# Patient Record
Sex: Male | Born: 1950
Health system: Southern US, Community
[De-identification: ages and names within clinical notes are randomized; demographics above are authoritative.]

## PROBLEM LIST (undated history)

## (undated) DIAGNOSIS — T7840XA Allergy, unspecified, initial encounter: Secondary | ICD-10-CM

## (undated) DIAGNOSIS — R062 Wheezing: Secondary | ICD-10-CM

## (undated) DIAGNOSIS — I714 Abdominal aortic aneurysm, without rupture, unspecified: Secondary | ICD-10-CM

## (undated) DIAGNOSIS — R5383 Other fatigue: Secondary | ICD-10-CM

## (undated) DIAGNOSIS — C4490 Unspecified malignant neoplasm of skin, unspecified: Secondary | ICD-10-CM

## (undated) DIAGNOSIS — R0602 Shortness of breath: Secondary | ICD-10-CM

## (undated) DIAGNOSIS — R221 Localized swelling, mass and lump, neck: Secondary | ICD-10-CM

## (undated) DIAGNOSIS — R059 Cough, unspecified: Secondary | ICD-10-CM

## (undated) DIAGNOSIS — J45909 Unspecified asthma, uncomplicated: Secondary | ICD-10-CM

## (undated) DIAGNOSIS — R05 Cough: Secondary | ICD-10-CM

## (undated) HISTORY — DX: Wheezing: R06.2

## (undated) HISTORY — DX: Other fatigue: R53.83

## (undated) HISTORY — DX: Unspecified asthma, uncomplicated: J45.909

## (undated) HISTORY — PX: MOHS SURGERY: SHX181

## (undated) HISTORY — DX: Cough, unspecified: R05.9

## (undated) HISTORY — DX: Localized swelling, mass and lump, neck: R22.1

## (undated) HISTORY — DX: Unspecified malignant neoplasm of skin, unspecified: C44.90

## (undated) HISTORY — PX: BASAL CELL CARCINOMA EXCISION: SHX1214

## (undated) HISTORY — PX: OTHER SURGICAL HISTORY: SHX169

## (undated) HISTORY — PX: CATARACT EXTRACTION: SUR2

## (undated) HISTORY — DX: Abdominal aortic aneurysm, without rupture, unspecified: I71.40

## (undated) HISTORY — DX: Shortness of breath: R06.02

## (undated) HISTORY — DX: Allergy, unspecified, initial encounter: T78.40XA

---
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
## 2000-04-21 ENCOUNTER — Other Ambulatory Visit: Admission: RE | Admit: 2000-04-21 | Discharge: 2000-04-21 | Payer: Self-pay | Admitting: Family Medicine

## 2000-06-17 ENCOUNTER — Ambulatory Visit (HOSPITAL_COMMUNITY): Admission: RE | Admit: 2000-06-17 | Discharge: 2000-06-17 | Payer: Self-pay | Admitting: Family Medicine

## 2001-09-20 ENCOUNTER — Encounter: Payer: Self-pay | Admitting: Family Medicine

## 2001-09-20 ENCOUNTER — Encounter: Admission: RE | Admit: 2001-09-20 | Discharge: 2001-09-20 | Payer: Self-pay | Admitting: Family Medicine

## 2002-02-02 ENCOUNTER — Ambulatory Visit (HOSPITAL_COMMUNITY): Admission: RE | Admit: 2002-02-02 | Discharge: 2002-02-02 | Payer: Self-pay | Admitting: Gastroenterology

## 2007-10-10 ENCOUNTER — Encounter: Admission: RE | Admit: 2007-10-10 | Discharge: 2007-10-10 | Payer: Self-pay | Admitting: Family Medicine

## 2012-08-23 ENCOUNTER — Other Ambulatory Visit: Payer: Self-pay | Admitting: Family Medicine

## 2012-10-05 ENCOUNTER — Other Ambulatory Visit: Payer: Self-pay | Admitting: Dermatology

## 2012-11-18 ENCOUNTER — Other Ambulatory Visit: Payer: Self-pay | Admitting: Dermatology

## 2013-06-07 ENCOUNTER — Other Ambulatory Visit: Payer: Self-pay | Admitting: Dermatology

## 2013-12-06 ENCOUNTER — Other Ambulatory Visit: Payer: Self-pay | Admitting: Dermatology

## 2013-12-06 DIAGNOSIS — D239 Other benign neoplasm of skin, unspecified: Secondary | ICD-10-CM | POA: Diagnosis not present

## 2013-12-06 DIAGNOSIS — Z85828 Personal history of other malignant neoplasm of skin: Secondary | ICD-10-CM | POA: Diagnosis not present

## 2013-12-06 DIAGNOSIS — D485 Neoplasm of uncertain behavior of skin: Secondary | ICD-10-CM | POA: Diagnosis not present

## 2013-12-06 DIAGNOSIS — L909 Atrophic disorder of skin, unspecified: Secondary | ICD-10-CM | POA: Diagnosis not present

## 2013-12-06 DIAGNOSIS — L570 Actinic keratosis: Secondary | ICD-10-CM | POA: Diagnosis not present

## 2013-12-06 DIAGNOSIS — L919 Hypertrophic disorder of the skin, unspecified: Secondary | ICD-10-CM | POA: Diagnosis not present

## 2013-12-21 DIAGNOSIS — D0430 Carcinoma in situ of skin of unspecified part of face: Secondary | ICD-10-CM | POA: Diagnosis not present

## 2013-12-21 DIAGNOSIS — S058X9A Other injuries of unspecified eye and orbit, initial encounter: Secondary | ICD-10-CM | POA: Diagnosis not present

## 2013-12-21 DIAGNOSIS — D0439 Carcinoma in situ of skin of other parts of face: Secondary | ICD-10-CM | POA: Diagnosis not present

## 2014-01-11 DIAGNOSIS — M25569 Pain in unspecified knee: Secondary | ICD-10-CM | POA: Diagnosis not present

## 2014-01-11 DIAGNOSIS — M1710 Unilateral primary osteoarthritis, unspecified knee: Secondary | ICD-10-CM | POA: Diagnosis not present

## 2014-01-12 ENCOUNTER — Ambulatory Visit
Admission: RE | Admit: 2014-01-12 | Discharge: 2014-01-12 | Disposition: A | Discharge status: Home / Self Care | Payer: Medicare Other | Source: Ambulatory Visit | Attending: Family Medicine | Admitting: Family Medicine

## 2014-01-12 ENCOUNTER — Other Ambulatory Visit: Payer: Self-pay | Admitting: Family Medicine

## 2014-01-12 DIAGNOSIS — R059 Cough, unspecified: Secondary | ICD-10-CM | POA: Diagnosis not present

## 2014-01-12 DIAGNOSIS — J40 Bronchitis, not specified as acute or chronic: Secondary | ICD-10-CM | POA: Diagnosis not present

## 2014-01-12 DIAGNOSIS — R05 Cough: Secondary | ICD-10-CM

## 2014-01-12 DIAGNOSIS — R42 Dizziness and giddiness: Secondary | ICD-10-CM | POA: Diagnosis not present

## 2014-04-12 DIAGNOSIS — Z125 Encounter for screening for malignant neoplasm of prostate: Secondary | ICD-10-CM | POA: Diagnosis not present

## 2014-04-12 DIAGNOSIS — Z23 Encounter for immunization: Secondary | ICD-10-CM | POA: Diagnosis not present

## 2014-04-12 DIAGNOSIS — Z131 Encounter for screening for diabetes mellitus: Secondary | ICD-10-CM | POA: Diagnosis not present

## 2014-04-12 DIAGNOSIS — Z0000 Encounter for general adult medical examination without abnormal findings: Secondary | ICD-10-CM | POA: Diagnosis not present

## 2014-04-12 DIAGNOSIS — Z136 Encounter for screening for cardiovascular disorders: Secondary | ICD-10-CM | POA: Diagnosis not present

## 2014-07-18 DIAGNOSIS — Z85828 Personal history of other malignant neoplasm of skin: Secondary | ICD-10-CM | POA: Diagnosis not present

## 2014-07-18 DIAGNOSIS — L570 Actinic keratosis: Secondary | ICD-10-CM | POA: Diagnosis not present

## 2014-07-18 DIAGNOSIS — D2371 Other benign neoplasm of skin of right lower limb, including hip: Secondary | ICD-10-CM | POA: Diagnosis not present

## 2014-07-18 DIAGNOSIS — Z808 Family history of malignant neoplasm of other organs or systems: Secondary | ICD-10-CM | POA: Diagnosis not present

## 2014-07-18 DIAGNOSIS — D239 Other benign neoplasm of skin, unspecified: Secondary | ICD-10-CM | POA: Diagnosis not present

## 2014-08-17 DIAGNOSIS — R05 Cough: Secondary | ICD-10-CM | POA: Diagnosis not present

## 2014-08-17 DIAGNOSIS — J209 Acute bronchitis, unspecified: Secondary | ICD-10-CM | POA: Diagnosis not present

## 2014-08-17 DIAGNOSIS — Z720 Tobacco use: Secondary | ICD-10-CM | POA: Diagnosis not present

## 2018-02-24 ENCOUNTER — Ambulatory Visit: Payer: Medicare Other | Admitting: Internal Medicine

## 2018-02-24 ENCOUNTER — Encounter: Payer: Self-pay | Admitting: Internal Medicine

## 2018-02-24 VITALS — BP 142/90 | HR 74 | Ht 70.0 in | Wt 186.8 lb

## 2018-02-24 DIAGNOSIS — R0609 Other forms of dyspnea: Secondary | ICD-10-CM

## 2018-02-24 DIAGNOSIS — Z129 Encounter for screening for malignant neoplasm, site unspecified: Secondary | ICD-10-CM | POA: Diagnosis not present

## 2018-02-24 DIAGNOSIS — R062 Wheezing: Secondary | ICD-10-CM

## 2018-02-24 DIAGNOSIS — Z122 Encounter for screening for malignant neoplasm of respiratory organs: Secondary | ICD-10-CM

## 2018-02-24 DIAGNOSIS — R05 Cough: Secondary | ICD-10-CM | POA: Diagnosis not present

## 2018-02-24 DIAGNOSIS — R053 Chronic cough: Secondary | ICD-10-CM

## 2018-02-24 DIAGNOSIS — F17210 Nicotine dependence, cigarettes, uncomplicated: Secondary | ICD-10-CM

## 2018-02-24 DIAGNOSIS — R0600 Dyspnea, unspecified: Secondary | ICD-10-CM

## 2018-02-24 NOTE — Patient Instructions (Addendum)
ICD-10-CM   1. Dyspnea on exertion R06.09   2. Chronic cough R05   3. Wheezing R06.2   4. Smoking greater than 40 pack years F17.210   5. Cancer screening Z12.9    Dyspnea on exertion Chronic cough Wheezing  - likely you have copd  - continue symbicort - do full PFT next few weeks to assess diagnosis and severity and see if inhalers need adjustment  Smoking greater than 40 pack years Cancer screening  - do low dose CT scan for lung cancer screening next few weeks  Followup Return next few weeks but after completing PFT and  Low Dose CT

## 2018-02-24 NOTE — Progress Notes (Signed)
Subjective:    Patient ID: Mark Tate, male    DOB: [birth year], 67 y.o.   MRN: [id number]  PCP Alroy Dust, L.Marlou Sa, MD   HPI  Mark Tate 02/24/2018  Chief Complaint  Patient presents with  . Consult    Referred by [name] due to breathing issues. Pt states he mainly becomes SOB with exertion. Denies any complaints of cough or CP.   67 year old male smoker 45 pack.  Referred by [name] who is a partner Dr. Marlou Sa [facility].  Patient's brother is a Engineer, drilling in [place], [place].  He tells me that his mom died recently and the brother spent some time with him and noticed he was short of breath with exertion.  Also had chronic cough and fatigue.  Symptoms have been going on for a few years unchanged stable insidious onset no associated chest pain or radiation.  There is significant associated wheezing.  Therefore he went and saw her primary care physician's office [name].  Was started on Symbicort may be over a week ago along with prednisone and antibiotics.  He says is given some relief.  He has been taking Symbicort twice daily.  COPD CAT score is 9 based on symptomatology but we do not know if he has COPD.  Symptom severity is rated below.  There is no chest x-ray report review.  No outside records for review.  No lab test for review.    CAT COPD Symptom & Quality of Life Score (GSK trademark) 0 is no burden. 5 is highest burden 02/24/2018   Never Cough -> Cough all the time 2  No phlegm in chest -> Chest is full of phlegm 0  No chest tightness -> Chest feels very tight 0  No dyspnea for 1 flight stairs/hill -> Very dyspneic for 1 flight of stairs 4  No limitations for ADL at home -> Very limited with ADL at home 0  Confident leaving home -> Not at all confident leaving home 0  Sleep soundly -> Do not sleep soundly because of lung condition 0  Lots of Energy -> No energy at all 3  TOTAL Score (max 40)  9        has no past medical history on file.   reports  that he has been smoking cigarettes.  He has a 45.00 pack-year smoking history. He has never used smokeless tobacco.   Not on File   There is no immunization history on file for this patient.  No family history on file.   Current Outpatient Medications:  .  amoxicillin-clavulanate (AUGMENTIN) 875-125 MG tablet, , Disp: , Rfl:  .  SYMBICORT 160-4.5 MCG/ACT inhaler, , Disp: , Rfl:     Review of Systems  Constitutional: Negative for fever and unexpected weight change.  HENT: Negative for congestion, dental problem, ear pain, nosebleeds, postnasal drip, rhinorrhea, sinus pressure, sneezing, sore throat and trouble swallowing.   Eyes: Negative for redness and itching.  Respiratory: Positive for chest tightness, shortness of breath and wheezing. Negative for cough.   Cardiovascular: Negative for palpitations and leg swelling.  Gastrointestinal: Negative for nausea and vomiting.  Genitourinary: Negative for dysuria.  Musculoskeletal: Negative for joint swelling.  Skin: Negative for rash.  Allergic/Immunologic: Negative.  Negative for environmental allergies, food allergies and immunocompromised state.  Neurological: Negative for headaches.  Hematological: Bruises/bleeds easily.  Psychiatric/Behavioral: Negative for dysphoric mood. The patient is not nervous/anxious.        Objective:   Physical Exam  Constitutional:  He is oriented to person, place, and time. He appears well-developed and well-nourished. No distress.  HENT:  Head: Normocephalic and atraumatic.  Right Ear: External ear normal.  Left Ear: External ear normal.  Mouth/Throat: Oropharynx is clear and moist. No oropharyngeal exudate.  Eyes: Pupils are equal, round, and reactive to light. Conjunctivae and EOM are normal. Right eye exhibits no discharge. Left eye exhibits no discharge. No scleral icterus.  Neck: Normal range of motion. Neck supple. No JVD present. No tracheal deviation present. No thyromegaly present.    Cardiovascular: Normal rate, regular rhythm and intact distal pulses. Exam reveals no gallop and no friction rub.  No murmur heard. Pulmonary/Chest: Effort normal and breath sounds normal. No respiratory distress. He has no wheezes. He has no rales. He exhibits no tenderness.  Abdominal: Soft. Bowel sounds are normal. He exhibits no distension and no mass. There is no tenderness. There is no rebound and no guarding.  Visceral obesity +  Musculoskeletal: Normal range of motion. He exhibits no edema or tenderness.  Lymphadenopathy:    He has no cervical adenopathy.  Neurological: He is alert and oriented to person, place, and time. He has normal reflexes. No cranial nerve deficit. Coordination normal.  Skin: Skin is warm and dry. No rash noted. He is not diaphoretic. No erythema. No pallor.  Psychiatric: He has a normal mood and affect. His behavior is normal. Judgment and thought content normal.  Nursing note and vitals reviewed.  Vitals:   02/24/18 1009  BP: (!) 142/90  Pulse: 74  SpO2: 97%  Weight: 186 lb 12.8 oz (84.7 kg)  Height: 5\' 10"  (1.778 m)    Estimated body mass index is 26.8 kg/m as calculated from the following:   Height as of this encounter: 5\' 10"  (1.778 m).   Weight as of this encounter: 186 lb 12.8 oz (84.7 kg).        Assessment & Plan:     ICD-10-CM   1. Dyspnea on exertion R06.09   2. Chronic cough R05   3. Wheezing R06.2   4. Smoking greater than 40 pack years F17.210   5. Cancer screening Z12.9       Dyspnea on exertion Chronic cough Wheezing  - likely you have copd  - continue symbicort - do full PFT next few weeks to assess diagnosis and severity and see if inhalers need adjustment  Smoking greater than 40 pack years Cancer screening #lung cancer screening I discussed screening Ct chest for early detection of lung cancer Explained that in age 41-75 and smoking history, annual low dose CT chest can pick up lung cancer early and has  potential to save lives and cure lung cancer This is similar in concept to screening mammogram, colonoscopies and pap smears I explained Ct scan is low dose radiation I explained early lung cancer asymptomatic and only way to  detect is CT  With the real advantage that early lung cancer is curable through radiation or surgery I explained CT superior to CXR I explained that false positives are present and can incur cost and workup like biopsies, additional scan but benefit outweighs risk I recommend one a year  PLAN - do low dose CT scan for lung cancer screening next few weeks; he agreed  Followup Return next few weeks but after completing PFT and  Low Dose CT    Dr. Brand Males, M.D., [facility].C.P Pulmonary and Critical Care Medicine Staff Physician, [facility] Director - Interstitial Lung Disease  Program  [place] at [place], [place], [address]  Pager: [phone], If no answer or between  15:00h - 7:00h: call [phone] Telephone: [phone]

## 2018-03-09 ENCOUNTER — Ambulatory Visit (INDEPENDENT_AMBULATORY_CARE_PROVIDER_SITE_OTHER)
Admission: RE | Admit: 2018-03-09 | Discharge: 2018-03-09 | Disposition: A | Discharge status: Home / Self Care | Payer: Medicare Other | Source: Ambulatory Visit | Attending: Internal Medicine | Admitting: Internal Medicine

## 2018-03-09 DIAGNOSIS — R0609 Other forms of dyspnea: Principal | ICD-10-CM

## 2018-03-09 DIAGNOSIS — R0600 Dyspnea, unspecified: Secondary | ICD-10-CM

## 2018-03-09 DIAGNOSIS — F17210 Nicotine dependence, cigarettes, uncomplicated: Secondary | ICD-10-CM

## 2018-03-10 ENCOUNTER — Telehealth: Payer: Self-pay | Admitting: Internal Medicine

## 2018-03-10 NOTE — Telephone Encounter (Signed)
Notes recorded by Brand Males, MD on 03/10/2018 at 5:36 AM EDT CT scan without lung cancer. Has emphysema +. Will see him with PFT in July 2019 visit ----------------------------------- Spoke with pt. He is aware of results. Nothing further was needed.

## 2018-04-14 ENCOUNTER — Ambulatory Visit (INDEPENDENT_AMBULATORY_CARE_PROVIDER_SITE_OTHER): Payer: Medicare Other | Admitting: Internal Medicine

## 2018-04-14 ENCOUNTER — Other Ambulatory Visit: Payer: Medicare Other

## 2018-04-14 ENCOUNTER — Ambulatory Visit: Payer: Medicare Other | Admitting: Internal Medicine

## 2018-04-14 ENCOUNTER — Encounter: Payer: Self-pay | Admitting: Internal Medicine

## 2018-04-14 VITALS — BP 140/88 | HR 72 | Ht 66.93 in | Wt 193.8 lb

## 2018-04-14 DIAGNOSIS — F17210 Nicotine dependence, cigarettes, uncomplicated: Secondary | ICD-10-CM | POA: Diagnosis not present

## 2018-04-14 DIAGNOSIS — J449 Chronic obstructive pulmonary disease, unspecified: Secondary | ICD-10-CM | POA: Diagnosis not present

## 2018-04-14 DIAGNOSIS — Z122 Encounter for screening for malignant neoplasm of respiratory organs: Secondary | ICD-10-CM | POA: Diagnosis not present

## 2018-04-14 DIAGNOSIS — R0609 Other forms of dyspnea: Secondary | ICD-10-CM | POA: Diagnosis not present

## 2018-04-14 DIAGNOSIS — R0600 Dyspnea, unspecified: Secondary | ICD-10-CM

## 2018-04-14 LAB — PULMONARY FUNCTION TEST
DL/VA % PRED: 61 %
DL/VA: 2.69 ml/min/mmHg/L
DLCO UNC: 16.56 ml/min/mmHg
DLCO unc % pred: 69 %
FEF 25-75 POST: 1.42 L/s
FEF 25-75 Pre: 1.35 L/sec
FEF2575-%Change-Post: 5 %
FEF2575-%PRED-POST: 63 %
FEF2575-%Pred-Pre: 60 %
FEV1-%Change-Post: 0 %
FEV1-%Pred-Post: 86 %
FEV1-%Pred-Pre: 86 %
FEV1-Post: 2.52 L
FEV1-Pre: 2.51 L
FEV1FVC-%Change-Post: -1 %
FEV1FVC-%PRED-PRE: 89 %
FEV6-%Change-Post: 2 %
FEV6-%Pred-Post: 101 %
FEV6-%Pred-Pre: 99 %
FEV6-POST: 3.79 L
FEV6-Pre: 3.71 L
FEV6FVC-%CHANGE-POST: 0 %
FEV6FVC-%Pred-Post: 103 %
FEV6FVC-%Pred-Pre: 103 %
FVC-%Change-Post: 2 %
FVC-%Pred-Post: 98 %
FVC-%Pred-Pre: 95 %
FVC-Post: 3.88 L
FVC-Pre: 3.79 L
Post FEV1/FVC ratio: 65 %
Post FEV6/FVC ratio: 98 %
Pre FEV1/FVC ratio: 66 %
Pre FEV6/FVC Ratio: 98 %
RV % pred: 23 %
RV: 0.53 L
TLC % pred: 73 %
TLC: 4.69 L

## 2018-04-14 NOTE — Progress Notes (Signed)
PFT completed today. 04/14/18  

## 2018-04-14 NOTE — Progress Notes (Signed)
Subjective:    Patient ID: Mark Tate, male    DOB: Aug 11, 1951, 67 y.o.   MRN: [id number]  PCP Mark Dust, L.Mark Sa, MD   HPI   PCP Mark Dust, L.Mark Sa, MD   HPI  Mark Tate 02/24/2018  Chief Complaint  Patient presents with  . Consult    Referred by Dr. Laurann Tate due to breathing issues. Pt states he mainly becomes SOB with exertion. Denies any complaints of cough or CP.   67 year old male smoker 45 pack.  Referred by Dr. Laurann Tate who is a partner Dr. Marlou Tate [facility] practice.  Patient's brother is a Engineer, drilling in [place], [place].  He tells me that his mom died recently and the brother spent some time with him and noticed he was short of breath with exertion.  Also had chronic cough and fatigue.  Symptoms have been going on for a few years unchanged stable insidious onset no associated chest pain or radiation.  There is significant associated wheezing.  Therefore he went and saw her primary care physician's office Dr. Laurann Tate.  Was started on Symbicort may be over a week ago along with prednisone and antibiotics.  He says is given some relief.  He has been taking Symbicort twice daily.  COPD CAT score is 9 based on symptomatology but we do not know if he has COPD.  Symptom severity is rated below.  There is no chest x-ray report review.  No outside records for review.  No lab test for review.  OV 04/14/2018  Chief Complaint  Patient presents with  . Follow-up    PFT today    Follow-up shortness of breath and coughin the setting of smoking. Here to review test results. . In the interim no new complaints. He says Symbicort works well for him. He does not want to change his inhaler to anything else. He feels symptoms are actually better compared to 02/24/2018 last saw him. He had pulmonary function test today that I personally reviewed and visualized. He is isolated reduction in diffusion capacity in the setting of Symbicort. He had a low-dose CT scan of the chest For surveillance annually  has a 2 mm right upper lobe nodule and emphysema. There are no other new findings.   CAT COPD Symptom & Quality of Life Score (GSK trademark) 0 is no burden. 5 is highest burden 02/24/2018   Never Cough -> Cough all the time 2  No phlegm in chest -> Chest is full of phlegm 0  No chest tightness -> Chest feels very tight 0  No dyspnea for 1 flight stairs/hill -> Very dyspneic for 1 flight of stairs 4  No limitations for ADL at home -> Very limited with ADL at home 0  Confident leaving home -> Not at all confident leaving home 0  Sleep soundly -> Do not sleep soundly because of lung condition 0  Lots of Energy -> No energy at all 3  TOTAL Score (max 40)  9      has no past medical history on file.   reports that he has been smoking cigarettes.  He has a 45.00 pack-year smoking history. He has never used smokeless tobacco.  No past surgical history on file.  Not on File   There is no immunization history on file for this patient.  No family history on file.   Current Outpatient Medications:  .  aspirin 81 MG tablet, Take 81 mg by mouth daily., Disp: , Rfl:  .  calcium carbonate (OS-CAL - DOSED  IN MG OF ELEMENTAL CALCIUM) 1250 (500 Ca) MG tablet, Take 1 tablet by mouth daily with breakfast., Disp: , Rfl:  .  Multiple Vitamins-Minerals (CENTRUM SILVER 50+MEN) TABS, Take 1 tablet by mouth daily., Disp: , Rfl:  .  Omega-3 Fatty Acids (FISH OIL) 1000 MG CAPS, Take 1 capsule by mouth daily., Disp: , Rfl:  .  SYMBICORT 160-4.5 MCG/ACT inhaler, , Disp: , Rfl:      Review of Systems     Objective:   Physical Exam  Constitutional: He is oriented to person, place, and time. He appears well-developed and well-nourished. No distress.  HENT:  Head: Normocephalic and atraumatic.  Right Ear: External ear normal.  Left Ear: External ear normal.  Mouth/Throat: Oropharynx is clear and moist. No oropharyngeal exudate.  Eyes: Pupils are equal, round, and reactive to light. Conjunctivae  and EOM are normal. Right eye exhibits no discharge. Left eye exhibits no discharge. No scleral icterus.  Neck: Normal range of motion. Neck supple. No JVD present. No tracheal deviation present. No thyromegaly present.  Cardiovascular: Normal rate, regular rhythm and intact distal pulses. Exam reveals no gallop and no friction rub.  No murmur heard. Pulmonary/Chest: Effort normal and breath sounds normal. No respiratory distress. He has no wheezes. He has no rales. He exhibits no tenderness.  Abdominal: Soft. Bowel sounds are normal. He exhibits no distension and no mass. There is no tenderness. There is no rebound and no guarding.  Visceral obesity +  Musculoskeletal: Normal range of motion. He exhibits no edema or tenderness.  Lymphadenopathy:    He has no cervical adenopathy.  Neurological: He is alert and oriented to person, place, and time. He has normal reflexes. No cranial nerve deficit. Coordination normal.  Skin: Skin is warm and dry. No rash noted. He is not diaphoretic. No erythema. No pallor.  Psychiatric: He has a normal mood and affect. His behavior is normal. Judgment and thought content normal.  Nursing note and vitals reviewed.   Today's Vitals   04/14/18 0943  BP: 140/88  Pulse: 72  SpO2: 96%  Weight: 193 lb 12.8 oz (87.9 kg)  Height: 5' 6.93" (1.7 m)    Estimated body mass index is 30.42 kg/m as calculated from the following:   Height as of this encounter: 5' 6.93" (1.7 m).   Weight as of this encounter: 193 lb 12.8 oz (87.9 kg).       Assessment & Plan:  Stage 1 mild COPD by GOLD classification ([place]) - new diagnosis  - mild copd but needing inhaler - discussed change symbicort to spiriva but agreed that you prefer to stay on symbicort - continue Symbicort 2 puff 2 times daily - Use albuterol as needed - Check alpha 1 antitrypsin phenotype for genetic causes of COPD  Smoking greater than 40 pack years - we discussed quitting smoking in order to protect  the lungs and heart for the future - we discussed the importance of quitting smoking but have deferred the discussion until next visit - we agree that quitting smoking can be heart  Encounter for screening for malignant neoplasm of respiratory organs - no evidence of lung cancer June2019 CT scan of the chest - revealed follow-up low-dose CT scan of the chest June 2020  Follow-up 6-9 months or sooner if needed COPD cat score at follow-up    Dr. Brand Males, M.D., [facility].C.P Pulmonary and Critical Care Medicine Staff Physician, [place] Director - Interstitial Lung Disease  Program  Pulmonary Fibrosis  [place] at [place], [place], [address]  Pager: [phone], If no answer or between  15:00h - 7:00h: call [phone] Telephone: [phone]

## 2018-04-14 NOTE — Patient Instructions (Addendum)
Stage 1 mild COPD by GOLD classification ([place])  - mild copd but needing inhaler - discussed change symbicort to spiriva but agreed that you prefer to stay on symbicort - continue Symbicort 2 puff 2 times daily - Use albuterol as needed - Check alpha 1 antitrypsin phenotype for genetic causes of COPD  Smoking greater than 40 pack years - we discussed quitting smoking in order to protect the lungs and heart for the future - we discussed the importance of quitting smoking but have deferred the discussion until next visit - we agree that quitting smoking can be heart  Encounter for screening for malignant neoplasm of respiratory organs - no evidence of lung cancer June2019 CT scan of the chest - revealed follow-up low-dose CT scan of the chest June 2020  Follow-up 6-9 months or sooner if needed COPD cat score at follow-up

## 2018-04-20 LAB — ALPHA-1 ANTITRYPSIN PHENOTYPE: A1 ANTITRYPSIN SER: 136 mg/dL (ref 83–199)

## 2018-04-28 ENCOUNTER — Telehealth: Payer: Self-pay | Admitting: Internal Medicine

## 2018-04-28 NOTE — Telephone Encounter (Signed)
Brand Males, MD sent to [place], Waldemar Dickens, CMA        Alpha 1 gene is MS - This means one gene is slightly weak in protecteing lungs but other is ok. Will discuss at followup    Spoke with pt and notified of results per Dr. Chase Caller. Pt verbalized understanding and denied any questions.

## 2018-04-28 NOTE — Progress Notes (Signed)
Spoke with pt and notified of results per Dr. Ramaswamy. Pt verbalized understanding and denied any questions.  

## 2019-04-07 ENCOUNTER — Other Ambulatory Visit: Payer: Self-pay | Admitting: *Deleted

## 2019-04-07 DIAGNOSIS — Z87891 Personal history of nicotine dependence: Secondary | ICD-10-CM

## 2019-04-07 DIAGNOSIS — Z122 Encounter for screening for malignant neoplasm of respiratory organs: Secondary | ICD-10-CM

## 2019-04-07 DIAGNOSIS — F17210 Nicotine dependence, cigarettes, uncomplicated: Secondary | ICD-10-CM

## 2019-05-23 ENCOUNTER — Encounter (INDEPENDENT_AMBULATORY_CARE_PROVIDER_SITE_OTHER): Payer: Self-pay

## 2019-05-23 ENCOUNTER — Other Ambulatory Visit: Payer: Self-pay

## 2019-05-23 ENCOUNTER — Ambulatory Visit (INDEPENDENT_AMBULATORY_CARE_PROVIDER_SITE_OTHER)
Admission: RE | Admit: 2019-05-23 | Discharge: 2019-05-23 | Disposition: A | Discharge status: Home / Self Care | Payer: Medicare Other | Source: Ambulatory Visit | Attending: Internal Medicine | Admitting: Internal Medicine

## 2019-05-23 DIAGNOSIS — F17210 Nicotine dependence, cigarettes, uncomplicated: Secondary | ICD-10-CM | POA: Diagnosis not present

## 2019-05-23 DIAGNOSIS — Z87891 Personal history of nicotine dependence: Secondary | ICD-10-CM | POA: Diagnosis not present

## 2019-05-23 DIAGNOSIS — Z122 Encounter for screening for malignant neoplasm of respiratory organs: Secondary | ICD-10-CM

## 2019-11-12 ENCOUNTER — Telehealth: Payer: Self-pay | Admitting: Internal Medicine

## 2019-11-12 DIAGNOSIS — I2584 Coronary atherosclerosis due to calcified coronary lesion: Secondary | ICD-10-CM

## 2019-11-12 DIAGNOSIS — I2510 Atherosclerotic heart disease of native coronary artery without angina pectoris: Secondary | ICD-10-CM

## 2019-11-12 NOTE — Telephone Encounter (Signed)
Last seen juluy 2019 Annual CT chest for lung cancr screen - aug 2020 - no evidence of lung cancer but there is incidental findings of - co art calcificatiopn - also there in 2019 but not clearly mentioned in final report - gall stones  - also there in 2019 but was not mentioned in final report - kidney stone - also there in 2019 but not mentioned in final reort  Plan  - apologize for significant delay in this result on my behalf - do next ct chest wo contrast in august/seept 2021 - regarding   coronary artery calcification  - if no normal cardiac stress test past few years; please refer to cardiologist - [place] or [place], first available  - regarding gallstones, kidney stones -> talk to PCP [place], L.Marlou Sa, MD - likely only observation because been there long tiime

## 2019-11-13 NOTE — Telephone Encounter (Signed)
I called and spoke with the pt and notified of results and recommendations per MR  He verbalized understanding  Has never seen cards  Referral was made

## 2019-11-16 ENCOUNTER — Ambulatory Visit: Payer: Medicare Other | Admitting: Cardiovascular Disease

## 2019-11-30 ENCOUNTER — Ambulatory Visit: Payer: Medicare Other | Attending: Internal Medicine

## 2019-11-30 DIAGNOSIS — Z23 Encounter for immunization: Secondary | ICD-10-CM

## 2019-11-30 NOTE — Progress Notes (Signed)
   Covid-19 Vaccination Clinic  Name:  Mark Tate    MRN: [id number] DOB: 05-05-1951  11/30/2019  [name] was observed post Covid-19 immunization for 15 minutes without incident. He was provided with Vaccine Information Sheet and instruction to access the V-Safe system.   [name] was instructed to call 911 with any severe reactions post vaccine: Marland Kitchen Difficulty breathing  . Swelling of face and throat  . A fast heartbeat  . A bad rash all over body  . Dizziness and weakness   Immunizations Administered    Name Date Dose VIS Date Route   Pfizer COVID-19 Vaccine 11/30/2019  9:00 AM 0.3 mL 09/08/2019 Intramuscular   Manufacturer: [place]   Lot: [id number]   [place]: [id number]

## 2019-12-05 NOTE — Progress Notes (Signed)
CARDIOLOGY CONSULT NOTE       Patient ID: Mark Tate MRN: [id number] DOB/AGE: 1951-04-30 69 y.o.  Admit date: (Not on file) Referring Physician: Marlou Sa Primary Physician: Alroy Dust, L.Marlou Sa, MD Primary Cardiologist: New Reason for Consultation: CAD  Active Problems:   * No active hospital problems. *   HPI:  69 y.o. referred by Dr Marlou Sa for CAD. Smoker with 45 pack year history Seen by pulmonary Ramaswamy Rx with spiriva and symbicort Lung cancer screening CT done 05/23/19 no cancer Commented on LM coronary calcification and aortic atherosclerosis with focal dilatation of the descending thoracic aorta 4.3 cm moderate bullous emphysema This was also noted on my review of CT done 03/09/18 The calcification is very mild with no other calcium noted in the coronary arteries to my review  He has no exertional chest pain Has never had stress testing some exertional dyspnea from COPD Has had COVID vaccine Has two daughters from two marriages and 10 grand children. 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   ROS All other systems reviewed and negative except as noted above  Past Medical History:  Diagnosis Date  . Allergies   . Asthma   . COPD (chronic obstructive pulmonary disease) ([place])   . Coughing   . Fatigue   . Neck swelling   . Skin cancer   . SOB (shortness of breath) on exertion   . Wheezing     Family History  Problem Relation Age of Onset  . Cancer Mother        BONE, LUNG  . Cancer Father        COLON, PROSTATE  . Cancer - Lung Maternal Aunt     Social History   Socioeconomic History  . Marital status: Married    Spouse name: Not on file  . Number of children: Not on file  . Years of education: Not on file  . Highest education level: Not on file  Occupational History  . Not on file  Tobacco Use  . Smoking status: Current Every Day Smoker    Packs/day: 1.00    Years: 45.00    Pack years: 45.00    Types: Cigarettes  . Smokeless tobacco: Never Used    Substance and Sexual Activity  . Alcohol use: Not on file  . Drug use: Not on file  . Sexual activity: Not on file  Other Topics Concern  . Not on file  Social History Narrative  . Not on file   Social Determinants of Health   Financial Resource Strain:   . Difficulty of Paying Living Expenses:   Food Insecurity:   . Worried About Charity fundraiser in the Last Year:   . Arboriculturist in the Last Year:   Transportation Needs:   . Film/video editor (Medical):   Marland Kitchen Lack of Transportation (Non-Medical):   Physical Activity:   . Days of Exercise per Week:   . Minutes of Exercise per Session:   Stress:   . Feeling of Stress :   Social Connections:   . Frequency of Communication with Friends and Family:   . Frequency of Social Gatherings with Friends and Family:   . Attends Religious Services:   . Active Member of Clubs or Organizations:   . Attends Archivist Meetings:   Marland Kitchen Marital Status:   Intimate Partner Violence:   . Fear of Current or Ex-Partner:   . Emotionally Abused:   Marland Kitchen Physically Abused:   .  Sexually Abused:     Past Surgical History:  Procedure Laterality Date  . BASAL CELL CARCINOMA EXCISION     NECK  . CATARACT EXTRACTION    . MOHS SURGERY  2013, 2019   ON SCALP AND LEFT ARM       Current Outpatient Medications:  .  acetaminophen (TYLENOL) 500 MG tablet, Take 500 mg by mouth every 6 (six) hours as needed., Disp: , Rfl:  .  albuterol (VENTOLIN HFA) 108 (90 Base) MCG/ACT inhaler, Inhale 1 puff into the lungs every 4 (four) hours as needed for wheezing or shortness of breath., Disp: , Rfl:  .  aspirin 81 MG tablet, Take 81 mg by mouth daily., Disp: , Rfl:  .  calcium carbonate (OS-CAL - DOSED IN MG OF ELEMENTAL CALCIUM) 1250 (500 Ca) MG tablet, Take 1 tablet by mouth daily with breakfast., Disp: , Rfl:  .  cetirizine (ZYRTEC) 10 MG tablet, Take 10 mg by mouth daily., Disp: , Rfl:  .  diclofenac (VOLTAREN) 75 MG EC tablet, Take 75 mg by  mouth daily as needed., Disp: , Rfl:  .  GLUCOSAMINE CHONDROITIN COMPLX PO, Take by mouth daily., Disp: , Rfl:  .  Multiple Vitamins-Minerals (CENTRUM SILVER 50+MEN) TABS, Take 1 tablet by mouth daily., Disp: , Rfl:  .  Omega-3 Fatty Acids (FISH OIL) 1000 MG CAPS, Take 1 capsule by mouth daily., Disp: , Rfl:  .  SYMBICORT 160-4.5 MCG/ACT inhaler, , Disp: , Rfl:  .  VITAMIN D PO, Take 1,000 Units by mouth daily., Disp: , Rfl:     Physical Exam: Blood pressure (!) 162/94, pulse 75, height 5\' 6"  (1.676 m), weight 188 lb (85.3 kg), SpO2 97 %.    Affect appropriate Elderly white male  HEENT: normal Neck supple with no adenopathy JVP normal no bruits no thyromegaly Lungs emphysema with mild end expiratory wheezing  Heart:  S1/S2 no murmur, no rub, gallop or click PMI normal Abdomen: benighn, BS positve, no tenderness, no AAA no bruit.  No HSM or HJR Distal pulses intact with no bruits No edema Neuro non-focal Skin warm and dry No muscular weakness    Radiology:  See HPI regarding lung cancer screening CT;s 03/09/18 and 05/23/19   EKG: SR rate 75 nonspecific ST changes    ASSESSMENT AND PLAN:   1. Dyspnea:  Likely related to bullous emphysema and smoking F/U with [place] pulmonary discussed smoking cessation Continue inhalers   2. CAD:  Seen on CT done for lung cancer screening Isolated punctate area seen in distal LM and no where else will order lexiscan myovue to risk stratify for obstructive disease   Signed: Jenkins Rouge 12/19/2019, 9:50 AM

## 2019-12-19 ENCOUNTER — Other Ambulatory Visit: Payer: Self-pay

## 2019-12-19 ENCOUNTER — Ambulatory Visit: Payer: Medicare Other | Admitting: Cardiovascular Disease

## 2019-12-19 ENCOUNTER — Encounter: Payer: Self-pay | Admitting: Cardiovascular Disease

## 2019-12-19 ENCOUNTER — Telehealth (HOSPITAL_COMMUNITY): Payer: Self-pay

## 2019-12-19 VITALS — BP 162/94 | HR 75 | Ht 66.0 in | Wt 188.0 lb

## 2019-12-19 DIAGNOSIS — I2510 Atherosclerotic heart disease of native coronary artery without angina pectoris: Secondary | ICD-10-CM | POA: Diagnosis not present

## 2019-12-19 NOTE — Telephone Encounter (Signed)
Detailed instructions left for the patient on his answering machine per his DPR. Asked to call back with any questions. S.Aleksei Goodlin EMTP

## 2019-12-19 NOTE — Patient Instructions (Addendum)
Medication Instructions:  *If you need a refill on your cardiac medications before your next appointment, please call your pharmacy*  Lab Work: If you have labs (blood work) drawn today and your tests are completely normal, you will receive your results only by: . MyChart Message (if you have MyChart) OR . A paper copy in the mail If you have any lab test that is abnormal or we need to change your treatment, we will call you to review the results.  Testing/Procedures: Your physician has requested that you have a lexiscan myoview. For further information please visit [email or web address]. Please follow instruction sheet, as given.  Follow-Up: At [facility], you and your health needs are our priority.  As part of our continuing mission to provide you with exceptional heart care, we have created designated Provider Care Teams.  These Care Teams include your primary Cardiologist (physician) and Advanced Practice Providers (APPs -  Physician Assistants and Nurse Practitioners) who all work together to provide you with the care you need, when you need it.  We recommend signing up for the patient portal called "MyChart".  Sign up information is provided on this After Visit Summary.  MyChart is used to connect with patients for Virtual Visits (Telemedicine).  Patients are able to view lab/test results, encounter notes, upcoming appointments, etc.  Non-urgent messages can be sent to your provider as well.   To learn more about what you can do with MyChart, go to [email or web address].    Your next appointment:   As needed  The format for your next appointment:   In Person  Provider:   You may see Dr. Nishan or one of the following Advanced Practice Providers on your designated Care Team:    Lori Gerhardt, NP  Laura Ingold, NP  Jill McDaniel, NP    

## 2019-12-21 ENCOUNTER — Other Ambulatory Visit: Payer: Self-pay

## 2019-12-21 ENCOUNTER — Ambulatory Visit (HOSPITAL_COMMUNITY): Payer: Medicare Other | Attending: Cardiology

## 2019-12-21 DIAGNOSIS — I2510 Atherosclerotic heart disease of native coronary artery without angina pectoris: Secondary | ICD-10-CM | POA: Insufficient documentation

## 2019-12-21 LAB — MYOCARDIAL PERFUSION IMAGING
LV dias vol: 115 mL (ref 62–150)
LV sys vol: 62 mL
Peak HR: 98 {beats}/min
Rest HR: 68 {beats}/min
SDS: 0
SRS: 0
SSS: 0
TID: 0.91

## 2019-12-21 MED ORDER — TECHNETIUM TC 99M TETROFOSMIN IV KIT
10.9000 | PACK | Freq: Once | INTRAVENOUS | Status: AC | PRN
  Administered 2019-12-21: 10.9 via INTRAVENOUS
  Filled 2019-12-21: qty 11

## 2019-12-21 MED ORDER — REGADENOSON 0.4 MG/5ML IV SOLN
0.4000 mg | Freq: Once | INTRAVENOUS | Status: AC
  Administered 2019-12-21: 0.4 mg via INTRAVENOUS

## 2019-12-21 MED ORDER — TECHNETIUM TC 99M TETROFOSMIN IV KIT
31.9000 | PACK | Freq: Once | INTRAVENOUS | Status: AC | PRN
  Administered 2019-12-21: 31.9 via INTRAVENOUS
  Filled 2019-12-21: qty 32

## 2019-12-22 ENCOUNTER — Telehealth: Payer: Self-pay

## 2019-12-22 DIAGNOSIS — R931 Abnormal findings on diagnostic imaging of heart and coronary circulation: Secondary | ICD-10-CM

## 2019-12-22 NOTE — Telephone Encounter (Signed)
-----   Message from [name], MD sent at 12/22/2019  8:25 AM EDT ----- Myovue suggests old infarct f/u echo for EF and RWMA then see me or PA in f/u

## 2019-12-22 NOTE — Telephone Encounter (Signed)
Called patient about his test results. Patient is going to have echo on 01/01/20, first available and a virtual visit with Cecilie Kicks NP on 01/02/20.     Virtual Visit Pre-Appointment Phone Call  "(Name), I am calling you today to discuss your upcoming appointment. We are currently trying to limit exposure to the virus that causes COVID-19 by seeing patients at home rather than in the office."  1. "What is the BEST phone number to call the day of the visit?" - include this in appointment notes  2. "Do you have or have access to (through a family member/friend) a smartphone with video capability that we can use for your visit?" a. If yes - list this number in appt notes as "cell" (if different from BEST phone #) and list the appointment type as a VIDEO visit in appointment notes b. If no - list the appointment type as a PHONE visit in appointment notes  3. Confirm consent - "In the setting of the current Covid19 crisis, you are scheduled for a (phone or video) visit with your provider on (date) at (time).  Just as we do with many in-office visits, in order for you to participate in this visit, we must obtain consent.  If you'd like, I can send this to your mychart (if signed up) or email for you to review.  Otherwise, I can obtain your verbal consent now.  All virtual visits are billed to your insurance company just like a normal visit would be.  By agreeing to a virtual visit, we'd like you to understand that the technology does not allow for your provider to perform an examination, and thus may limit your provider's ability to fully assess your condition. If your provider identifies any concerns that need to be evaluated in person, we will make arrangements to do so.  Finally, though the technology is pretty good, we cannot assure that it will always work on either your or our end, and in the setting of a video visit, we may have to convert it to a phone-only visit.  In either situation, we cannot  ensure that we have a secure connection.  Are you willing to proceed? Yes  4. Advise patient to be prepared - "Two hours prior to your appointment, go ahead and check your blood pressure, pulse, oxygen saturation, and your weight (if you have the equipment to check those) and write them all down. When your visit starts, your provider will ask you for this information. If you have an Apple Watch or Kardia device, please plan to have heart rate information ready on the day of your appointment. Please have a pen and paper handy nearby the day of the visit as well."  5. Give patient instructions for MyChart download to smartphone OR Doximity/Doxy.me as below if video visit (depending on what platform provider is using)  6. Inform patient they will receive a phone call 15 minutes prior to their appointment time (may be from unknown caller ID) so they should be prepared to answer    TELEPHONE CALL NOTE  Mark Tate has been deemed a candidate for a follow-up tele-health visit to limit community exposure during the Covid-19 pandemic. I spoke with the patient via phone to ensure availability of phone/video source, confirm preferred email & phone number, and discuss instructions and expectations.  I reminded Mark Tate to be prepared with any vital sign and/or heart rhythm information that could potentially be obtained via home monitoring, at the time  of his visit. I reminded Mark Tate to expect a phone call prior to his visit.  Michaelyn Barter, RN 12/22/2019 9:47 AM   INSTRUCTIONS FOR DOWNLOADING THE MYCHART APP TO SMARTPHONE  - The patient must first make sure to have activated MyChart and know their login information - If Apple, go to [employer] and type in MyChart in the search bar and download the app. If Android, ask patient to go to [employer] and type in [place] in the search bar and download the app. The app is free but as with any other app downloads, their phone may require  them to verify saved payment information or Apple/Android password.  - The patient will need to then log into the app with their MyChart username and password, and select [facility] as their healthcare provider to link the account. When it is time for your visit, go to the MyChart app, find appointments, and click Begin Video Visit. Be sure to Select Allow for your device to access the Microphone and Camera for your visit. You will then be connected, and your provider will be with you shortly.  **If they have any issues connecting, or need assistance please contact MyChart service desk [phone])**  **If using a computer, in order to ensure the best quality for their visit they will need to use either of the following Internet Browsers: [employer], or Google Chrome**  IF USING DOXIMITY or DOXY.ME - The patient will receive a link just prior to their visit by text.     FULL LENGTH CONSENT FOR TELE-HEALTH VISIT   I hereby voluntarily request, consent and authorize [facility] and its employed or contracted physicians, physician assistants, nurse practitioners or other licensed health care professionals (the Practitioner), to provide me with telemedicine health care services (the "Services") as deemed necessary by the treating Practitioner. I acknowledge and consent to receive the Services by the Practitioner via telemedicine. I understand that the telemedicine visit will involve communicating with the Practitioner through live audiovisual communication technology and the disclosure of certain medical information by electronic transmission. I acknowledge that I have been given the opportunity to request an in-person assessment or other available alternative prior to the telemedicine visit and am voluntarily participating in the telemedicine visit.  I understand that I have the right to withhold or withdraw my consent to the use of telemedicine in the course of my care at any  time, without affecting my right to future care or treatment, and that the Practitioner or I may terminate the telemedicine visit at any time. I understand that I have the right to inspect all information obtained and/or recorded in the course of the telemedicine visit and may receive copies of available information for a reasonable fee.  I understand that some of the potential risks of receiving the Services via telemedicine include:  Marland Kitchen Delay or interruption in medical evaluation due to technological equipment failure or disruption; . Information transmitted may not be sufficient (e.g. poor resolution of images) to allow for appropriate medical decision making by the Practitioner; and/or  . In rare instances, security protocols could fail, causing a breach of personal health information.  Furthermore, I acknowledge that it is my responsibility to provide information about my medical history, conditions and care that is complete and accurate to the best of my ability. I acknowledge that Practitioner's advice, recommendations, and/or decision may be based on factors not within their control, such as incomplete or inaccurate data provided  by me or distortions of diagnostic images or specimens that may result from electronic transmissions. I understand that the practice of medicine is not an exact science and that Practitioner makes no warranties or guarantees regarding treatment outcomes. I acknowledge that I will receive a copy of this consent concurrently upon execution via email to the email address I last provided but may also request a printed copy by calling the office of [facility].    I understand that my insurance will be billed for this visit.   I have read or had this consent read to me. . I understand the contents of this consent, which adequately explains the benefits and risks of the Services being provided via telemedicine.  . I have been provided ample opportunity to ask questions  regarding this consent and the Services and have had my questions answered to my satisfaction. . I give my informed consent for the services to be provided through the use of telemedicine in my medical care  By participating in this telemedicine visit I agree to the above.

## 2019-12-26 ENCOUNTER — Ambulatory Visit: Payer: Medicare Other | Attending: Internal Medicine

## 2019-12-26 DIAGNOSIS — Z23 Encounter for immunization: Secondary | ICD-10-CM

## 2019-12-26 NOTE — Progress Notes (Signed)
   Covid-19 Vaccination Clinic  Name:  Mark Tate    MRN: [id number] DOB: August 22, 1951  12/26/2019  [name] was observed post Covid-19 immunization for 15 minutes without incident. He was provided with Vaccine Information Sheet and instruction to access the V-Safe system.   [name] was instructed to call 911 with any severe reactions post vaccine: Marland Kitchen Difficulty breathing  . Swelling of face and throat  . A fast heartbeat  . A bad rash all over body  . Dizziness and weakness   Immunizations Administered    Name Date Dose VIS Date Route   Pfizer COVID-19 Vaccine 12/26/2019  1:32 PM 0.3 mL 09/08/2019 Intramuscular   Manufacturer: [place]   Lot: [id number]   [place]: [id number]

## 2020-01-01 ENCOUNTER — Telehealth: Payer: Self-pay

## 2020-01-01 ENCOUNTER — Ambulatory Visit (HOSPITAL_COMMUNITY): Payer: Medicare Other | Attending: Cardiovascular Disease

## 2020-01-01 ENCOUNTER — Other Ambulatory Visit: Payer: Self-pay

## 2020-01-01 DIAGNOSIS — R931 Abnormal findings on diagnostic imaging of heart and coronary circulation: Secondary | ICD-10-CM | POA: Diagnosis not present

## 2020-01-01 DIAGNOSIS — I2510 Atherosclerotic heart disease of native coronary artery without angina pectoris: Secondary | ICD-10-CM | POA: Diagnosis not present

## 2020-01-01 DIAGNOSIS — I517 Cardiomegaly: Secondary | ICD-10-CM | POA: Insufficient documentation

## 2020-01-01 DIAGNOSIS — J449 Chronic obstructive pulmonary disease, unspecified: Secondary | ICD-10-CM | POA: Insufficient documentation

## 2020-01-01 DIAGNOSIS — I77810 Thoracic aortic ectasia: Secondary | ICD-10-CM | POA: Diagnosis not present

## 2020-01-01 DIAGNOSIS — Z79899 Other long term (current) drug therapy: Secondary | ICD-10-CM

## 2020-01-01 MED ORDER — CARVEDILOL 3.125 MG PO TABS: 3.1250 mg | ORAL_TABLET | Freq: Two times a day (BID) | ORAL | 3 refills | Status: DC

## 2020-01-01 MED ORDER — ENTRESTO 24-26 MG PO TABS: 1.0000 | ORAL_TABLET | Freq: Two times a day (BID) | ORAL | 11 refills | Status: DC

## 2020-01-01 NOTE — Addendum Note (Signed)
Addended by: Aris Georgia, Klayten Jolliff L on: 01/01/2020 12:16 PM   Modules accepted: Orders

## 2020-01-01 NOTE — Telephone Encounter (Addendum)
Patient aware of results. Will send entresto and coreg to patient's pharmacy. Patient has virtual appointment tomorrow with Cecilie Kicks NP. Will have patient keep appointment to go over questions and concerns. Patient will still come up to the office for lab work BMET and BNP, and he will need to pick up entresto card for free 30 days. Scheduled patient for 01/22/20 to see Pharmacy and get lab work done. Patient has appointment on 02/12/20 for office visit with Cecilie Kicks NP to discuss heart cath. Will leave letter at front desk with entresto card and a letter with all his appointment on it.

## 2020-01-01 NOTE — Telephone Encounter (Signed)
-----   Message from [name], MD sent at 01/01/2020 11:12 AM EDT ----- EF down will need right and left cath after on medical Rx start low dose entresto and coreg 3.125 bid f/u pharm D to titrate in 3 weeks f/u with me or PA 6 weeks to discuss cath Needs BMET and BNP now and in 3 weeks

## 2020-01-01 NOTE — Progress Notes (Signed)
Virtual Visit via Video Note   This visit type was conducted due to national recommendations for restrictions regarding the COVID-19 Pandemic (e.g. social distancing) in an effort to limit this patient's exposure and mitigate transmission in our community.  Due to his co-morbid illnesses, this patient is at least at moderate risk for complications without adequate follow up.  This format is felt to be most appropriate for this patient at this time.  All issues noted in this document were discussed and addressed.  A limited physical exam was performed with this format.  Please refer to the patient's chart for his consent to telehealth for [facility]   The patient was identified using 2 identifiers.  Date:  01/01/2020   ID:  [name], DOB June 01, 1951, MRN [id number]  Patient Location: Other:  family house Provider Location: Office  PCP:  Alroy Dust, Carlean Jews.Marlou Sa, MD  Cardiologist:  Jenkins Rouge, MD  Electrophysiologist:  None   Evaluation Performed:  Follow-Up Visit  Chief Complaint:  Cardiomyopathy   History of Present Illness:    ORIE WORSTELL is a 69 y.o. male with CAD on CT of chest. Smoker with 45 pack year history Seen by pulmonary Ramaswamy Rx with spiriva and symbicort Lung cancer screening CT done 05/23/19 no cancer Commented on LM coronary calcification and aortic atherosclerosis with focal dilatation of the descending thoracic aorta 4.3 cm moderate bullous emphysema This was also noted on my review of CT done 03/09/18 The calcification is very mild with no other calcium noted in the coronary arteries to my review  He has no exertional chest pain Has never had stress testing some exertional dyspnea from COPD Has had COVID vaccine Has two daughters from two marriages and 10 grand children. 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    Asked to stop tobacco.   CAD seen on CT for lung cancer screening.  Only in distal LM, stress test ordered. Echo also  myoview suggest old  infarct  Echo with decreased, EF to 30-35%, G1 DD   Plan per Dr. Doreen Beam entrestro and coreg and increase in 3 weeks, follow up in 6 weeks to discuss Rt and Lt cardiac cath.   Aortic dilatation noted of ascending aorta at 39 mm  Discussed with pt and his sister was present for call. His results and plan to improve EF.  Discussed meds and eventual need for cath.   All questions answered. Pt has entresto and coreg at home.    The patient does not have symptoms concerning for COVID-19 infection (fever, chills, cough, or new shortness of breath).    Past Medical History:  Diagnosis Date  . Allergies   . Asthma   . COPD (chronic obstructive pulmonary disease) ([place])   . Coughing   . Fatigue   . Neck swelling   . Skin cancer   . SOB (shortness of breath) on exertion   . Wheezing    Past Surgical History:  Procedure Laterality Date  . BASAL CELL CARCINOMA EXCISION     NECK  . CATARACT EXTRACTION    . MOHS SURGERY  2013, 2019   ON SCALP AND LEFT ARM      No outpatient medications have been marked as taking for the 01/02/20 encounter (Appointment) with [name], NP.     Allergies:   Patient has no known allergies.   Social History   Tobacco Use  . Smoking status: Current Every Day Smoker    Packs/day: 1.00  Years: 45.00    Pack years: 45.00    Types: Cigarettes  . Smokeless tobacco: Never Used  Substance Use Topics  . Alcohol use: Not on file  . Drug use: Not on file     Family Hx: The patient's family history includes Cancer in his father and mother; Cancer - Lung in his maternal aunt.  ROS:   Please see the history of present illness.    General:no colds or fevers, no weight changes Skin:no rashes or ulcers HEENT:no blurred vision, no congestion CV:see HPI PUL:see HPI GI:no diarrhea constipation or melena, no indigestion GU:no hematuria, no dysuria MS:no joint pain, no claudication Neuro:no syncope, no lightheadedness Endo:no diabetes, no thyroid  disease  All other systems reviewed and are negative.   Prior CV studies:   The following studies were reviewed today: ECHO 01/01/20 IMPRESSIONS    1. Left ventricular ejection fraction, by estimation, is 30 to 35%. The  left ventricle has moderately decreased function. The left ventricle  demonstrates global hypokinesis. The left ventricular internal cavity size  was mildly dilated. Left ventricular  diastolic parameters are consistent with Grade I diastolic dysfunction  (impaired relaxation).  2. Right ventricular systolic function is normal. The right ventricular  size is normal.  3. The mitral valve is normal in structure. No evidence of mitral valve  regurgitation. No evidence of mitral stenosis.  4. The aortic valve is normal in structure. Aortic valve regurgitation is  not visualized. No aortic stenosis is present.  5. Aortic dilatation noted. There is mild dilatation of the ascending  aorta measuring 39 mm.   FINDINGS  Left Ventricle: Left ventricular ejection fraction, by estimation, is 30  to 35%. The left ventricle has moderately decreased function. The left  ventricle demonstrates global hypokinesis. The left ventricular internal  cavity size was mildly dilated.  There is no left ventricular hypertrophy. Left ventricular diastolic  parameters are consistent with Grade I diastolic dysfunction (impaired  relaxation).   Right Ventricle: The right ventricular size is normal. No increase in  right ventricular wall thickness. Right ventricular systolic function is  normal.   Left Atrium: Left atrial size was normal in size.   Right Atrium: Right atrial size was normal in size.   Pericardium: There is no evidence of pericardial effusion.   Mitral Valve: The mitral valve is normal in structure. No evidence of  mitral valve regurgitation. No evidence of mitral valve stenosis.   Tricuspid Valve: The tricuspid valve is grossly normal. Tricuspid valve    regurgitation is trivial. No evidence of tricuspid stenosis.   Aortic Valve: The aortic valve is normal in structure. Aortic valve  regurgitation is not visualized. No aortic stenosis is present.   Pulmonic Valve: The pulmonic valve was normal in structure. Pulmonic valve  regurgitation is not visualized. No evidence of pulmonic stenosis.   Aorta: Aortic dilatation noted. There is mild dilatation of the ascending  aorta measuring 39 mm.   IAS/Shunts: The atrial septum is grossly normal.   NUC study 12/20/19  There was no ST segment deviation noted during stress.  Hypertensive with resting BP 200/114  The left ventricular ejection fraction is mildly decreased (45-54%).  Nuclear stress EF: 46%.  Defect 1: There is a small defect of mild severity present in the apex location.  Defect 2: There is a small defect of mild severity present in the basal inferior, mid inferior and apical inferior location.  Findings consistent with prior myocardial infarction.  This is an intermediate risk  study due to decreased systolic function. No ischemia.   Fixed perfusion defects at apex and inferior wall suggestive of prior infarct.  No ischemia.   Labs/Other Tests and Data Reviewed:    EKG:  An ECG dated 12/19/19 was personally reviewed today and demonstrated:  SR at 53 and no acute ST changes  Recent Labs: No results found for requested labs within last 8760 hours.   Recent Lipid Panel No results found for: CHOL, TRIG, HDL, CHOLHDL, LDLCALC, LDLDIRECT  Wt Readings from Last 3 Encounters:  12/21/19 188 lb (85.3 kg)  12/19/19 188 lb (85.3 kg)  04/14/18 193 lb 12.8 oz (87.9 kg)     Objective:    Vital Signs:  There were no vitals taken for this visit.   VITAL SIGNS:  reviewed Neuro alert and oriented and answers questions approp Pulmonary can speak in complete sentences without SOB General NAD  ASSESSMENT & PLAN:    1. Cardiomyopathy on Echo with EF 30-35%; dilated LV,  G1DD--add entresto and coreg today check BMP and BNP.  Return in 2 -3 weeks for labs and increase entresto,  In 6 weeks return to discuss Lt and Rt cardiac cath. 2.  CAD on CT of chest - nuc study with no ischemia but old scar - concerning for old MI, on CT distal LAD disease- will plan cath once titrated meds. Will add ASA 81 mg and statin 3. Tobacco use, he understands he needs to stop will begin cutting back 4. COPD/Dyspnea followed by Pulmonary   COVID-19 Education: The signs and symptoms of COVID-19 were discussed with the patient and how to seek care for testing (follow up with PCP or arrange E-visit).  The importance of social distancing was discussed today.  Time:   Today, I have spent 8 minutes with the patient with telehealth technology discussing the above problems.     Medication Adjustments/Labs and Tests Ordered: Current medicines are reviewed at length with the patient today.  Concerns regarding medicines are outlined above.   Tests Ordered: No orders of the defined types were placed in this encounter.   Medication Changes: No orders of the defined types were placed in this encounter.   Follow Up:  In Person in 3 week(s)  And 6 weeks with Dr. Johnsie Cancel or APP  Signed, Cecilie Kicks, NP  01/01/2020 10:23 PM    [place]

## 2020-01-02 ENCOUNTER — Other Ambulatory Visit: Payer: Medicare Other

## 2020-01-02 ENCOUNTER — Encounter: Payer: Self-pay | Admitting: Cardiology

## 2020-01-02 ENCOUNTER — Telehealth (INDEPENDENT_AMBULATORY_CARE_PROVIDER_SITE_OTHER): Payer: Medicare Other | Admitting: Cardiology

## 2020-01-02 VITALS — Ht 66.0 in | Wt 188.0 lb

## 2020-01-02 DIAGNOSIS — I420 Dilated cardiomyopathy: Secondary | ICD-10-CM

## 2020-01-02 DIAGNOSIS — I2510 Atherosclerotic heart disease of native coronary artery without angina pectoris: Secondary | ICD-10-CM

## 2020-01-02 DIAGNOSIS — Z79899 Other long term (current) drug therapy: Secondary | ICD-10-CM

## 2020-01-02 DIAGNOSIS — J449 Chronic obstructive pulmonary disease, unspecified: Secondary | ICD-10-CM | POA: Diagnosis not present

## 2020-01-02 DIAGNOSIS — Z720 Tobacco use: Secondary | ICD-10-CM | POA: Diagnosis not present

## 2020-01-02 DIAGNOSIS — R931 Abnormal findings on diagnostic imaging of heart and coronary circulation: Secondary | ICD-10-CM

## 2020-01-02 NOTE — Patient Instructions (Signed)
Medication Instructions:  Your physician recommends that you continue on your current medications as directed. Please refer to the Current Medication list given to you today.  *If you need a refill on your cardiac medications before your next appointment, please call your pharmacy*   Lab Work: TODAY:  COME TO THE OFFICE FOR LAB 4/26:  COME BACK TO THE OFFICE FOR LAB  If you have labs (blood work) drawn today and your tests are completely normal, you will receive your results only by: Marland Kitchen MyChart Message (if you have MyChart) OR . A paper copy in the mail If you have any lab test that is abnormal or we need to change your treatment, we will call you to review the results.   Testing/Procedures: None ordered   Follow-Up: At [facility], you and your health needs are our priority.  As part of our continuing mission to provide you with exceptional heart care, we have created designated Provider Care Teams.  These Care Teams include your primary Cardiologist (physician) and Advanced Practice Providers (APPs -  Physician Assistants and Nurse Practitioners) who all work together to provide you with the care you need, when you need it.  We recommend signing up for the patient portal called "MyChart".  Sign up information is provided on this After Visit Summary.  MyChart is used to connect with patients for Virtual Visits (Telemedicine).  Patients are able to view lab/test results, encounter notes, upcoming appointments, etc.  Non-urgent messages can be sent to your provider as well.   To learn more about what you can do with MyChart, go to [email or web address].    Your next appointment:   You have several appointments already scheduled.  PLEASE KEEP THEM ALL

## 2020-01-03 ENCOUNTER — Encounter: Payer: Self-pay | Admitting: Cardiology

## 2020-01-03 DIAGNOSIS — Z87891 Personal history of nicotine dependence: Secondary | ICD-10-CM | POA: Insufficient documentation

## 2020-01-03 DIAGNOSIS — I420 Dilated cardiomyopathy: Secondary | ICD-10-CM | POA: Insufficient documentation

## 2020-01-03 DIAGNOSIS — I2510 Atherosclerotic heart disease of native coronary artery without angina pectoris: Secondary | ICD-10-CM

## 2020-01-03 DIAGNOSIS — J449 Chronic obstructive pulmonary disease, unspecified: Secondary | ICD-10-CM | POA: Insufficient documentation

## 2020-01-03 DIAGNOSIS — Z720 Tobacco use: Secondary | ICD-10-CM

## 2020-01-03 HISTORY — DX: Atherosclerotic heart disease of native coronary artery without angina pectoris: I25.10

## 2020-01-03 HISTORY — DX: Chronic obstructive pulmonary disease, unspecified: J44.9

## 2020-01-03 HISTORY — DX: Tobacco use: Z72.0

## 2020-01-03 LAB — BASIC METABOLIC PANEL
BUN/Creatinine Ratio: 11 (ref 10–24)
BUN: 13 mg/dL (ref 8–27)
CO2: 21 mmol/L (ref 20–29)
Calcium: 9.4 mg/dL (ref 8.6–10.2)
Chloride: 103 mmol/L (ref 96–106)
Creatinine, Ser: 1.18 mg/dL (ref 0.76–1.27)
GFR calc Af Amer: 71 mL/min/{1.73_m2} (ref >59)
GFR calc non Af Amer: 62 mL/min/{1.73_m2} (ref >59)
Glucose: 96 mg/dL (ref 65–99)
Potassium: 4.6 mmol/L (ref 3.5–5.2)
Sodium: 140 mmol/L (ref 134–144)

## 2020-01-03 LAB — PRO B NATRIURETIC PEPTIDE: NT-Pro BNP: 218 pg/mL (ref 0–376)

## 2020-01-04 ENCOUNTER — Telehealth: Payer: Self-pay | Admitting: *Deleted

## 2020-01-04 DIAGNOSIS — I2510 Atherosclerotic heart disease of native coronary artery without angina pectoris: Secondary | ICD-10-CM

## 2020-01-04 MED ORDER — ATORVASTATIN CALCIUM 40 MG PO TABS: 40.0000 mg | ORAL_TABLET | Freq: Every day | ORAL | 3 refills | Status: DC

## 2020-01-04 MED ORDER — ASPIRIN EC 81 MG PO TBEC: 81.0000 mg | DELAYED_RELEASE_TABLET | Freq: Every day | ORAL | 3 refills | Status: AC

## 2020-01-04 NOTE — Telephone Encounter (Signed)
-----   Message from [name], NP sent at 01/03/2020  9:33 PM EDT ----- Let's add asprin 81 mg daily and Lipitor 40 mg daily with next labs add lipids and hepatic  thanks.

## 2020-01-04 NOTE — Telephone Encounter (Signed)
Called pt re: message below.  Pt has been made aware to start Aspirin 81 and Atorvastatin 40 mg daily and we will draw fasting lipid / lft on his f/u appt 02/12/2020 and pt has been made aware to come fasting.  Pt verbalized understanding.

## 2020-01-21 NOTE — Progress Notes (Signed)
Patient ID: Mark Tate                 DOB: 1950/11/07                      MRN: [id number]     HPI: Mark Tate is a 69 y.o. male patient of Dr. Kyla Balzarine referred by Dr. Cecilie Kicks, NP to HTN clinic. PMH is significant for cardiomyopathy with EF 30-35%, LM calcification and aortic atherosclerosis on chest CT, COPD/dyspnea followed by pulmonary, and tobacco use.  Patient was referred to Dr. Johnsie Cancel and was seen 12/19/19 after CAD found on chest CT by pulmonologist, and patient was last seen in office on 12/19/19 where BP was 162/94, HR 75. ECHO on 01/01/20 also showed reduced EF and findings consistent with prior MI, so he was started on Entresto and carvedilol. Stress test on 12/21/19 showed findings consistent to a prior MI, so he was started on aspirin 81mg  daily and atorvastatin 40mg  daily when he was seen by Cecilie Kicks on 01/02/20 via telemedicine visit.  Patient arrives today in good spirits for initial visit. He has been tolerating his new medications well and denies any adverse effects. He feels that his SOB has improved slightly and denies any edema, orthopnea, or lightheadedness/dizziness. He does not measure his BP at home but is willing to get a BP cuff to monitor his BP daily. He also does not weigh himself daily but has a scale at home. 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 He tries to limit his salt intake but does eat foods high in saturated fats and drinks 2-3 cups of coffee and coke every day. He continues to smoke daily and is not ready to quit at this time.  Current HF/HTN meds: carvedilol 3.125 mg BID, Entresto 24/26 mg BID Previously tried: none BP goal: < 130/80 mmHg  Current lipid-lowering meds: atorvastatin 40mg  daily, omega-3 fatty acids 1g daily Intolerances: none LDL goal: <70 mg/dL  Family History: The patient's family history includes Cancer in his father and mother; Cancer -  Lung in his maternal aunt.  Social History: current smoker (1 PPD, 45 years)  Diet: Does not eat out frequently. For breakfast, ate sausage, eggs, and bacon. Ate a sandwich for lunch, and [employer] for dinner. Drinks 2-3 cups of coffee and ~1 can of Coke daily.   Exercise: remodeling house; not much activity with covid  Home BP readings: does not have a BP cuff  Labs: 01/02/20: Scr 1.18, K 4.6, BNP 218   Wt Readings from Last 3 Encounters:  01/02/20 188 lb (85.3 kg)  12/21/19 188 lb (85.3 kg)  12/19/19 188 lb (85.3 kg)   BP Readings from Last 3 Encounters:  12/19/19 (!) 162/94  04/14/18 140/88  02/24/18 (!) 142/90   Pulse Readings from Last 3 Encounters:  12/19/19 75  04/14/18 72  02/24/18 74    Renal function: CrCl cannot be calculated (Unknown ideal weight.).  Past Medical History:  Diagnosis Date  . Allergies   . Asthma   . CAD in native artery 01/03/2020  . COPD (chronic obstructive pulmonary disease) ([place])   . COPD (chronic obstructive pulmonary disease) ([place]) 01/03/2020  . Coughing   . Fatigue   . Neck swelling   . Skin cancer   . SOB (shortness of breath) on exertion   . Tobacco use 01/03/2020  . Wheezing  Current Outpatient Medications on File Prior to Visit  Medication Sig Dispense Refill  . acetaminophen (TYLENOL) 500 MG tablet Take 500 mg by mouth every 6 (six) hours as needed.    Marland Kitchen albuterol (VENTOLIN HFA) 108 (90 Base) MCG/ACT inhaler Inhale 1 puff into the lungs every 4 (four) hours as needed for wheezing or shortness of breath.    Marland Kitchen aspirin EC 81 MG tablet Take 1 tablet (81 mg total) by mouth daily. 90 tablet 3  . atorvastatin (LIPITOR) 40 MG tablet Take 1 tablet (40 mg total) by mouth daily. 90 tablet 3  . calcium carbonate (OS-CAL - DOSED IN MG OF ELEMENTAL CALCIUM) 1250 (500 Ca) MG tablet Take 1 tablet by mouth daily with breakfast.    . carvedilol (COREG) 3.125 MG tablet Take 1 tablet (3.125 mg total) by mouth 2 (two) times daily with a meal. 180 tablet  3  . cetirizine (ZYRTEC) 10 MG tablet Take 10 mg by mouth daily.    Marland Kitchen GLUCOSAMINE CHONDROITIN COMPLX PO Take by mouth daily.    . Multiple Vitamins-Minerals (CENTRUM SILVER 50+MEN) TABS Take 1 tablet by mouth daily.    . Omega-3 Fatty Acids (FISH OIL) 1000 MG CAPS Take 1 capsule by mouth daily.    . sacubitril-valsartan (ENTRESTO) 24-26 MG Take 1 tablet by mouth 2 (two) times daily. 60 tablet 11  . SYMBICORT 160-4.5 MCG/ACT inhaler     . VITAMIN D PO Take 1,000 Units by mouth daily.     No current facility-administered medications on file prior to visit.    No Known Allergies   Assessment/Plan:  1. Hypertension - BP remains elevated and above goal of < 130/80 mmHg. Will increase carvedilol to 6.25mg  BID. As long as labs today are stable, will also plan to increase Entresto to 49/51mg  BID. Patient should be started on spironolactone and possibly SGLT2 inhibitor at future visits for better BP control and CV/HF benefit. Patient will get a home BP cuff and start measuring his BP and weights daily. Instructed him to bring readings to next visit. Encouraged patient to stick to a heart-healthy diet low in sodium and caffeine and discussed the importance of maintaining a regular exercise regimen. Would ideally like to follow-up with him in 2 weeks, but will plan to recheck labs when he sees Cecilie Kicks on 02/12/20 as long as labs today are stable.  2. CAD - No baseline lipid panel on file. LDL goal is < 70 mg/dL. Continue atorvastatin 40mg  daily, omega-3 fatty acids 1g daily, and aspirin 81mg  daily. Follow-up fasting labs are scheduled for 02/12/20.  3. Smoking Cessation - Patient is not ready to quit at this time. Will re-discuss at future visits.  Richardine Service, PharmD PGY1 Pharmacy Resident

## 2020-01-22 ENCOUNTER — Other Ambulatory Visit: Payer: Self-pay

## 2020-01-22 ENCOUNTER — Ambulatory Visit (INDEPENDENT_AMBULATORY_CARE_PROVIDER_SITE_OTHER): Payer: Medicare Other | Admitting: Pharmacist

## 2020-01-22 ENCOUNTER — Other Ambulatory Visit: Payer: Medicare Other | Admitting: *Deleted

## 2020-01-22 VITALS — BP 158/90 | HR 74

## 2020-01-22 DIAGNOSIS — Z720 Tobacco use: Secondary | ICD-10-CM

## 2020-01-22 DIAGNOSIS — R931 Abnormal findings on diagnostic imaging of heart and coronary circulation: Secondary | ICD-10-CM

## 2020-01-22 DIAGNOSIS — Z79899 Other long term (current) drug therapy: Secondary | ICD-10-CM

## 2020-01-22 DIAGNOSIS — I420 Dilated cardiomyopathy: Secondary | ICD-10-CM

## 2020-01-22 MED ORDER — CARVEDILOL 6.25 MG PO TABS: 3.1250 mg | ORAL_TABLET | Freq: Two times a day (BID) | ORAL | 11 refills | Status: DC

## 2020-01-22 NOTE — Patient Instructions (Addendum)
It was nice to meet you today!   Your blood pressure goal is less than 130/21mmHg  INCREASE you carvedilol to 6.25mg  twice daily. You can double up on your 3.125mg  tablets (two tablets twice daily) until you run out of your supply.  If your labs are stable today, we will plan to increase your Entresto. We will give you a call when the labs come back so that we can finalize this plan and schedule follow up.  Continue taking atorvastatin 40mg  daily and aspirin 81mg  daily.  Start measuring your blood pressure and weight at home every day. Bring your readings and your home blood pressure cuff to your next appointment.

## 2020-01-23 ENCOUNTER — Telehealth: Payer: Self-pay | Admitting: Pharmacist

## 2020-01-23 LAB — BASIC METABOLIC PANEL
BUN/Creatinine Ratio: 11 (ref 10–24)
BUN: 13 mg/dL (ref 8–27)
CO2: 24 mmol/L (ref 20–29)
Calcium: 9.3 mg/dL (ref 8.6–10.2)
Chloride: 103 mmol/L (ref 96–106)
Creatinine, Ser: 1.15 mg/dL (ref 0.76–1.27)
GFR calc Af Amer: 74 mL/min/{1.73_m2} (ref >59)
GFR calc non Af Amer: 64 mL/min/{1.73_m2} (ref >59)
Glucose: 100 mg/dL — ABNORMAL HIGH (ref 65–99)
Potassium: 4.5 mmol/L (ref 3.5–5.2)
Sodium: 138 mmol/L (ref 134–144)

## 2020-01-23 LAB — PRO B NATRIURETIC PEPTIDE: NT-Pro BNP: 180 pg/mL (ref 0–376)

## 2020-01-23 MED ORDER — ENTRESTO 49-51 MG PO TABS: 1.0000 | ORAL_TABLET | Freq: Two times a day (BID) | ORAL | 11 refills | Status: DC

## 2020-01-23 NOTE — Telephone Encounter (Addendum)
BMP is stable. Will increase Entresto to 49/51mg  BID as planned. Called patient and made him aware labs were stable. In agreement to increase Entresto. Advised he can take 2 of the 24/26mg  tablets twice a day until he runs out and then start taking 1 of the 49/51mg  twice a day. Follow up labs 5/17  Pro BMP still pending

## 2020-02-11 NOTE — H&P (View-Only) (Signed)
Cardiology Office Note   Date:  02/12/2020   ID:  Mark Tate, DOB 1951-09-09, MRN [id number]  PCP:  Aurea Graff.Marlou Sa, MD  Cardiologist:  Dr. Johnsie Cancel     Chief Complaint  Patient presents with  . Congestive Heart Failure  . Cardiomyopathy      History of Present Illness: Mark Tate is a 69 y.o. male who presents for BP management and discuss Rt and Lt cardiac cath.   Hx of CAD on CT of chest. Smoker with 45 pack year history Seen by pulmonary Ramaswamy Rx with spiriva and symbicort Lung cancer screening CT done 05/23/19 no cancer Commented on LM coronary calcification and aortic atherosclerosis with focal dilatation of the descending thoracic aorta 4.3 cm moderate bullous emphysema This was also noted on my review of CT done 03/09/18 The calcification is very mild with no other calcium noted in the coronary arteries to my review  He has no exertional chest pain Has never had stress testing some exertional dyspnea from COPD Has had COVID vaccine Has two daughters from two marriages and 10 grand children. 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   Asked to stop tobacco.   CAD seen on CT for lung cancer screening.  Only in distal LM, stress test ordered. Echo also  myoview suggest old infarct  Echo with decreased, EF to 30-35%, G1 DD   Plan per Dr. Doreen Beam entrestro and coreg and increase in 3 weeks, follow up in 6 weeks to discuss Rt and Lt cardiac cath.   Aortic dilatation noted of ascending aorta at 39 mm  Discussed with pt and his sister was present for call. His results and plan to improve EF.  Discussed meds and eventual need for cath.   All questions answered. Pt has entresto and coreg at home.  nuc study with no ischemia but old scar - concerning for old MI, on CT distal LAD disease- will plan cath once titrated meds. Will add ASA 81 mg and statin  With decreased EF and adding entresto and coreg, last increased 01/22/20 entresto 49/51, and coreg 6.25 BID.     pt is back for rt and Lt  Cardiac cath discussion.  Needs labs.  Spironolactone post cath.    Today no chest pain and dyspnea has improved with medication.  He continues to smoke 1 ppd, discussed no more smoking in his house.  To put cigarettes  outside so he has to move to get to them, to help break the habit.  He will try this for now.  Discussed cardiac cath.   Past Medical History:  Diagnosis Date  . Allergies   . Asthma   . CAD in native artery 01/03/2020  . COPD (chronic obstructive pulmonary disease) ([place])   . COPD (chronic obstructive pulmonary disease) ([place]) 01/03/2020  . Coughing   . Fatigue   . Neck swelling   . Skin cancer   . SOB (shortness of breath) on exertion   . Tobacco use 01/03/2020  . Wheezing     Past Surgical History:  Procedure Laterality Date  . BASAL CELL CARCINOMA EXCISION     NECK  . CATARACT EXTRACTION    . MOHS SURGERY  2013, 2019   ON SCALP AND LEFT ARM      Current Outpatient Medications  Medication Sig Dispense Refill  . acetaminophen (TYLENOL) 500 MG tablet Take 500 mg by mouth every 6 (six) hours as needed.    Marland Kitchen albuterol (VENTOLIN HFA)  108 (90 Base) MCG/ACT inhaler Inhale 1 puff into the lungs every 4 (four) hours as needed for wheezing or shortness of breath.    Marland Kitchen aspirin EC 81 MG tablet Take 1 tablet (81 mg total) by mouth daily. 90 tablet 3  . atorvastatin (LIPITOR) 40 MG tablet Take 1 tablet (40 mg total) by mouth daily. 90 tablet 3  . calcium carbonate (OS-CAL - DOSED IN MG OF ELEMENTAL CALCIUM) 1250 (500 Ca) MG tablet Take 1 tablet by mouth daily with breakfast.    . carvedilol (COREG) 6.25 MG tablet Take 0.5 tablets (3.125 mg total) by mouth 2 (two) times daily with a meal. 30 tablet 11  . cetirizine (ZYRTEC) 10 MG tablet Take 10 mg by mouth daily.    Marland Kitchen GLUCOSAMINE CHONDROITIN COMPLX PO Take by mouth daily.    . Multiple Vitamins-Minerals (CENTRUM SILVER 50+MEN) TABS Take 1 tablet by mouth daily.    . Omega-3 Fatty Acids (FISH OIL) 1000  MG CAPS Take 1 capsule by mouth daily.    . SYMBICORT 160-4.5 MCG/ACT inhaler     . VITAMIN D PO Take 1,000 Units by mouth daily.     No current facility-administered medications for this visit.    Allergies:   Patient has no known allergies.    Social History:  The patient  reports that he has been smoking cigarettes. He has a 45.00 pack-year smoking history. He has never used smokeless tobacco.   Family History:  The patient's family history includes Cancer in his father and mother; Cancer - Lung in his maternal aunt.    ROS:  General:no colds or fevers, no weight changes Skin:no rashes or ulcers HEENT:no blurred vision, no congestion CV:see HPI PUL:see HPI GI:no diarrhea constipation or melena, no indigestion GU:no hematuria, no dysuria MS:no joint pain, no claudication Neuro:no syncope, no lightheadedness Endo:no diabetes, no thyroid disease  Wt Readings from Last 3 Encounters:  02/12/20 188 lb (85.3 kg)  01/02/20 188 lb (85.3 kg)  12/21/19 188 lb (85.3 kg)     PHYSICAL EXAM: VS:  BP 134/90   Pulse 74   Ht 5\' 6"  (1.676 m)   Wt 188 lb (85.3 kg)   SpO2 98%   BMI 30.34 kg/m  , BMI Body mass index is 30.34 kg/m. General:Pleasant affect, NAD Skin:Warm and dry, brisk capillary refill HEENT:normocephalic, sclera clear, mucus membranes moist Heart:S1S2 RRR without murmur, gallup, rub or click Lungs:clear without rales, rhonchi, or wheezes [id number], non tender, + BS, do not palpate liver spleen or masses Ext:no lower ext edema, 2+ pedal pulses, 2+ radial pulses Neuro:alert and oriented X 3, MAE, follows commands, + facial symmetry    EKG:  EKG is ordered today. The ekg ordered today demonstrates SR at 46 with non specific T wave depression but no changes from March 2021. stable   Recent Labs: 01/22/2020: BUN 13; Creatinine, Ser 1.15; NT-Pro BNP 180; Potassium 4.5; Sodium 138    Lipid Panel No results found for: CHOL, TRIG, HDL, CHOLHDL, VLDL, LDLCALC,  LDLDIRECT     Other studies Reviewed: Additional studies/ records that were reviewed today include:  Echo 01/01/20. IMPRESSIONS    1. Left ventricular ejection fraction, by estimation, is 30 to 35%. The  left ventricle has moderately decreased function. The left ventricle  demonstrates global hypokinesis. The left ventricular internal cavity size  was mildly dilated. Left ventricular  diastolic parameters are consistent with Grade I diastolic dysfunction  (impaired relaxation).  2. Right ventricular systolic function is normal. The  right ventricular  size is normal.  3. The mitral valve is normal in structure. No evidence of mitral valve  regurgitation. No evidence of mitral stenosis.  4. The aortic valve is normal in structure. Aortic valve regurgitation is  not visualized. No aortic stenosis is present.  5. Aortic dilatation noted. There is mild dilatation of the ascending  aorta measuring 39 mm.   FINDINGS  Left Ventricle: Left ventricular ejection fraction, by estimation, is 30  to 35%. The left ventricle has moderately decreased function. The left  ventricle demonstrates global hypokinesis. The left ventricular internal  cavity size was mildly dilated.  There is no left ventricular hypertrophy. Left ventricular diastolic  parameters are consistent with Grade I diastolic dysfunction (impaired  relaxation).   Right Ventricle: The right ventricular size is normal. No increase in  right ventricular wall thickness. Right ventricular systolic function is  normal.   Left Atrium: Left atrial size was normal in size.   Right Atrium: Right atrial size was normal in size.   Pericardium: There is no evidence of pericardial effusion.    nuc study 12/21/19 Study Highlights    There was no ST segment deviation noted during stress.  Hypertensive with resting BP 200/114  The left ventricular ejection fraction is mildly decreased (45-54%).  Nuclear stress EF:  46%.  Defect 1: There is a small defect of mild severity present in the apex location.  Defect 2: There is a small defect of mild severity present in the basal inferior, mid inferior and apical inferior location.  Findings consistent with prior myocardial infarction.  This is an intermediate risk study due to decreased systolic function. No ischemia.   Fixed perfusion defects at apex and inferior wall suggestive of prior infarct.  No ischemia.   Nuclear History and Indications    ASSESSMENT AND PLAN:  1.  Cardiomyopathy on echo WF 30-35% now on BB and entresto will check BMP today and then in 2-3 weeks repeat along with lipids and hepatic. And CBC for cardiac cath.  He prefers to wait for 3 weeks for cath.  Will do rt and lt cardiac cath Will increase entresto to max dose this visit.  He does note he feels better than he did prior to treatment. Continue coreg at current dose for now.  May need spironolactone post cath as well.    2.  CAD on CT of chest and abnormal stress test  old scar on nuc study concern that severe CAD and reason for decrease in EF. On asa and statin.    The patient understands that risks included but are not limited to stroke (1 in 1000), death (1 in 65), kidney failure [usually temporary] (1 in 500), bleeding (1 in 200), allergic reaction [possibly serious] (1 in 200).    3.  Tobacco use, discussed ways to decrease.    4.  COPD/dyspnea followed by pulmonary    Current medicines are reviewed with the patient today.  The patient Has no concerns regarding medicines.  The following changes have been made:  See above Labs/ tests ordered today include:see above  Disposition:   FU:  see above  Signed, Cecilie Kicks, NP  02/12/2020 10:14 AM    [address], [place], [place], [place] Phone: [phone]; Fax: [phone]

## 2020-02-11 NOTE — Progress Notes (Addendum)
Cardiology Office Note   Date:  02/12/2020   ID:  YASER PAAVOLA, DOB 1950/11/03, MRN [id number]  PCP:  Aurea Graff.Marlou Sa, MD  Cardiologist:  Dr. Johnsie Cancel     Chief Complaint  Patient presents with  . Congestive Heart Failure  . Cardiomyopathy      History of Present Illness: Mark Tate is a 69 y.o. male who presents for BP management and discuss Rt and Lt cardiac cath.   Hx of CAD on CT of chest. Smoker with 45 pack year history Seen by pulmonary Ramaswamy Rx with spiriva and symbicort Lung cancer screening CT done 05/23/19 no cancer Commented on LM coronary calcification and aortic atherosclerosis with focal dilatation of the descending thoracic aorta 4.3 cm moderate bullous emphysema This was also noted on my review of CT done 03/09/18 The calcification is very mild with no other calcium noted in the coronary arteries to my review  He has no exertional chest pain Has never had stress testing some exertional dyspnea from COPD Has had COVID vaccine Has two daughters from two marriages and 10 grand children. 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   Asked to stop tobacco.   CAD seen on CT for lung cancer screening.  Only in distal LM, stress test ordered. Echo also  myoview suggest old infarct  Echo with decreased, EF to 30-35%, G1 DD   Plan per Dr. Doreen Beam entrestro and coreg and increase in 3 weeks, follow up in 6 weeks to discuss Rt and Lt cardiac cath.   Aortic dilatation noted of ascending aorta at 39 mm  Discussed with pt and his sister was present for call. His results and plan to improve EF.  Discussed meds and eventual need for cath.   All questions answered. Pt has entresto and coreg at home.  nuc study with no ischemia but old scar - concerning for old MI, on CT distal LAD disease- will plan cath once titrated meds. Will add ASA 81 mg and statin  With decreased EF and adding entresto and coreg, last increased 01/22/20 entresto 49/51, and coreg 6.25 BID.     pt is back for rt and Lt  Cardiac cath discussion.  Needs labs.  Spironolactone post cath.    Today no chest pain and dyspnea has improved with medication.  He continues to smoke 1 ppd, discussed no more smoking in his house.  To put cigarettes  outside so he has to move to get to them, to help break the habit.  He will try this for now.  Discussed cardiac cath.   Past Medical History:  Diagnosis Date  . Allergies   . Asthma   . CAD in native artery 01/03/2020  . COPD (chronic obstructive pulmonary disease) ([place])   . COPD (chronic obstructive pulmonary disease) ([place]) 01/03/2020  . Coughing   . Fatigue   . Neck swelling   . Skin cancer   . SOB (shortness of breath) on exertion   . Tobacco use 01/03/2020  . Wheezing     Past Surgical History:  Procedure Laterality Date  . BASAL CELL CARCINOMA EXCISION     NECK  . CATARACT EXTRACTION    . MOHS SURGERY  2013, 2019   ON SCALP AND LEFT ARM      Current Outpatient Medications  Medication Sig Dispense Refill  . acetaminophen (TYLENOL) 500 MG tablet Take 500 mg by mouth every 6 (six) hours as needed.    Marland Kitchen albuterol (VENTOLIN HFA)  108 (90 Base) MCG/ACT inhaler Inhale 1 puff into the lungs every 4 (four) hours as needed for wheezing or shortness of breath.    Marland Kitchen aspirin EC 81 MG tablet Take 1 tablet (81 mg total) by mouth daily. 90 tablet 3  . atorvastatin (LIPITOR) 40 MG tablet Take 1 tablet (40 mg total) by mouth daily. 90 tablet 3  . calcium carbonate (OS-CAL - DOSED IN MG OF ELEMENTAL CALCIUM) 1250 (500 Ca) MG tablet Take 1 tablet by mouth daily with breakfast.    . carvedilol (COREG) 6.25 MG tablet Take 0.5 tablets (3.125 mg total) by mouth 2 (two) times daily with a meal. 30 tablet 11  . cetirizine (ZYRTEC) 10 MG tablet Take 10 mg by mouth daily.    Marland Kitchen GLUCOSAMINE CHONDROITIN COMPLX PO Take by mouth daily.    . Multiple Vitamins-Minerals (CENTRUM SILVER 50+MEN) TABS Take 1 tablet by mouth daily.    . Omega-3 Fatty Acids (FISH OIL) 1000  MG CAPS Take 1 capsule by mouth daily.    . SYMBICORT 160-4.5 MCG/ACT inhaler     . VITAMIN D PO Take 1,000 Units by mouth daily.     No current facility-administered medications for this visit.    Allergies:   Patient has no known allergies.    Social History:  The patient  reports that he has been smoking cigarettes. He has a 45.00 pack-year smoking history. He has never used smokeless tobacco.   Family History:  The patient's family history includes Cancer in his father and mother; Cancer - Lung in his maternal aunt.    ROS:  General:no colds or fevers, no weight changes Skin:no rashes or ulcers HEENT:no blurred vision, no congestion CV:see HPI PUL:see HPI GI:no diarrhea constipation or melena, no indigestion GU:no hematuria, no dysuria MS:no joint pain, no claudication Neuro:no syncope, no lightheadedness Endo:no diabetes, no thyroid disease  Wt Readings from Last 3 Encounters:  02/12/20 188 lb (85.3 kg)  01/02/20 188 lb (85.3 kg)  12/21/19 188 lb (85.3 kg)     PHYSICAL EXAM: VS:  BP 134/90   Pulse 74   Ht 5\' 6"  (1.676 m)   Wt 188 lb (85.3 kg)   SpO2 98%   BMI 30.34 kg/m  , BMI Body mass index is 30.34 kg/m. General:Pleasant affect, NAD Skin:Warm and dry, brisk capillary refill HEENT:normocephalic, sclera clear, mucus membranes moist Heart:S1S2 RRR without murmur, gallup, rub or click Lungs:clear without rales, rhonchi, or wheezes [id number], non tender, + BS, do not palpate liver spleen or masses Ext:no lower ext edema, 2+ pedal pulses, 2+ radial pulses Neuro:alert and oriented X 3, MAE, follows commands, + facial symmetry    EKG:  EKG is ordered today. The ekg ordered today demonstrates SR at 87 with non specific T wave depression but no changes from March 2021. stable   Recent Labs: 01/22/2020: BUN 13; Creatinine, Ser 1.15; NT-Pro BNP 180; Potassium 4.5; Sodium 138    Lipid Panel No results found for: CHOL, TRIG, HDL, CHOLHDL, VLDL, LDLCALC,  LDLDIRECT     Other studies Reviewed: Additional studies/ records that were reviewed today include:  Echo 01/01/20. IMPRESSIONS    1. Left ventricular ejection fraction, by estimation, is 30 to 35%. The  left ventricle has moderately decreased function. The left ventricle  demonstrates global hypokinesis. The left ventricular internal cavity size  was mildly dilated. Left ventricular  diastolic parameters are consistent with Grade I diastolic dysfunction  (impaired relaxation).  2. Right ventricular systolic function is normal. The  right ventricular  size is normal.  3. The mitral valve is normal in structure. No evidence of mitral valve  regurgitation. No evidence of mitral stenosis.  4. The aortic valve is normal in structure. Aortic valve regurgitation is  not visualized. No aortic stenosis is present.  5. Aortic dilatation noted. There is mild dilatation of the ascending  aorta measuring 39 mm.   FINDINGS  Left Ventricle: Left ventricular ejection fraction, by estimation, is 30  to 35%. The left ventricle has moderately decreased function. The left  ventricle demonstrates global hypokinesis. The left ventricular internal  cavity size was mildly dilated.  There is no left ventricular hypertrophy. Left ventricular diastolic  parameters are consistent with Grade I diastolic dysfunction (impaired  relaxation).   Right Ventricle: The right ventricular size is normal. No increase in  right ventricular wall thickness. Right ventricular systolic function is  normal.   Left Atrium: Left atrial size was normal in size.   Right Atrium: Right atrial size was normal in size.   Pericardium: There is no evidence of pericardial effusion.    nuc study 12/21/19 Study Highlights    There was no ST segment deviation noted during stress.  Hypertensive with resting BP 200/114  The left ventricular ejection fraction is mildly decreased (45-54%).  Nuclear stress EF:  46%.  Defect 1: There is a small defect of mild severity present in the apex location.  Defect 2: There is a small defect of mild severity present in the basal inferior, mid inferior and apical inferior location.  Findings consistent with prior myocardial infarction.  This is an intermediate risk study due to decreased systolic function. No ischemia.   Fixed perfusion defects at apex and inferior wall suggestive of prior infarct.  No ischemia.   Nuclear History and Indications    ASSESSMENT AND PLAN:  1.  Cardiomyopathy on echo WF 30-35% now on BB and entresto will check BMP today and then in 2-3 weeks repeat along with lipids and hepatic. And CBC for cardiac cath.  He prefers to wait for 3 weeks for cath.  Will do rt and lt cardiac cath Will increase entresto to max dose this visit.  He does note he feels better than he did prior to treatment. Continue coreg at current dose for now.  May need spironolactone post cath as well.    2.  CAD on CT of chest and abnormal stress test  old scar on nuc study concern that severe CAD and reason for decrease in EF. On asa and statin.    The patient understands that risks included but are not limited to stroke (1 in 1000), death (1 in 70), kidney failure [usually temporary] (1 in 500), bleeding (1 in 200), allergic reaction [possibly serious] (1 in 200).    3.  Tobacco use, discussed ways to decrease.    4.  COPD/dyspnea followed by pulmonary    Current medicines are reviewed with the patient today.  The patient Has no concerns regarding medicines.  The following changes have been made:  See above Labs/ tests ordered today include:see above  Disposition:   FU:  see above  Signed, Cecilie Kicks, NP  02/12/2020 10:14 AM    [facility], [place], [place], [place] Phone: [phone]; Fax: [phone]

## 2020-02-12 ENCOUNTER — Other Ambulatory Visit: Payer: Self-pay

## 2020-02-12 ENCOUNTER — Ambulatory Visit: Payer: Medicare Other | Admitting: Cardiology

## 2020-02-12 ENCOUNTER — Other Ambulatory Visit: Payer: Medicare Other

## 2020-02-12 ENCOUNTER — Encounter: Payer: Self-pay | Admitting: Cardiology

## 2020-02-12 VITALS — BP 134/90 | HR 74 | Ht 66.0 in | Wt 188.0 lb

## 2020-02-12 DIAGNOSIS — I25118 Atherosclerotic heart disease of native coronary artery with other forms of angina pectoris: Secondary | ICD-10-CM

## 2020-02-12 DIAGNOSIS — R9439 Abnormal result of other cardiovascular function study: Secondary | ICD-10-CM | POA: Diagnosis not present

## 2020-02-12 DIAGNOSIS — I429 Cardiomyopathy, unspecified: Secondary | ICD-10-CM | POA: Diagnosis not present

## 2020-02-12 DIAGNOSIS — R931 Abnormal findings on diagnostic imaging of heart and coronary circulation: Secondary | ICD-10-CM

## 2020-02-12 DIAGNOSIS — Z720 Tobacco use: Secondary | ICD-10-CM | POA: Diagnosis not present

## 2020-02-12 DIAGNOSIS — I2510 Atherosclerotic heart disease of native coronary artery without angina pectoris: Secondary | ICD-10-CM

## 2020-02-12 DIAGNOSIS — J449 Chronic obstructive pulmonary disease, unspecified: Secondary | ICD-10-CM

## 2020-02-12 LAB — BASIC METABOLIC PANEL
BUN/Creatinine Ratio: 12 (ref 10–24)
BUN: 13 mg/dL (ref 8–27)
CO2: 23 mmol/L (ref 20–29)
Calcium: 9.3 mg/dL (ref 8.6–10.2)
Chloride: 98 mmol/L (ref 96–106)
Creatinine, Ser: 1.12 mg/dL (ref 0.76–1.27)
GFR calc Af Amer: 76 mL/min/{1.73_m2} (ref >59)
GFR calc non Af Amer: 66 mL/min/{1.73_m2} (ref >59)
Glucose: 101 mg/dL — ABNORMAL HIGH (ref 65–99)
Potassium: 4.3 mmol/L (ref 3.5–5.2)
Sodium: 138 mmol/L (ref 134–144)

## 2020-02-12 LAB — HEPATIC FUNCTION PANEL
ALT: 18 IU/L (ref 0–44)
AST: 16 IU/L (ref 0–40)
Albumin: 4.1 g/dL (ref 3.7–4.7)
Alkaline Phosphatase: 79 IU/L (ref 48–121)
Bilirubin Total: 0.6 mg/dL (ref 0.0–1.2)
Bilirubin, Direct: 0.18 mg/dL (ref 0.00–0.40)
Total Protein: 6.3 g/dL (ref 6.0–8.5)

## 2020-02-12 LAB — LIPID PANEL
Chol/HDL Ratio: 3.5 ratio (ref 0.0–5.0)
Cholesterol, Total: 128 mg/dL (ref 100–199)
HDL: 37 mg/dL — ABNORMAL LOW (ref >39)
LDL Chol Calc (NIH): 52 mg/dL (ref 0–99)
Triglycerides: 246 mg/dL — ABNORMAL HIGH (ref 0–149)
VLDL Cholesterol Cal: 39 mg/dL (ref 5–40)

## 2020-02-12 MED ORDER — SACUBITRIL-VALSARTAN 97-103 MG PO TABS: 1.0000 | ORAL_TABLET | Freq: Two times a day (BID) | ORAL | 11 refills | Status: DC

## 2020-02-12 MED ORDER — CARVEDILOL 6.25 MG PO TABS: 6.2500 mg | ORAL_TABLET | Freq: Two times a day (BID) | ORAL | 3 refills | Status: DC

## 2020-02-12 NOTE — Patient Instructions (Addendum)
Medication Instructions:  Your physician has recommended you make the following change in your medication:  1-Increase Entresto 97/103 mg by mouth twice daily.  *If you need a refill on your cardiac medications before your next appointment, please call your pharmacy*  Lab Work: If you have labs (blood work) drawn today and your tests are completely normal, you will receive your results only by: Marland Kitchen MyChart Message (if you have MyChart) OR . A paper copy in the mail If you have any lab test that is abnormal or we need to change your treatment, we will call you to review the results.  Testing/Procedures: Your physician has requested that you have a cardiac catheterization. Cardiac catheterization is used to diagnose and/or treat various heart conditions. Doctors may recommend this procedure for a number of different reasons. The most common reason is to evaluate chest pain. Chest pain can be a symptom of coronary artery disease (CAD), and cardiac catheterization can show whether plaque is narrowing or blocking your heart's arteries. This procedure is also used to evaluate the valves, as well as measure the blood flow and oxygen levels in different parts of your heart. For further information please visit [email or web address]. Please follow instruction sheet, as given.  Follow-Up: At [facility], you and your health needs are our priority.  As part of our continuing mission to provide you with exceptional heart care, we have created designated Provider Care Teams.  These Care Teams include your primary Cardiologist (physician) and Advanced Practice Providers (APPs -  Physician Assistants and Nurse Practitioners) who all work together to provide you with the care you need, when you need it.  We recommend signing up for the patient portal called "MyChart".  Sign up information is provided on this After Visit Summary.  MyChart is used to connect with patients for Virtual Visits (Telemedicine).  Patients  are able to view lab/test results, encounter notes, upcoming appointments, etc.  Non-urgent messages can be sent to your provider as well.   To learn more about what you can do with MyChart, go to [email or web address].    Your next appointment:   6 weeks  The format for your next appointment:   In Person  Provider:   You may see Jenkins Rouge, MD or one of the following Advanced Practice Providers on your designated Care Team:    Truitt Merle, NP  Cecilie Kicks, NP  Kathyrn Drown, NP       [facility] OFFICE [place], [place] [address] Dept: [place]: [place]  02/12/2020  You are scheduled for a Cardiac Catheterization on Friday, June 11 with Dr. [name].  1. Please arrive at the [facility] (Main Entrance A) at [facility]: [address] [place], [place] [address] at 8:30 AM (This time is two hours before your procedure to ensure your preparation). Free valet parking service is available.   Special note: Every effort is made to have your procedure done on time. Please understand that emergencies sometimes delay scheduled procedures.  2. Diet: Do not eat solid foods after midnight.  The patient may have clear liquids until 5am upon the day of the procedure.  3. Labs: You will need to have blood drawn on Tuesday, June 1 at [facility] at [facility]. [address]  Open: 7:30am - 5pm    Phone: [phone]. You do not need to be  fasting.  Your Pre-procedure COVID-19 Testing will be done on 03/05/20 at [facility] at [id number], [place], [place] [address]. Once you arrive at the testing site, stay in the right hand lane, go under the building overhang not the tent. If you are tested under the tent your results may not be back before your procedure. Please be on time for  your appointment.  After your swab you will be given a mask to wear and instructed to go home and quarantine/no visitors until after your procedure. If you test positive you will be notified and your procedure will be cancelled.   4. Medication instructions in preparation for your procedure:  On the morning of your procedure, take your Aspirin and any morning medicines NOT listed above.  You may use sips of water.  5. Plan for one night stay--bring personal belongings. 6. Bring a current list of your medications and current insurance cards. 7. You MUST have a responsible person to drive you home. 8. Someone MUST be with you the first 24 hours after you arrive home or your discharge will be delayed. 9. Please wear clothes that are easy to get on and off and wear slip-on shoes.  Thank you for allowing [place] to care for you!   -- [facility]

## 2020-02-27 ENCOUNTER — Other Ambulatory Visit: Payer: Self-pay

## 2020-02-27 ENCOUNTER — Other Ambulatory Visit: Payer: Medicare Other | Admitting: *Deleted

## 2020-02-27 DIAGNOSIS — I429 Cardiomyopathy, unspecified: Secondary | ICD-10-CM

## 2020-02-27 DIAGNOSIS — R9439 Abnormal result of other cardiovascular function study: Secondary | ICD-10-CM

## 2020-02-27 DIAGNOSIS — I25118 Atherosclerotic heart disease of native coronary artery with other forms of angina pectoris: Secondary | ICD-10-CM

## 2020-02-27 LAB — CBC WITH DIFFERENTIAL/PLATELET
Basophils Absolute: 0 10*3/uL (ref 0.0–0.2)
Basos: 0 %
EOS (ABSOLUTE): 0.5 10*3/uL — ABNORMAL HIGH (ref 0.0–0.4)
Eos: 5 %
Hematocrit: 46.1 % (ref 37.5–51.0)
Hemoglobin: 16 g/dL (ref 13.0–17.7)
Lymphocytes Absolute: 4.4 10*3/uL — ABNORMAL HIGH (ref 0.7–3.1)
Lymphs: 48 %
MCH: 31.2 pg (ref 26.6–33.0)
MCHC: 34.7 g/dL (ref 31.5–35.7)
MCV: 90 fL (ref 79–97)
Monocytes Absolute: 0.8 10*3/uL (ref 0.1–0.9)
Monocytes: 9 %
Neutrophils Absolute: 3.5 10*3/uL (ref 1.4–7.0)
Neutrophils: 38 %
Platelets: 205 10*3/uL (ref 150–450)
RBC: 5.13 x10E6/uL (ref 4.14–5.80)
RDW: 14.6 % (ref 11.6–15.4)
WBC: 9.2 10*3/uL (ref 3.4–10.8)

## 2020-02-27 LAB — BASIC METABOLIC PANEL
BUN/Creatinine Ratio: 13 (ref 10–24)
BUN: 14 mg/dL (ref 8–27)
CO2: 31 mmol/L — ABNORMAL HIGH (ref 20–29)
Calcium: 9.4 mg/dL (ref 8.6–10.2)
Chloride: 104 mmol/L (ref 96–106)
Creatinine, Ser: 1.08 mg/dL (ref 0.76–1.27)
GFR calc Af Amer: 79 mL/min/{1.73_m2} (ref >59)
GFR calc non Af Amer: 69 mL/min/{1.73_m2} (ref >59)
Glucose: 103 mg/dL — ABNORMAL HIGH (ref 65–99)
Potassium: 4.2 mmol/L (ref 3.5–5.2)
Sodium: 139 mmol/L (ref 134–144)

## 2020-03-05 ENCOUNTER — Other Ambulatory Visit (HOSPITAL_COMMUNITY)
Admission: RE | Admit: 2020-03-05 | Discharge: 2020-03-05 | Disposition: A | Discharge status: Home / Self Care | Payer: Medicare Other | Source: Ambulatory Visit | Attending: Cardiovascular Disease | Admitting: Cardiovascular Disease

## 2020-03-05 DIAGNOSIS — Z20822 Contact with and (suspected) exposure to covid-19: Secondary | ICD-10-CM | POA: Diagnosis not present

## 2020-03-05 DIAGNOSIS — Z01812 Encounter for preprocedural laboratory examination: Secondary | ICD-10-CM | POA: Diagnosis present

## 2020-03-05 LAB — SARS CORONAVIRUS 2 (TAT 6-24 HRS): SARS Coronavirus 2: NEGATIVE

## 2020-03-07 ENCOUNTER — Telehealth: Payer: Self-pay | Admitting: *Deleted

## 2020-03-07 NOTE — Telephone Encounter (Signed)
Pt contacted pre-catheterization scheduled at [facility] for: Friday March 08, 2020 10:30 AM Verified arrival time and place: [facility]) at: 8:30 AM    No solid food after midnight prior to cath, clear liquids until 5 AM day of procedure.   AM meds can be  taken pre-cath with sips of water including: ASA 81 mg   Confirmed patient has responsible adult to drive home post procedure and observe 24 hours after arriving home: yes  You are allowed ONE visitor in the waiting room during your procedure. Both you and your visitor must wear masks.      COVID-19 Pre-Screening Questions:   In the past 7 to 10 days have you had a cough,  shortness of breath, headache, congestion, fever (100 or greater) body aches, chills, sore throat, or sudden loss of taste or sense of smell? no  Have you been around anyone with known Covid 19 in the past 7 to 10 days? no    Reviewed procedure/mask/visitor instructions, COVID-19 screening questions with patient.

## 2020-03-08 ENCOUNTER — Ambulatory Visit (HOSPITAL_COMMUNITY)
Admission: RE | Admit: 2020-03-08 | Discharge: 2020-03-08 | Disposition: A | Discharge status: Home / Self Care | Payer: Medicare Other | Attending: Cardiovascular Disease | Admitting: Cardiovascular Disease

## 2020-03-08 ENCOUNTER — Other Ambulatory Visit: Payer: Self-pay

## 2020-03-08 ENCOUNTER — Ambulatory Visit (HOSPITAL_COMMUNITY)
Admission: RE | Disposition: A | Discharge status: Home / Self Care | Payer: Medicare Other | Source: Home / Self Care | Attending: Cardiovascular Disease

## 2020-03-08 DIAGNOSIS — I509 Heart failure, unspecified: Secondary | ICD-10-CM | POA: Insufficient documentation

## 2020-03-08 DIAGNOSIS — Z7951 Long term (current) use of inhaled steroids: Secondary | ICD-10-CM | POA: Insufficient documentation

## 2020-03-08 DIAGNOSIS — R9439 Abnormal result of other cardiovascular function study: Secondary | ICD-10-CM

## 2020-03-08 DIAGNOSIS — I2510 Atherosclerotic heart disease of native coronary artery without angina pectoris: Secondary | ICD-10-CM | POA: Insufficient documentation

## 2020-03-08 DIAGNOSIS — I429 Cardiomyopathy, unspecified: Secondary | ICD-10-CM | POA: Diagnosis not present

## 2020-03-08 DIAGNOSIS — Z7982 Long term (current) use of aspirin: Secondary | ICD-10-CM | POA: Diagnosis not present

## 2020-03-08 DIAGNOSIS — F17210 Nicotine dependence, cigarettes, uncomplicated: Secondary | ICD-10-CM | POA: Diagnosis not present

## 2020-03-08 DIAGNOSIS — J449 Chronic obstructive pulmonary disease, unspecified: Secondary | ICD-10-CM | POA: Insufficient documentation

## 2020-03-08 DIAGNOSIS — Z79899 Other long term (current) drug therapy: Secondary | ICD-10-CM | POA: Diagnosis not present

## 2020-03-08 LAB — POCT I-STAT EG7
Acid-Base Excess: 0 mmol/L (ref 0.0–2.0)
Acid-base deficit: 1 mmol/L (ref 0.0–2.0)
Bicarbonate: 24.6 mmol/L (ref 20.0–28.0)
Bicarbonate: 26.5 mmol/L (ref 20.0–28.0)
Calcium, Ion: 1.06 mmol/L — ABNORMAL LOW (ref 1.15–1.40)
Calcium, Ion: 1.11 mmol/L — ABNORMAL LOW (ref 1.15–1.40)
HCT: 41 % (ref 39.0–52.0)
HCT: 42 % (ref 39.0–52.0)
Hemoglobin: 13.9 g/dL (ref 13.0–17.0)
Hemoglobin: 14.3 g/dL (ref 13.0–17.0)
O2 Saturation: 76 %
O2 Saturation: 99 %
Potassium: 3.6 mmol/L (ref 3.5–5.1)
Potassium: 3.7 mmol/L (ref 3.5–5.1)
Sodium: 146 mmol/L — ABNORMAL HIGH (ref 135–145)
Sodium: 146 mmol/L — ABNORMAL HIGH (ref 135–145)
TCO2: 26 mmol/L (ref 22–32)
TCO2: 28 mmol/L (ref 22–32)
pCO2, Ven: 44.9 mmHg (ref 44.0–60.0)
pCO2, Ven: 49.5 mmHg (ref 44.0–60.0)
pH, Ven: 7.336 (ref 7.250–7.430)
pH, Ven: 7.347 (ref 7.250–7.430)
pO2, Ven: 146 mmHg — ABNORMAL HIGH (ref 32.0–45.0)
pO2, Ven: 44 mmHg (ref 32.0–45.0)

## 2020-03-08 SURGERY — RIGHT/LEFT HEART CATH AND CORONARY ANGIOGRAPHY
Anesthesia: LOCAL

## 2020-03-08 MED ORDER — SODIUM CHLORIDE 0.9 % IV SOLN: 250.0000 mL | INTRAVENOUS | Status: DC | PRN

## 2020-03-08 MED ORDER — LIDOCAINE HCL (PF) 1 % IJ SOLN
INTRAMUSCULAR | Status: DC | PRN
  Administered 2020-03-08: 2 mL via INTRADERMAL

## 2020-03-08 MED ORDER — FENTANYL CITRATE (PF) 100 MCG/2ML IJ SOLN
INTRAMUSCULAR | Status: AC
  Filled 2020-03-08: qty 2

## 2020-03-08 MED ORDER — HEPARIN SODIUM (PORCINE) 1000 UNIT/ML IJ SOLN
INTRAMUSCULAR | Status: DC | PRN
  Administered 2020-03-08: 4000 [IU] via INTRAVENOUS

## 2020-03-08 MED ORDER — SODIUM CHLORIDE 0.9% FLUSH: 3.0000 mL | INTRAVENOUS | Status: DC | PRN

## 2020-03-08 MED ORDER — SODIUM CHLORIDE 0.9 % IV SOLN: INTRAVENOUS | Status: AC

## 2020-03-08 MED ORDER — SODIUM CHLORIDE 0.9% FLUSH: 3.0000 mL | Freq: Two times a day (BID) | INTRAVENOUS | Status: DC

## 2020-03-08 MED ORDER — HEPARIN (PORCINE) IN NACL 1000-0.9 UT/500ML-% IV SOLN
INTRAVENOUS | Status: AC
  Filled 2020-03-08: qty 1000

## 2020-03-08 MED ORDER — MIDAZOLAM HCL 2 MG/2ML IJ SOLN
INTRAMUSCULAR | Status: DC | PRN
  Administered 2020-03-08: 2 mg via INTRAVENOUS

## 2020-03-08 MED ORDER — LIDOCAINE HCL (PF) 1 % IJ SOLN
INTRAMUSCULAR | Status: AC
  Filled 2020-03-08: qty 30

## 2020-03-08 MED ORDER — MIDAZOLAM HCL 2 MG/2ML IJ SOLN
INTRAMUSCULAR | Status: AC
  Filled 2020-03-08: qty 2

## 2020-03-08 MED ORDER — VERAPAMIL HCL 2.5 MG/ML IV SOLN
INTRAVENOUS | Status: AC
  Filled 2020-03-08: qty 2

## 2020-03-08 MED ORDER — HYDRALAZINE HCL 20 MG/ML IJ SOLN: 10.0000 mg | INTRAMUSCULAR | Status: DC | PRN

## 2020-03-08 MED ORDER — HEPARIN SODIUM (PORCINE) 1000 UNIT/ML IJ SOLN
INTRAMUSCULAR | Status: AC
  Filled 2020-03-08: qty 1

## 2020-03-08 MED ORDER — IOHEXOL 350 MG/ML SOLN
INTRAVENOUS | Status: DC | PRN
  Administered 2020-03-08: 40 mL

## 2020-03-08 MED ORDER — ASPIRIN 81 MG PO CHEW: 81.0000 mg | CHEWABLE_TABLET | ORAL | Status: DC

## 2020-03-08 MED ORDER — ONDANSETRON HCL 4 MG/2ML IJ SOLN: 4.0000 mg | Freq: Four times a day (QID) | INTRAMUSCULAR | Status: DC | PRN

## 2020-03-08 MED ORDER — LABETALOL HCL 5 MG/ML IV SOLN: 10.0000 mg | INTRAVENOUS | Status: DC | PRN

## 2020-03-08 MED ORDER — FENTANYL CITRATE (PF) 100 MCG/2ML IJ SOLN
INTRAMUSCULAR | Status: DC | PRN
  Administered 2020-03-08: 50 ug via INTRAVENOUS

## 2020-03-08 MED ORDER — SODIUM CHLORIDE 0.9 % WEIGHT BASED INFUSION
3.0000 mL/kg/h | INTRAVENOUS | Status: AC
  Administered 2020-03-08: 3 mL/kg/h via INTRAVENOUS

## 2020-03-08 MED ORDER — ACETAMINOPHEN 325 MG PO TABS: 650.0000 mg | ORAL_TABLET | ORAL | Status: DC | PRN

## 2020-03-08 MED ORDER — SODIUM CHLORIDE 0.9 % WEIGHT BASED INFUSION
1.0000 mL/kg/h | INTRAVENOUS | Status: DC
  Administered 2020-03-08: 1 mL/kg/h via INTRAVENOUS

## 2020-03-08 SURGICAL SUPPLY — 11 items

## 2020-03-08 NOTE — Progress Notes (Signed)
Patient and wife were given discharge instructions. Both verbalized understanding. 

## 2020-03-08 NOTE — Discharge Instructions (Signed)
Drink plenty of fluid for 48 hours and keep wrist elevated at heart level for 24 hours  Radial Site Care   This sheet gives you information about how to care for yourself after your procedure. Your health care provider may also give you more specific instructions. If you have problems or questions, contact your health care provider. What can I expect after the procedure? After the procedure, it is common to have:  Bruising and tenderness at the catheter insertion area. Follow these instructions at home: Medicines  Take over-the-counter and prescription medicines only as told by your health care provider. Insertion site care 1. Follow instructions from your health care provider about how to take care of your insertion site. Make sure you: ? Wash your hands with soap and water before you change your bandage (dressing). If soap and water are not available, use hand sanitizer. ? remove your dressing as told by your health care provider. In 24 hours 2. Check your insertion site every day for signs of infection. Check for: ? Redness, swelling, or pain. ? Fluid or blood. ? Pus or a bad smell. ? Warmth. 3. Do not take baths, swim, or use a hot tub until your health care provider approves. 4. You may shower 24-48 hours after the procedure, or as directed by your health care provider. ? Remove the dressing and gently wash the site with plain soap and water. ? Pat the area dry with a clean towel. ? Do not rub the site. That could cause bleeding. 5. Do not apply powder or lotion to the site. Activity   1. For 24 hours after the procedure, or as directed by your health care provider: ? Do not flex or bend the affected arm. ? Do not push or pull heavy objects with the affected arm. ? Do not drive yourself home from the hospital or clinic. You may drive 24 hours after the procedure unless your health care provider tells you not to. ? Do not operate machinery or power tools. 2. Do not lift  anything that is heavier than 10 lb (4.5 kg), or the limit that you are told, until your health care provider says that it is safe. For 4 days 3. Ask your health care provider when it is okay to: ? Return to work or school. ? Resume usual physical activities or sports. ? Resume sexual activity. General instructions  If the catheter site starts to bleed, raise your arm and put firm pressure on the site. If the bleeding does not stop, get help right away. This is a medical emergency.  If you went home on the same day as your procedure, a responsible adult should be with you for the first 24 hours after you arrive home.  Keep all follow-up visits as told by your health care provider. This is important. Contact a health care provider if:  You have a fever.  You have redness, swelling, or yellow drainage around your insertion site. Get help right away if:  You have unusual pain at the radial site.  The catheter insertion area swells very fast.  The insertion area is bleeding, and the bleeding does not stop when you hold steady pressure on the area.  Your arm or hand becomes pale, cool, tingly, or numb. These symptoms may represent a serious problem that is an emergency. Do not wait to see if the symptoms will go away. Get medical help right away. Call your local emergency services (911 in the [place] Do not   drive yourself to the hospital. Summary  After the procedure, it is common to have bruising and tenderness at the site.  Follow instructions from your health care provider about how to take care of your radial site wound. Check the wound every day for signs of infection.  Do not lift anything that is heavier than 10 lb (4.5 kg), or the limit that you are told, until your health care provider says that it is safe. This information is not intended to replace advice given to you by your health care provider. Make sure you discuss any questions you have with your health care  provider. Document Revised: 10/20/2017 Document Reviewed: 10/20/2017 Elsevier Patient Education  2020 [employer]vier Inc.  

## 2020-03-08 NOTE — Interval H&P Note (Signed)
History and Physical Interval Note:  03/08/2020 9:49 AM  Mark Tate  has presented today for surgery, with the diagnosis of cad.  The various methods of treatment have been discussed with the patient and family. After consideration of risks, benefits and other options for treatment, the patient has consented to  Procedure(s): RIGHT/LEFT HEART CATH AND CORONARY ANGIOGRAPHY (N/A) as a surgical intervention.  The patient's history has been reviewed, patient examined, no change in status, stable for surgery.  I have reviewed the patient's chart and labs.  Questions were answered to the patient's satisfaction.    Cath Lab Visit (complete for each Cath Lab visit)  Clinical Evaluation Leading to the Procedure:   ACS: No.  Non-ACS:    Anginal Classification: No Symptoms  Anti-ischemic medical therapy: Maximal Therapy (2 or more classes of medications)  Non-Invasive Test Results: Intermediate-risk stress test findings: cardiac mortality 1-3%/year  Prior CABG: No previous CABG        Mark Tate

## 2020-03-11 ENCOUNTER — Encounter (HOSPITAL_COMMUNITY): Payer: Self-pay | Admitting: Cardiovascular Disease

## 2020-03-11 MED FILL — Verapamil HCl IV Soln 2.5 MG/ML: INTRAVENOUS | Qty: 2 | Status: AC

## 2020-03-11 MED FILL — Heparin Sod (Porcine)-NaCl IV Soln 1000 Unit/500ML-0.9%: INTRAVENOUS | Qty: 1000 | Status: AC

## 2020-03-26 NOTE — Progress Notes (Signed)
Cardiology Office Note   Date:  03/28/2020   ID:  Mark Tate, DOB 1951-03-21, MRN [id number]  PCP:  Aurea Graff.Marlou Sa, MD  Cardiologist:  Dr. Johnsie Cancel     Chief Complaint  Patient presents with  . Hospitalization Follow-up      History of Present Illness: Mark Tate is a 69 y.o. male who presents for post hospitalization with cath.   Hx of CADon CT of chest. Smoker with 45 pack year history Seen by pulmonary Ramaswamy Rx with spiriva and symbicort Lung cancer screening CT done 05/23/19 no cancer Commented on LM coronary calcification and aortic atherosclerosis with focal dilatation of the descending thoracic aorta 4.3 cm moderate bullous emphysema This was also noted on my review of CT done 03/09/18 The calcification is very mild with no other calcium noted in the coronary arteries to my review  He has no exertional chest pain Has never had stress testing some exertional dyspnea from COPD Has had COVID vaccine Has two daughters from two marriages and 10 grand children. 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   Asked to stop tobacco.  CAD seen on CT for lung cancer screening. Only in distal LM, stress test ordered. Echo also  myoview suggest old infarct  Echo with decreased, EF to 30-35%, G1 DD Plan per Dr. Doreen Beam entrestro and coreg and increase in 3 weeks, follow up in 6 weeks to discuss Rt and Lt cardiac cath. Aortic dilatation noted of ascending aorta at 39 mm  Discussed with pt and his sister was present for call.His results and plan to improve EF. Discussed meds and eventual need for cath. All questions answered. Pt has entresto and coreg at home. nuc study with no ischemia but old scar - concerning for old MI, on CT distal LAD disease- will plan cath once titrated meds. Will add ASA 81 mg and statin  With decreased EF and adding entresto and coreg, last increased 01/22/20 entresto 49/51, and coreg 6.25 BID.    pt is back for rt and Lt  Cardiac  cath discussion.  Needs labs.  Spironolactone post cath.    Today no chest pain and dyspnea has improved with medication.  He continues to smoke 1 ppd, discussed no more smoking in his house.  To put cigarettes  outside so he has to move to get to them, to help break the habit.  He will try this for now.  Discussed cardiac cath.   Cath with pRCA 30 %, mid RCA to dist RCA 20% stenosed, porx LAD to mLAD 20% stenosis, normal Rt and Left heart pressure. Done 03/08/20 Continue meds and repeat echo.   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  But no SOB or swelling at all.  No racing HR, no palpitaions.  He does continue to smoke but has cut back.  Past Medical History:  Diagnosis Date  . Allergies   . Asthma   . CAD in native artery 01/03/2020  . COPD (chronic obstructive pulmonary disease) ([place])   . COPD (chronic obstructive pulmonary disease) ([place]) 01/03/2020  . Coughing   . Fatigue   . Neck swelling   . Skin cancer   . SOB (shortness of breath) on exertion   . Tobacco use 01/03/2020  . Wheezing     Past Surgical History:  Procedure Laterality Date  . BASAL  CELL CARCINOMA EXCISION     NECK  . CATARACT EXTRACTION    . MOHS SURGERY  2013, 2019   ON SCALP AND LEFT ARM   . RIGHT/LEFT HEART CATH AND CORONARY ANGIOGRAPHY N/A 03/08/2020   Procedure: RIGHT/LEFT HEART CATH AND CORONARY ANGIOGRAPHY;  Surgeon: Burnell Blanks, MD;  Location: [facility];  Service: Cardiovascular;  Laterality: N/A;     Current Outpatient Medications  Medication Sig Dispense Refill  . acetaminophen (TYLENOL) 500 MG tablet Take 500 mg by mouth every 6 (six) hours as needed for headache.     . albuterol (VENTOLIN HFA) 108 (90 Base) MCG/ACT inhaler Inhale 1-2 puffs into the lungs every 6 (six) hours as needed for wheezing or shortness of breath.     . Ascorbic Acid (VITAMIN C) 1000 MG tablet Take 1,000  mg by mouth daily.    Marland Kitchen aspirin EC 81 MG tablet Take 1 tablet (81 mg total) by mouth daily. 90 tablet 3  . atorvastatin (LIPITOR) 40 MG tablet Take 1 tablet (40 mg total) by mouth daily. 90 tablet 3  . carvedilol (COREG) 6.25 MG tablet Take 1 tablet (6.25 mg total) by mouth 2 (two) times daily with a meal. 180 tablet 3  . cetirizine (ZYRTEC) 10 MG tablet Take 10 mg by mouth daily.    . cholecalciferol (VITAMIN D3) 25 MCG (1000 UNIT) tablet Take 1,000 Units by mouth daily.    . Multiple Vitamins-Minerals (CENTRUM SILVER 50+MEN) TABS Take 1 tablet by mouth daily.    . Omega-3 Fatty Acids (FISH OIL) 1000 MG CAPS Take 1,000 mg by mouth daily.     . sacubitril-valsartan (ENTRESTO) 97-103 MG Take 1 tablet by mouth 2 (two) times daily. 60 tablet 11  . SYMBICORT 160-4.5 MCG/ACT inhaler Inhale 2 puffs into the lungs in the morning and at bedtime.      No current facility-administered medications for this visit.    Allergies:   Patient has no known allergies.    Social History:  The patient  reports that he has been smoking cigarettes. He has a 45.00 pack-year smoking history. He has never used smokeless tobacco.   Family History:  The patient's family history includes Cancer in his father and mother; Cancer - Lung in his maternal aunt.    ROS:  General:no colds or fevers, no weight changes Skin:no rashes or ulcers HEENT:no blurred vision, no congestion CV:see HPI PUL:see HPI GI:no diarrhea constipation or melena, no indigestion GU:no hematuria, no dysuria MS:no joint pain, no claudication Neuro:no syncope, no lightheadedness Endo:no diabetes, no thyroid disease  Wt Readings from Last 3 Encounters:  03/28/20 185 lb 6.4 oz (84.1 kg)  03/08/20 180 lb (81.6 kg)  02/12/20 188 lb (85.3 kg)     PHYSICAL EXAM: VS:  BP 114/70   Pulse 72   Ht 5\' 11"  (1.803 m)   Wt 185 lb 6.4 oz (84.1 kg)   BMI 25.86 kg/m  , BMI Body mass index is 25.86 kg/m. General:Pleasant affect, NAD Skin:Warm and  dry, brisk capillary refill HEENT:normocephalic, sclera clear, mucus membranes moist Neck:supple, no JVD, no bruits  Heart:S1S2 RRR without murmur, gallup, rub or click Lungs:clear without rales, rhonchi, or wheezes BTD:VVOH, non tender, + BS, do not palpate liver spleen or masses Ext:no lower ext edema, 2+ pedal pulses, 2+ radial pulses Neuro:alert and oriented X 3, MAE, follows commands, + facial symmetry    EKG:  EKG is NOT ordered today.    Recent Labs: 01/22/2020: NT-Pro BNP  180 02/12/2020: ALT 18 02/27/2020: BUN 14; Creatinine, Ser 1.08; Platelets 205 03/08/2020: Hemoglobin 13.9; Hemoglobin 14.3; Potassium 3.6; Potassium 3.7; Sodium 146; Sodium 146    Lipid Panel    Component Value Date/Time   CHOL 128 02/12/2020 1053   TRIG 246 (H) 02/12/2020 1053   HDL 37 (L) 02/12/2020 1053   CHOLHDL 3.5 02/12/2020 1053   LDLCALC 52 02/12/2020 1053       Other studies Reviewed: Additional studies/ records that were reviewed today include:. Cardiac cath 03/08/20  Prox RCA lesion is 30% stenosed.  Mid RCA to Dist RCA lesion is 20% stenosed.  Prox LAD to Mid LAD lesion is 20% stenosed.   1. Mild non-obstructive CAD 2. Normal right and left heart pressures.   Recommendations: Medical management of mild non-obstructive CAD.   ECHO 01/01/20 IMPRESSIONS    1. Left ventricular ejection fraction, by estimation, is 30 to 35%. The  left ventricle has moderately decreased function. The left ventricle  demonstrates global hypokinesis. The left ventricular internal cavity size  was mildly dilated. Left ventricular  diastolic parameters are consistent with Grade I diastolic dysfunction  (impaired relaxation).  2. Right ventricular systolic function is normal. The right ventricular  size is normal.  3. The mitral valve is normal in structure. No evidence of mitral valve  regurgitation. No evidence of mitral stenosis.  4. The aortic valve is normal in structure. Aortic valve  regurgitation is  not visualized. No aortic stenosis is present.  5. Aortic dilatation noted. There is mild dilatation of the ascending  aorta measuring 39 mm.   FINDINGS  Left Ventricle: Left ventricular ejection fraction, by estimation, is 30  to 35%. The left ventricle has moderately decreased function. The left  ventricle demonstrates global hypokinesis. The left ventricular internal  cavity size was mildly dilated.  There is no left ventricular hypertrophy. Left ventricular diastolic  parameters are consistent with Grade I diastolic dysfunction (impaired  relaxation).   Right Ventricle: The right ventricular size is normal. No increase in  right ventricular wall thickness. Right ventricular systolic function is  normal.   Left Atrium: Left atrial size was normal in size.   Right Atrium: Right atrial size was normal in size.   Pericardium: There is no evidence of pericardial effusion.   Mitral Valve: The mitral valve is normal in structure. No evidence of  mitral valve regurgitation. No evidence of mitral valve stenosis.   Tricuspid Valve: The tricuspid valve is grossly normal. Tricuspid valve  regurgitation is trivial. No evidence of tricuspid stenosis.   Aortic Valve: The aortic valve is normal in structure. Aortic valve  regurgitation is not visualized. No aortic stenosis is present.   Pulmonic Valve: The pulmonic valve was normal in structure. Pulmonic valve  regurgitation is not visualized. No evidence of pulmonic stenosis.   Aorta: Aortic dilatation noted. There is mild dilatation of the ascending  aorta measuring 39 mm.   IAS/Shunts: The atrial septum is grossly normal.     LEFT VENTRICLE  PLAX 2D  LVIDd:     4.90 cm Diastology  LVIDs:     4.20 cm LV e' lateral:  5.64 cm/s  LV PW:     1.10 cm LV E/e' lateral: 10.7  LV IVS:    1.50 cm LV e' medial:  4.57 cm/s  LVOT diam:   2.45 cm LV E/e' medial: 13.2  LV SV:     81  LV SV  Index:  42  LVOT Area:   4.71  cm     RIGHT VENTRICLE  RV Basal diam: 2.10 cm  RV S prime:   14.70 cm/s  TAPSE (M-mode): 2.4 cm   LEFT ATRIUM       Index    RIGHT ATRIUM     Index  LA diam:    3.40 cm 1.75 cm/m RA Area:   9.63 cm  LA Vol (A2C):  51.2 ml 26.28 ml/m RA Volume:  19.40 ml 9.96 ml/m  LA Vol (A4C):  29.7 ml 15.25 ml/m  LA Biplane Vol: 42.0 ml 21.56 ml/m  AORTIC VALVE  LVOT Vmax:  84.20 cm/s  LVOT Vmean: 51.000 cm/s  LVOT VTI:  0.172 m    AORTA  Ao Root diam: 3.40 cm   MITRAL VALVE  MV Area (PHT): 2.73 cm  SHUNTS  MV Decel Time: 278 msec  Systemic VTI: 0.17 m  MV E velocity: 60.40 cm/s Systemic Diam: 2.45 cm  MV A velocity: 84.40 cm/s  MV E/A ratio: 0.72   ASSESSMENT AND PLAN:  1.  NICM with EF 30-35%, on coreg 6.25 BID and entresto 97-103 BP on lower end.  NYHA class I-II has been doing well and euvolemic, will recheck Echo then follow up with Dr. Johnsie Cancel if EF still low I briefly discussed ICD with pt, his father did have one. If EF still lower end spironolactone would be considered.  2.  Mild nonobstructive CAD.  On cath continue statin and BB  3.  HLD continue statin  4.  Tobacco use has decreased but not yet stopped  5.  COPD/dyspnea stable and followed by pulmonary.    Current medicines are reviewed with the patient today.  The patient Has no concerns regarding medicines.  The following changes have been made:  See above Labs/ tests ordered today include:see above  Disposition:   FU:  see above  Signed, Cecilie Kicks, NP  03/28/2020 10:07 AM    [facility], [place], [place], [place] Phone: [phone]; Fax: [phone]

## 2020-03-28 ENCOUNTER — Other Ambulatory Visit: Payer: Self-pay

## 2020-03-28 ENCOUNTER — Ambulatory Visit: Payer: Medicare Other | Admitting: Cardiology

## 2020-03-28 ENCOUNTER — Encounter: Payer: Self-pay | Admitting: Cardiology

## 2020-03-28 VITALS — BP 114/70 | HR 72 | Ht 71.0 in | Wt 185.4 lb

## 2020-03-28 DIAGNOSIS — E782 Mixed hyperlipidemia: Secondary | ICD-10-CM

## 2020-03-28 DIAGNOSIS — J449 Chronic obstructive pulmonary disease, unspecified: Secondary | ICD-10-CM | POA: Diagnosis not present

## 2020-03-28 DIAGNOSIS — Z720 Tobacco use: Secondary | ICD-10-CM

## 2020-03-28 DIAGNOSIS — I428 Other cardiomyopathies: Secondary | ICD-10-CM | POA: Diagnosis not present

## 2020-03-28 DIAGNOSIS — I25118 Atherosclerotic heart disease of native coronary artery with other forms of angina pectoris: Secondary | ICD-10-CM | POA: Diagnosis not present

## 2020-03-28 DIAGNOSIS — R931 Abnormal findings on diagnostic imaging of heart and coronary circulation: Secondary | ICD-10-CM | POA: Diagnosis not present

## 2020-03-28 NOTE — Patient Instructions (Addendum)
Medication Instructions:  Your physician recommends that you continue on your current medications as directed. Please refer to the Current Medication list given to you today.  *If you need a refill on your cardiac medications before your next appointment, please call your pharmacy*   Lab Work: None ordered  If you have labs (blood work) drawn today and your tests are completely normal, you will receive your results only by: Marland Kitchen MyChart Message (if you have MyChart) OR . A paper copy in the mail If you have any lab test that is abnormal or we need to change your treatment, we will call you to review the results.   Testing/Procedures: Your physician has requested that you have an echocardiogram. Echocardiography is a painless test that uses sound waves to create images of your heart. It provides your doctor with information about the size and shape of your heart and how well your heart's chambers and valves are working. This procedure takes approximately one hour. There are no restrictions for this procedure.     Follow-Up: At [facility], you and your health needs are our priority.  As part of our continuing mission to provide you with exceptional heart care, we have created designated Provider Care Teams.  These Care Teams include your primary Cardiologist (physician) and Advanced Practice Providers (APPs -  Physician Assistants and Nurse Practitioners) who all work together to provide you with the care you need, when you need it.  We recommend signing up for the patient portal called "MyChart".  Sign up information is provided on this After Visit Summary.  MyChart is used to connect with patients for Virtual Visits (Telemedicine).  Patients are able to view lab/test results, encounter notes, upcoming appointments, etc.  Non-urgent messages can be sent to your provider as well.   To learn more about what you can do with MyChart, go to [email or web address].    Your next appointment:    2 month(s)  05/30/20 ARRIVE AT 9:30 TO SEE DR. NISHAN    The format for your next appointment:   In Person  Provider:   You may see Jenkins Rouge, MD or one of the following Advanced Practice Providers on your designated Care Team:    Truitt Merle, NP  Cecilie Kicks, NP  Kathyrn Drown, NP    Other Instructions  Echocardiogram An echocardiogram is a procedure that uses painless sound waves (ultrasound) to produce an image of the heart. Images from an echocardiogram can provide important information about:  Signs of coronary artery disease (CAD).  Aneurysm detection. An aneurysm is a weak or damaged part of an artery wall that bulges out from the normal force of blood pumping through the body.  Heart size and shape. Changes in the size or shape of the heart can be associated with certain conditions, including heart failure, aneurysm, and CAD.  Heart muscle function.  Heart valve function.  Signs of a past heart attack.  Fluid buildup around the heart.  Thickening of the heart muscle.  A tumor or infectious growth around the heart valves. Tell a health care provider about:  Any allergies you have.  All medicines you are taking, including vitamins, herbs, eye drops, creams, and over-the-counter medicines.  Any blood disorders you have.  Any surgeries you have had.  Any medical conditions you have.  Whether you are pregnant or may be pregnant. What are the risks? Generally, this is a safe procedure. However, problems may occur, including:  Allergic reaction to dye (contrast) that may  be used during the procedure. What happens before the procedure? No specific preparation is needed. You may eat and drink normally. What happens during the procedure?   An IV tube may be inserted into one of your veins.  You may receive contrast through this tube. A contrast is an injection that improves the quality of the pictures from your heart.  A gel will be applied to your  chest.  A wand-like tool (transducer) will be moved over your chest. The gel will help to transmit the sound waves from the transducer.  The sound waves will harmlessly bounce off of your heart to allow the heart images to be captured in real-time motion. The images will be recorded on a computer. The procedure may vary among health care providers and hospitals. What happens after the procedure?  You may return to your normal, everyday life, including diet, activities, and medicines, unless your health care provider tells you not to do that. Summary  An echocardiogram is a procedure that uses painless sound waves (ultrasound) to produce an image of the heart.  Images from an echocardiogram can provide important information about the size and shape of your heart, heart muscle function, heart valve function, and fluid buildup around your heart.  You do not need to do anything to prepare before this procedure. You may eat and drink normally.  After the echocardiogram is completed, you may return to your normal, everyday life, unless your health care provider tells you not to do that. This information is not intended to replace advice given to you by your health care provider. Make sure you discuss any questions you have with your health care provider. Document Revised: 01/05/2019 Document Reviewed: 10/17/2016 Elsevier Patient Education  [place].

## 2020-04-22 ENCOUNTER — Ambulatory Visit (HOSPITAL_COMMUNITY): Payer: Medicare Other | Attending: Internal Medicine

## 2020-04-22 ENCOUNTER — Other Ambulatory Visit: Payer: Self-pay

## 2020-04-22 DIAGNOSIS — I428 Other cardiomyopathies: Secondary | ICD-10-CM | POA: Diagnosis not present

## 2020-04-22 DIAGNOSIS — I25118 Atherosclerotic heart disease of native coronary artery with other forms of angina pectoris: Secondary | ICD-10-CM | POA: Diagnosis not present

## 2020-04-22 DIAGNOSIS — R931 Abnormal findings on diagnostic imaging of heart and coronary circulation: Secondary | ICD-10-CM | POA: Diagnosis not present

## 2020-04-22 LAB — ECHOCARDIOGRAM COMPLETE
Area-P 1/2: 2.5 cm2
S' Lateral: 3.5 cm

## 2020-05-27 NOTE — Progress Notes (Signed)
Cardiology Office Note   Date:  05/29/2020   ID:  Mark Tate, DOB 1951-05-15, MRN [id number]  PCP:  Aurea Graff.Marlou Sa, MD  Cardiologist:  Dr. Johnsie Cancel     No chief complaint on file.     History of Present Illness: Mark Tate is a 69 y.o. male who presents for f/u of DCM  Hx of CADon CT of chest. Smoker with 45 pack year history Seen by pulmonary Ramaswamy Rx with spiriva and symbicort Lung cancer screening CT done 05/23/19 no cancer Commented on LM coronary calcification and aortic atherosclerosis with focal dilatation of the descending thoracic aorta 4.3 cm moderate bullous emphysema This was also noted on my review of CT done 03/09/18 The calcification is very mild with no other calcium noted in the coronary arteries to my review  He has no exertional chest pain  Some exertional dyspnea from COPD Has had COVID vaccine Has two daughters from two marriages and 10 grand children. 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  12/21/19 Myoview suggest old infarct  01/01/20 Echo with decreased, EF to 30-35%, G1 DD    Started on coreg and entresto    03/08/20 Cath with pRCA 30 %, mid RCA to dist RCA 20% stenosed, porx LAD to mLAD 20% stenosis, normal Rt and Left heart pressure. Done 03/08/20  Repeat echo done 04/22/20 EF normalized 60-65% on meds   Has had vaccine doing well Has enough energy to paint his own house !!  Past Medical History:  Diagnosis Date  . Allergies   . Asthma   . CAD in native artery 01/03/2020  . COPD (chronic obstructive pulmonary disease) ([place])   . COPD (chronic obstructive pulmonary disease) ([place]) 01/03/2020  . Coughing   . Fatigue   . Neck swelling   . Skin cancer   . SOB (shortness of breath) on exertion   . Tobacco use 01/03/2020  . Wheezing     Past Surgical History:  Procedure Laterality Date  . BASAL CELL CARCINOMA EXCISION     NECK  . CATARACT EXTRACTION    . MOHS SURGERY  2013, 2019   ON SCALP AND LEFT ARM   . RIGHT/LEFT HEART CATH  AND CORONARY ANGIOGRAPHY N/A 03/08/2020   Procedure: RIGHT/LEFT HEART CATH AND CORONARY ANGIOGRAPHY;  Surgeon: Burnell Blanks, MD;  Location: [facility];  Service: Cardiovascular;  Laterality: N/A;     Current Outpatient Medications  Medication Sig Dispense Refill  . acetaminophen (TYLENOL) 500 MG tablet Take 500 mg by mouth every 6 (six) hours as needed for headache.     . albuterol (VENTOLIN HFA) 108 (90 Base) MCG/ACT inhaler Inhale 1-2 puffs into the lungs every 6 (six) hours as needed for wheezing or shortness of breath.     . Ascorbic Acid (VITAMIN C) 1000 MG tablet Take 1,000 mg by mouth daily.    Marland Kitchen aspirin EC 81 MG tablet Take 1 tablet (81 mg total) by mouth daily. 90 tablet 3  . atorvastatin (LIPITOR) 40 MG tablet Take 1 tablet (40 mg total) by mouth daily. 90 tablet 3  . carvedilol (COREG) 6.25 MG tablet Take 1 tablet (6.25 mg total) by mouth 2 (two) times daily with a meal. 180 tablet 3  . cetirizine (ZYRTEC) 10 MG tablet Take 10 mg by mouth daily.    . cholecalciferol (VITAMIN D3) 25 MCG (1000 UNIT) tablet Take 1,000 Units by mouth daily.    . Multiple Vitamins-Minerals (CENTRUM SILVER 50+MEN) TABS Take  1 tablet by mouth daily.    . Omega-3 Fatty Acids (FISH OIL) 1000 MG CAPS Take 1,000 mg by mouth daily.     . sacubitril-valsartan (ENTRESTO) 97-103 MG Take 1 tablet by mouth 2 (two) times daily. 60 tablet 11  . SYMBICORT 160-4.5 MCG/ACT inhaler Inhale 2 puffs into the lungs in the morning and at bedtime.      No current facility-administered medications for this visit.    Allergies:   Patient has no known allergies.    Social History:  The patient  reports that he has been smoking cigarettes. He has a 45.00 pack-year smoking history. He has never used smokeless tobacco.   Family History:  The patient's family history includes Cancer in his father and mother; Cancer - Lung in his maternal aunt.    ROS:  General:no colds or fevers, no weight changes Skin:no  rashes or ulcers HEENT:no blurred vision, no congestion CV:see HPI PUL:see HPI GI:no diarrhea constipation or melena, no indigestion GU:no hematuria, no dysuria MS:no joint pain, no claudication Neuro:no syncope, no lightheadedness Endo:no diabetes, no thyroid disease  Wt Readings from Last 3 Encounters:  05/29/20 188 lb (85.3 kg)  03/28/20 185 lb 6.4 oz (84.1 kg)  03/08/20 180 lb (81.6 kg)     PHYSICAL EXAM: BP 120/80   Pulse 80   Ht 5\' 11"  (1.803 m)   Wt 188 lb (85.3 kg)   BMI 26.22 kg/m  Affect appropriate Healthy:  appears stated age 84: normal Neck supple with no adenopathy JVP normal no bruits no thyromegaly Lungs clear with no wheezing and good diaphragmatic motion Heart:  S1/S2 no murmur, no rub, gallop or click PMI normal Abdomen: benighn, BS positve, no tenderness, no AAA no bruit.  No HSM or HJR Distal pulses intact with no bruits No edema Neuro non-focal Skin warm and dry No muscular weakness  EKG:   02/12/20 SR rate 71 normal     Recent Labs: 01/22/2020: NT-Pro BNP 180 02/12/2020: ALT 18 02/27/2020: BUN 14; Creatinine, Ser 1.08; Platelets 205 03/08/2020: Hemoglobin 13.9; Hemoglobin 14.3; Potassium 3.6; Potassium 3.7; Sodium 146; Sodium 146    Lipid Panel    Component Value Date/Time   CHOL 128 02/12/2020 1053   TRIG 246 (H) 02/12/2020 1053   HDL 37 (L) 02/12/2020 1053   CHOLHDL 3.5 02/12/2020 1053   LDLCALC 52 02/12/2020 1053       Other studies Reviewed: Additional studies/ records that were reviewed today include:. Cardiac cath 03/08/20  Prox RCA lesion is 30% stenosed.  Mid RCA to Dist RCA lesion is 20% stenosed.  Prox LAD to Mid LAD lesion is 20% stenosed.   1. Mild non-obstructive CAD 2. Normal right and left heart pressures.   Recommendations: Medical management of mild non-obstructive CAD.   Echo:  04/22/20 IMPRESSIONS    1. COmpared to echo from April 2021 LVEF is improved.  2. Left ventricular ejection fraction, by  estimation, is 60 to 65%. The  left ventricle has normal function. The left ventricle has no regional  wall motion abnormalities. Left ventricular diastolic parameters are  indeterminate.  3. Right ventricular systolic function is normal. The right ventricular  size is normal.  4. The mitral valve is normal in structure. Trivial mitral valve  regurgitation.  5. The aortic valve is normal in structure. Aortic valve regurgitation is  not visualized.   ASSESSMENT AND PLAN:  1.  NICM with EF 30-35%, on coreg 6.25 BID and entresto 97-103  TTE done 04/22/20 EF  60-65% continue medical Rx no indication for AICD   2.  Mild nonobstructive CAD.  On cath continue statin and BB  3.  HLD continue statin  4.  Tobacco use has decreased but not yet stopped  5.  COPD/dyspnea stable and followed by pulmonary. Lung cancer screening CT last 05/23/19 ok    Current medicines are reviewed with the patient today.  The patient Has no concerns regarding medicines.  The following changes have been made:  See above Labs/ tests ordered today include:see above  Disposition:   FU: 6 months   Signed, Jenkins Rouge, MD  05/29/2020 10:10 AM    [facility] [place], [place], [place], [place] Phone: [phone]; Fax: [phone]

## 2020-05-29 ENCOUNTER — Ambulatory Visit: Payer: Medicare Other | Admitting: Cardiovascular Disease

## 2020-05-29 ENCOUNTER — Encounter: Payer: Self-pay | Admitting: Cardiovascular Disease

## 2020-05-29 ENCOUNTER — Other Ambulatory Visit: Payer: Self-pay

## 2020-05-29 VITALS — BP 120/80 | HR 80 | Ht 71.0 in | Wt 188.0 lb

## 2020-05-29 DIAGNOSIS — I428 Other cardiomyopathies: Secondary | ICD-10-CM

## 2020-05-29 NOTE — Patient Instructions (Signed)

## 2020-05-30 ENCOUNTER — Ambulatory Visit: Payer: Medicare Other | Admitting: Cardiovascular Disease

## 2020-10-04 DIAGNOSIS — C44212 Basal cell carcinoma of skin of right ear and external auricular canal: Secondary | ICD-10-CM | POA: Diagnosis not present

## 2020-11-13 DIAGNOSIS — D044 Carcinoma in situ of skin of scalp and neck: Secondary | ICD-10-CM | POA: Diagnosis not present

## 2020-11-13 DIAGNOSIS — L570 Actinic keratosis: Secondary | ICD-10-CM | POA: Diagnosis not present

## 2020-11-13 DIAGNOSIS — D485 Neoplasm of uncertain behavior of skin: Secondary | ICD-10-CM | POA: Diagnosis not present

## 2020-11-20 NOTE — Progress Notes (Signed)
Cardiology Office Note   Date:  11/27/2020   ID:  [name], Mark Tate 06-22-1951, MRN [id number]  PCP:  Aurea Graff.Marlou Sa, MD  Cardiologist:  Dr. Johnsie Cancel     No chief complaint on file.     History of Present Illness: Mark Tate is a 70 y.o. male who presents for f/u of DCM  Hx of CADon CT of chest. Smoker with 45 pack year history Seen by pulmonary Ramaswamy Rx with spiriva and symbicort Lung cancer screening CT done 05/23/19 no cancer Commented on LM coronary calcification and aortic atherosclerosis with focal dilatation of the descending thoracic aorta 4.3 cm moderate bullous emphysema This was also noted on my review of CT done 03/09/18 The calcification is very mild with no other calcium noted in the coronary arteries to my review  He has no exertional chest pain  Some exertional dyspnea from COPD Has had COVID vaccine Has two daughters from two marriages and 10 grand children. 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  12/21/19 Myoview suggest old infarct  01/01/20 Echo with decreased, EF to 30-35%, G1 DD    Started on coreg and entresto   03/08/20 Cath with pRCA 30 %, mid RCA to dist RCA 20% stenosed, porx LAD to mLAD 20% stenosis, normal Rt and Left heart pressure. Done 03/08/20  Repeat echo done 04/22/20 EF normalized 60-65% on meds   He is vaccinated I take care of his brother Earlin Sweeden who is in [place] now    Past Medical History:  Diagnosis Date  . Allergies   . Asthma   . CAD in native artery 01/03/2020  . COPD (chronic obstructive pulmonary disease) ([place])   . COPD (chronic obstructive pulmonary disease) ([place]) 01/03/2020  . Coughing   . Fatigue   . Neck swelling   . Skin cancer   . SOB (shortness of breath) on exertion   . Tobacco use 01/03/2020  . Wheezing     Past Surgical History:  Procedure Laterality Date  . BASAL CELL CARCINOMA EXCISION     NECK  . CATARACT EXTRACTION    . MOHS SURGERY  2013, 2019   ON SCALP AND LEFT ARM   . RIGHT/LEFT  HEART CATH AND CORONARY ANGIOGRAPHY N/A 03/08/2020   Procedure: RIGHT/LEFT HEART CATH AND CORONARY ANGIOGRAPHY;  Surgeon: Burnell Blanks, MD;  Location: [facility];  Service: Cardiovascular;  Laterality: N/A;     Current Outpatient Medications  Medication Sig Dispense Refill  . acetaminophen (TYLENOL) 500 MG tablet Take 500 mg by mouth every 6 (six) hours as needed for headache.     . albuterol (VENTOLIN HFA) 108 (90 Base) MCG/ACT inhaler Inhale 1-2 puffs into the lungs every 6 (six) hours as needed for wheezing or shortness of breath.     . Ascorbic Acid (VITAMIN C) 1000 MG tablet Take 1,000 mg by mouth daily.    Marland Kitchen aspirin EC 81 MG tablet Take 1 tablet (81 mg total) by mouth daily. 90 tablet 3  . carvedilol (COREG) 6.25 MG tablet Take 1 tablet (6.25 mg total) by mouth 2 (two) times daily with a meal. 180 tablet 3  . cetirizine (ZYRTEC) 10 MG tablet Take 10 mg by mouth daily.    . cholecalciferol (VITAMIN D3) 25 MCG (1000 UNIT) tablet Take 1,000 Units by mouth daily.    . Multiple Vitamins-Minerals (CENTRUM SILVER 50+MEN) TABS Take 1 tablet by mouth daily.    . Omega-3 Fatty Acids (FISH OIL) 1000 MG  CAPS Take 1,000 mg by mouth daily.     . sacubitril-valsartan (ENTRESTO) 97-103 MG Take 1 tablet by mouth 2 (two) times daily. 60 tablet 11  . SYMBICORT 160-4.5 MCG/ACT inhaler Inhale 2 puffs into the lungs in the morning and at bedtime.     Marland Kitchen atorvastatin (LIPITOR) 40 MG tablet Take 1 tablet (40 mg total) by mouth daily. 90 tablet 3   No current facility-administered medications for this visit.    Allergies:   Patient has no known allergies.    Social History:  The patient  reports that he has been smoking cigarettes. He has a 45.00 pack-year smoking history. He has never used smokeless tobacco.   Family History:  The patient's family history includes Cancer in his father and mother; Cancer - Lung in his maternal aunt.    ROS:  General:no colds or fevers, no weight  changes Skin:no rashes or ulcers HEENT:no blurred vision, no congestion CV:see HPI PUL:see HPI GI:no diarrhea constipation or melena, no indigestion GU:no hematuria, no dysuria MS:no joint pain, no claudication Neuro:no syncope, no lightheadedness Endo:no diabetes, no thyroid disease  Wt Readings from Last 3 Encounters:  11/27/20 85.7 kg  05/29/20 85.3 kg  03/28/20 84.1 kg     PHYSICAL EXAM: BP 110/72   Pulse 72   Ht 5\' 11"  (1.803 m)   Wt 85.7 kg   SpO2 98%   BMI 26.36 kg/m  Affect appropriate Healthy:  appears stated age 43: normal Neck supple with no adenopathy JVP normal no bruits no thyromegaly Lungs clear with no wheezing and good diaphragmatic motion Heart:  S1/S2 no murmur, no rub, gallop or click PMI normal Abdomen: benighn, BS positve, no tenderness, no AAA no bruit.  No HSM or HJR Distal pulses intact with no bruits No edema Neuro non-focal Skin warm and dry No muscular weakness  EKG:   02/12/20 SR rate 71 normal     Recent Labs: 01/22/2020: NT-Pro BNP 180 02/12/2020: ALT 18 02/27/2020: BUN 14; Creatinine, Ser 1.08; Platelets 205 03/08/2020: Hemoglobin 13.9; Hemoglobin 14.3; Potassium 3.6; Potassium 3.7; Sodium 146; Sodium 146    Lipid Panel    Component Value Date/Time   CHOL 128 02/12/2020 1053   TRIG 246 (H) 02/12/2020 1053   HDL 37 (L) 02/12/2020 1053   CHOLHDL 3.5 02/12/2020 1053   LDLCALC 52 02/12/2020 1053       Other studies Reviewed: Additional studies/ records that were reviewed today include:. Cardiac cath 03/08/20  Prox RCA lesion is 30% stenosed.  Mid RCA to Dist RCA lesion is 20% stenosed.  Prox LAD to Mid LAD lesion is 20% stenosed.   1. Mild non-obstructive CAD 2. Normal right and left heart pressures.   Recommendations: Medical management of mild non-obstructive CAD.   Echo:  04/22/20 IMPRESSIONS    1. COmpared to echo from April 2021 LVEF is improved.  2. Left ventricular ejection fraction, by estimation, is  60 to 65%. The  left ventricle has normal function. The left ventricle has no regional  wall motion abnormalities. Left ventricular diastolic parameters are  indeterminate.  3. Right ventricular systolic function is normal. The right ventricular  size is normal.  4. The mitral valve is normal in structure. Trivial mitral valve  regurgitation.  5. The aortic valve is normal in structure. Aortic valve regurgitation is  not visualized.   ASSESSMENT AND PLAN:  1.  NICM with EF 30-35%, on coreg 6.25 BID and entresto 97-103  TTE done 04/22/20 EF 60-65% continue medical  Rx no indication for AICD Improved   2.  Mild nonobstructive CAD.  On cath continue statin and BB  3.  HLD continue statin  4.  Tobacco use has decreased but not yet stopped  5.  COPD/dyspnea stable and followed by pulmonary. Lung cancer screening CT last 05/23/19 ok    Current medicines are reviewed with the patient today.  The patient Has no concerns regarding medicines.  The following changes have been made:  See above Labs/ tests ordered today include:see above  Disposition:   FU: 6 months   Signed, Jenkins Rouge, MD  11/27/2020 8:33 AM    [facility] [place], [place], [place], [place] Phone: [phone]; Fax: [phone]

## 2020-11-27 ENCOUNTER — Other Ambulatory Visit: Payer: Self-pay

## 2020-11-27 ENCOUNTER — Encounter: Payer: Self-pay | Admitting: Cardiovascular Disease

## 2020-11-27 ENCOUNTER — Ambulatory Visit: Payer: Medicare Other | Admitting: Cardiovascular Disease

## 2020-11-27 VITALS — BP 110/72 | HR 72 | Ht 71.0 in | Wt 189.0 lb

## 2020-11-27 DIAGNOSIS — I420 Dilated cardiomyopathy: Secondary | ICD-10-CM

## 2020-11-27 NOTE — Patient Instructions (Signed)

## 2020-12-16 ENCOUNTER — Other Ambulatory Visit: Payer: Self-pay | Admitting: Cardiology

## 2020-12-18 DIAGNOSIS — H43813 Vitreous degeneration, bilateral: Secondary | ICD-10-CM | POA: Diagnosis not present

## 2021-01-02 DIAGNOSIS — I2510 Atherosclerotic heart disease of native coronary artery without angina pectoris: Secondary | ICD-10-CM | POA: Diagnosis not present

## 2021-01-02 DIAGNOSIS — Z1389 Encounter for screening for other disorder: Secondary | ICD-10-CM | POA: Diagnosis not present

## 2021-01-02 DIAGNOSIS — J449 Chronic obstructive pulmonary disease, unspecified: Secondary | ICD-10-CM | POA: Diagnosis not present

## 2021-01-02 DIAGNOSIS — E7800 Pure hypercholesterolemia, unspecified: Secondary | ICD-10-CM | POA: Diagnosis not present

## 2021-01-02 DIAGNOSIS — I428 Other cardiomyopathies: Secondary | ICD-10-CM | POA: Diagnosis not present

## 2021-01-09 DIAGNOSIS — H26492 Other secondary cataract, left eye: Secondary | ICD-10-CM | POA: Diagnosis not present

## 2021-01-09 DIAGNOSIS — H02831 Dermatochalasis of right upper eyelid: Secondary | ICD-10-CM | POA: Diagnosis not present

## 2021-01-09 DIAGNOSIS — H18413 Arcus senilis, bilateral: Secondary | ICD-10-CM | POA: Diagnosis not present

## 2021-01-09 DIAGNOSIS — Z961 Presence of intraocular lens: Secondary | ICD-10-CM | POA: Diagnosis not present

## 2021-01-15 DIAGNOSIS — Z9842 Cataract extraction status, left eye: Secondary | ICD-10-CM | POA: Diagnosis not present

## 2021-02-19 ENCOUNTER — Other Ambulatory Visit: Payer: Self-pay | Admitting: Cardiovascular Disease

## 2021-02-23 ENCOUNTER — Other Ambulatory Visit: Payer: Self-pay | Admitting: Cardiology

## 2021-03-04 ENCOUNTER — Emergency Department (HOSPITAL_COMMUNITY): Payer: Medicare Other

## 2021-03-04 ENCOUNTER — Other Ambulatory Visit: Payer: Self-pay

## 2021-03-04 ENCOUNTER — Inpatient Hospital Stay (HOSPITAL_COMMUNITY)
Admission: EM | Admit: 2021-03-04 | Discharge: 2021-03-13 | DRG: 190 | Disposition: A | Discharge status: Home Health | Payer: Medicare Other | Attending: Internal Medicine | Admitting: Internal Medicine

## 2021-03-04 DIAGNOSIS — Z79899 Other long term (current) drug therapy: Secondary | ICD-10-CM | POA: Diagnosis not present

## 2021-03-04 DIAGNOSIS — Z720 Tobacco use: Secondary | ICD-10-CM | POA: Diagnosis present

## 2021-03-04 DIAGNOSIS — Z7982 Long term (current) use of aspirin: Secondary | ICD-10-CM

## 2021-03-04 DIAGNOSIS — F17210 Nicotine dependence, cigarettes, uncomplicated: Secondary | ICD-10-CM | POA: Diagnosis not present

## 2021-03-04 DIAGNOSIS — J309 Allergic rhinitis, unspecified: Secondary | ICD-10-CM | POA: Diagnosis not present

## 2021-03-04 DIAGNOSIS — J441 Chronic obstructive pulmonary disease with (acute) exacerbation: Secondary | ICD-10-CM | POA: Diagnosis not present

## 2021-03-04 DIAGNOSIS — I420 Dilated cardiomyopathy: Secondary | ICD-10-CM | POA: Diagnosis present

## 2021-03-04 DIAGNOSIS — I5042 Chronic combined systolic (congestive) and diastolic (congestive) heart failure: Secondary | ICD-10-CM | POA: Diagnosis not present

## 2021-03-04 DIAGNOSIS — R531 Weakness: Secondary | ICD-10-CM | POA: Diagnosis not present

## 2021-03-04 DIAGNOSIS — F419 Anxiety disorder, unspecified: Secondary | ICD-10-CM | POA: Diagnosis not present

## 2021-03-04 DIAGNOSIS — J449 Chronic obstructive pulmonary disease, unspecified: Secondary | ICD-10-CM | POA: Diagnosis not present

## 2021-03-04 DIAGNOSIS — Z20822 Contact with and (suspected) exposure to covid-19: Secondary | ICD-10-CM | POA: Diagnosis not present

## 2021-03-04 DIAGNOSIS — J9602 Acute respiratory failure with hypercapnia: Secondary | ICD-10-CM | POA: Diagnosis present

## 2021-03-04 DIAGNOSIS — E871 Hypo-osmolality and hyponatremia: Secondary | ICD-10-CM | POA: Diagnosis present

## 2021-03-04 DIAGNOSIS — T502X5A Adverse effect of carbonic-anhydrase inhibitors, benzothiadiazides and other diuretics, initial encounter: Secondary | ICD-10-CM | POA: Diagnosis present

## 2021-03-04 DIAGNOSIS — E785 Hyperlipidemia, unspecified: Secondary | ICD-10-CM | POA: Diagnosis not present

## 2021-03-04 DIAGNOSIS — R0989 Other specified symptoms and signs involving the circulatory and respiratory systems: Secondary | ICD-10-CM | POA: Diagnosis not present

## 2021-03-04 DIAGNOSIS — J9811 Atelectasis: Secondary | ICD-10-CM | POA: Diagnosis not present

## 2021-03-04 DIAGNOSIS — R911 Solitary pulmonary nodule: Secondary | ICD-10-CM | POA: Diagnosis present

## 2021-03-04 DIAGNOSIS — R059 Cough, unspecified: Secondary | ICD-10-CM | POA: Diagnosis not present

## 2021-03-04 DIAGNOSIS — J439 Emphysema, unspecified: Secondary | ICD-10-CM | POA: Diagnosis not present

## 2021-03-04 DIAGNOSIS — R0602 Shortness of breath: Secondary | ICD-10-CM | POA: Diagnosis not present

## 2021-03-04 DIAGNOSIS — Z7951 Long term (current) use of inhaled steroids: Secondary | ICD-10-CM

## 2021-03-04 DIAGNOSIS — N179 Acute kidney failure, unspecified: Secondary | ICD-10-CM | POA: Diagnosis not present

## 2021-03-04 DIAGNOSIS — Z87891 Personal history of nicotine dependence: Secondary | ICD-10-CM | POA: Diagnosis present

## 2021-03-04 DIAGNOSIS — J9601 Acute respiratory failure with hypoxia: Secondary | ICD-10-CM | POA: Diagnosis present

## 2021-03-04 DIAGNOSIS — Z85828 Personal history of other malignant neoplasm of skin: Secondary | ICD-10-CM | POA: Diagnosis not present

## 2021-03-04 DIAGNOSIS — T501X5A Adverse effect of loop [high-ceiling] diuretics, initial encounter: Secondary | ICD-10-CM | POA: Diagnosis not present

## 2021-03-04 DIAGNOSIS — R0603 Acute respiratory distress: Secondary | ICD-10-CM | POA: Diagnosis not present

## 2021-03-04 DIAGNOSIS — I2510 Atherosclerotic heart disease of native coronary artery without angina pectoris: Secondary | ICD-10-CM | POA: Diagnosis not present

## 2021-03-04 DIAGNOSIS — I5032 Chronic diastolic (congestive) heart failure: Secondary | ICD-10-CM | POA: Diagnosis not present

## 2021-03-04 LAB — URINALYSIS, ROUTINE W REFLEX MICROSCOPIC
Bilirubin Urine: NEGATIVE
Glucose, UA: NEGATIVE mg/dL
Hgb urine dipstick: NEGATIVE
Ketones, ur: 5 mg/dL — AB
Leukocytes,Ua: NEGATIVE
Nitrite: NEGATIVE
Protein, ur: 30 mg/dL — AB
Specific Gravity, Urine: 1.024 (ref 1.005–1.030)
pH: 5 (ref 5.0–8.0)

## 2021-03-04 LAB — I-STAT VENOUS BLOOD GAS, ED
Acid-Base Excess: 0 mmol/L (ref 0.0–2.0)
Bicarbonate: 25.2 mmol/L (ref 20.0–28.0)
Calcium, Ion: 1.13 mmol/L — ABNORMAL LOW (ref 1.15–1.40)
HCT: 44 % (ref 39.0–52.0)
Hemoglobin: 15 g/dL (ref 13.0–17.0)
O2 Saturation: 92 %
Potassium: 4.5 mmol/L (ref 3.5–5.1)
Sodium: 135 mmol/L (ref 135–145)
TCO2: 26 mmol/L (ref 22–32)
pCO2, Ven: 41.4 mmHg — ABNORMAL LOW (ref 44.0–60.0)
pH, Ven: 7.392 (ref 7.250–7.430)
pO2, Ven: 64 mmHg — ABNORMAL HIGH (ref 32.0–45.0)

## 2021-03-04 LAB — HEPATIC FUNCTION PANEL
ALT: 21 U/L (ref 0–44)
AST: 45 U/L — ABNORMAL HIGH (ref 15–41)
Albumin: 3.6 g/dL (ref 3.5–5.0)
Alkaline Phosphatase: 62 U/L (ref 38–126)
Bilirubin, Direct: 0.2 mg/dL (ref 0.0–0.2)
Indirect Bilirubin: 0.9 mg/dL (ref 0.3–0.9)
Total Bilirubin: 1.1 mg/dL (ref 0.3–1.2)
Total Protein: 6.9 g/dL (ref 6.5–8.1)

## 2021-03-04 LAB — RESP PANEL BY RT-PCR (FLU A&B, COVID) ARPGX2
Influenza A by PCR: NEGATIVE
Influenza B by PCR: NEGATIVE
SARS Coronavirus 2 by RT PCR: NEGATIVE

## 2021-03-04 LAB — CBC WITH DIFFERENTIAL/PLATELET
Abs Immature Granulocytes: 0.06 10*3/uL (ref 0.00–0.07)
Basophils Absolute: 0 10*3/uL (ref 0.0–0.1)
Basophils Relative: 0 %
Eosinophils Absolute: 0.1 10*3/uL (ref 0.0–0.5)
Eosinophils Relative: 1 %
HCT: 47.5 % (ref 39.0–52.0)
Hemoglobin: 16.1 g/dL (ref 13.0–17.0)
Immature Granulocytes: 1 %
Lymphocytes Relative: 16 %
Lymphs Abs: 1.8 10*3/uL (ref 0.7–4.0)
MCH: 31.7 pg (ref 26.0–34.0)
MCHC: 33.9 g/dL (ref 30.0–36.0)
MCV: 93.5 fL (ref 80.0–100.0)
Monocytes Absolute: 1.2 10*3/uL — ABNORMAL HIGH (ref 0.1–1.0)
Monocytes Relative: 11 %
Neutro Abs: 8 10*3/uL — ABNORMAL HIGH (ref 1.7–7.7)
Neutrophils Relative %: 71 %
Platelets: 186 10*3/uL (ref 150–400)
RBC: 5.08 MIL/uL (ref 4.22–5.81)
RDW: 13.2 % (ref 11.5–15.5)
WBC: 11.2 10*3/uL — ABNORMAL HIGH (ref 4.0–10.5)
nRBC: 0 % (ref 0.0–0.2)

## 2021-03-04 LAB — TROPONIN I (HIGH SENSITIVITY)
Troponin I (High Sensitivity): 12 ng/L (ref <18)
Troponin I (High Sensitivity): 15 ng/L (ref <18)

## 2021-03-04 LAB — BASIC METABOLIC PANEL
Anion gap: 10 (ref 5–15)
BUN: 19 mg/dL (ref 8–23)
CO2: 22 mmol/L (ref 22–32)
Calcium: 9.1 mg/dL (ref 8.9–10.3)
Chloride: 101 mmol/L (ref 98–111)
Creatinine, Ser: 1.12 mg/dL (ref 0.61–1.24)
GFR, Estimated: >60 mL/min (ref >60)
Glucose, Bld: 117 mg/dL — ABNORMAL HIGH (ref 70–99)
Potassium: 4.3 mmol/L (ref 3.5–5.1)
Sodium: 133 mmol/L — ABNORMAL LOW (ref 135–145)

## 2021-03-04 LAB — PROCALCITONIN: Procalcitonin: <0.1 ng/mL

## 2021-03-04 LAB — LACTIC ACID, PLASMA
Lactic Acid, Venous: 1.1 mmol/L (ref 0.5–1.9)
Lactic Acid, Venous: 1.9 mmol/L (ref 0.5–1.9)

## 2021-03-04 LAB — BRAIN NATRIURETIC PEPTIDE: B Natriuretic Peptide: 59.3 pg/mL (ref 0.0–100.0)

## 2021-03-04 MED ORDER — METHYLPREDNISOLONE SODIUM SUCC 40 MG IJ SOLR
40.0000 mg | Freq: Four times a day (QID) | INTRAMUSCULAR | Status: DC
  Administered 2021-03-04 – 2021-03-05 (×3): 40 mg via INTRAVENOUS
  Filled 2021-03-04 (×3): qty 1

## 2021-03-04 MED ORDER — IPRATROPIUM-ALBUTEROL 0.5-2.5 (3) MG/3ML IN SOLN
3.0000 mL | Freq: Once | RESPIRATORY_TRACT | Status: AC
  Administered 2021-03-04: 3 mL via RESPIRATORY_TRACT
  Filled 2021-03-04: qty 3

## 2021-03-04 MED ORDER — BUDESONIDE 0.5 MG/2ML IN SUSP
0.5000 mg | Freq: Two times a day (BID) | RESPIRATORY_TRACT | Status: DC
  Administered 2021-03-04 – 2021-03-13 (×17): 0.5 mg via RESPIRATORY_TRACT
  Filled 2021-03-04 (×17): qty 2

## 2021-03-04 MED ORDER — SODIUM CHLORIDE 0.9 % IV SOLN
500.0000 mg | Freq: Once | INTRAVENOUS | Status: AC
  Administered 2021-03-04: 500 mg via INTRAVENOUS
  Filled 2021-03-04: qty 500

## 2021-03-04 MED ORDER — SODIUM CHLORIDE 0.9 % IV SOLN
1.0000 g | INTRAVENOUS | Status: AC
  Administered 2021-03-05 – 2021-03-09 (×5): 1 g via INTRAVENOUS
  Filled 2021-03-04 (×5): qty 10

## 2021-03-04 MED ORDER — METHYLPREDNISOLONE SODIUM SUCC 125 MG IJ SOLR
125.0000 mg | Freq: Once | INTRAMUSCULAR | Status: AC
  Administered 2021-03-04: 125 mg via INTRAVENOUS
  Filled 2021-03-04: qty 2

## 2021-03-04 MED ORDER — BUDESONIDE 0.5 MG/2ML IN SUSP: 0.5000 mg | Freq: Four times a day (QID) | RESPIRATORY_TRACT | Status: DC

## 2021-03-04 MED ORDER — ACETAMINOPHEN 500 MG PO TABS
1000.0000 mg | ORAL_TABLET | Freq: Once | ORAL | Status: DC
  Filled 2021-03-04: qty 2

## 2021-03-04 MED ORDER — SODIUM CHLORIDE 0.9 % IV SOLN
1.0000 g | Freq: Once | INTRAVENOUS | Status: AC
  Administered 2021-03-04: 1 g via INTRAVENOUS
  Filled 2021-03-04: qty 10

## 2021-03-04 MED ORDER — CARVEDILOL 3.125 MG PO TABS
3.1250 mg | ORAL_TABLET | Freq: Two times a day (BID) | ORAL | Status: DC
  Administered 2021-03-05: 3.125 mg via ORAL
  Filled 2021-03-04: qty 1

## 2021-03-04 MED ORDER — ALBUTEROL SULFATE (2.5 MG/3ML) 0.083% IN NEBU
INHALATION_SOLUTION | RESPIRATORY_TRACT | Status: AC
  Filled 2021-03-04: qty 3

## 2021-03-04 MED ORDER — MOMETASONE FURO-FORMOTEROL FUM 200-5 MCG/ACT IN AERO
2.0000 | INHALATION_SPRAY | Freq: Two times a day (BID) | RESPIRATORY_TRACT | Status: DC
  Administered 2021-03-05: 2 via RESPIRATORY_TRACT
  Filled 2021-03-04: qty 8.8

## 2021-03-04 MED ORDER — PREDNISONE 20 MG PO TABS: 40.0000 mg | ORAL_TABLET | Freq: Every day | ORAL | Status: DC

## 2021-03-04 MED ORDER — LORAZEPAM 2 MG/ML IJ SOLN
1.0000 mg | Freq: Once | INTRAMUSCULAR | Status: AC
  Administered 2021-03-04: 1 mg via INTRAVENOUS
  Filled 2021-03-04: qty 1

## 2021-03-04 MED ORDER — ALBUTEROL (5 MG/ML) CONTINUOUS INHALATION SOLN
10.0000 mg/h | INHALATION_SOLUTION | Freq: Once | RESPIRATORY_TRACT | Status: AC
  Administered 2021-03-04: 10 mg/h via RESPIRATORY_TRACT

## 2021-03-04 MED ORDER — IPRATROPIUM BROMIDE 0.02 % IN SOLN
0.5000 mg | Freq: Four times a day (QID) | RESPIRATORY_TRACT | Status: DC
  Administered 2021-03-04 – 2021-03-05 (×3): 0.5 mg via RESPIRATORY_TRACT
  Filled 2021-03-04 (×3): qty 2.5

## 2021-03-04 MED ORDER — ATORVASTATIN CALCIUM 40 MG PO TABS
40.0000 mg | ORAL_TABLET | Freq: Every day | ORAL | Status: DC
  Administered 2021-03-05 – 2021-03-13 (×9): 40 mg via ORAL
  Filled 2021-03-04 (×9): qty 1

## 2021-03-04 MED ORDER — IPRATROPIUM BROMIDE 0.02 % IN SOLN
1.0000 mg | Freq: Once | RESPIRATORY_TRACT | Status: AC
  Administered 2021-03-04: 1 mg via RESPIRATORY_TRACT
  Filled 2021-03-04: qty 5

## 2021-03-04 MED ORDER — SODIUM CHLORIDE 0.9 % IV SOLN
500.0000 mg | INTRAVENOUS | Status: AC
  Administered 2021-03-05 – 2021-03-09 (×5): 500 mg via INTRAVENOUS
  Filled 2021-03-04 (×5): qty 500

## 2021-03-04 MED ORDER — BUDESONIDE 0.5 MG/2ML IN SUSP
2.0000 mg | Freq: Four times a day (QID) | RESPIRATORY_TRACT | Status: DC
  Filled 2021-03-04: qty 8

## 2021-03-04 MED ORDER — ASPIRIN EC 81 MG PO TBEC
81.0000 mg | DELAYED_RELEASE_TABLET | Freq: Every day | ORAL | Status: DC
  Administered 2021-03-05 – 2021-03-13 (×9): 81 mg via ORAL
  Filled 2021-03-04 (×9): qty 1

## 2021-03-04 MED ORDER — ALBUTEROL SULFATE (2.5 MG/3ML) 0.083% IN NEBU
2.5000 mg | INHALATION_SOLUTION | Freq: Four times a day (QID) | RESPIRATORY_TRACT | Status: DC
  Administered 2021-03-05 (×2): 2.5 mg via RESPIRATORY_TRACT
  Filled 2021-03-04 (×2): qty 3

## 2021-03-04 MED ORDER — GUAIFENESIN ER 600 MG PO TB12
1200.0000 mg | ORAL_TABLET | Freq: Two times a day (BID) | ORAL | Status: DC
  Administered 2021-03-04 – 2021-03-13 (×18): 1200 mg via ORAL
  Filled 2021-03-04 (×18): qty 2

## 2021-03-04 MED ORDER — ALBUTEROL (5 MG/ML) CONTINUOUS INHALATION SOLN
10.0000 mg/h | INHALATION_SOLUTION | Freq: Once | RESPIRATORY_TRACT | Status: AC
  Administered 2021-03-04: 10 mg/h via RESPIRATORY_TRACT
  Filled 2021-03-04: qty 20

## 2021-03-04 MED ORDER — SACUBITRIL-VALSARTAN 97-103 MG PO TABS
1.0000 | ORAL_TABLET | Freq: Two times a day (BID) | ORAL | Status: DC
  Administered 2021-03-05 – 2021-03-13 (×17): 1 via ORAL
  Filled 2021-03-04 (×19): qty 1

## 2021-03-04 MED ORDER — NICOTINE 21 MG/24HR TD PT24
21.0000 mg | MEDICATED_PATCH | Freq: Every day | TRANSDERMAL | Status: DC
  Administered 2021-03-04 – 2021-03-13 (×10): 21 mg via TRANSDERMAL
  Filled 2021-03-04 (×10): qty 1

## 2021-03-04 MED ORDER — OMEGA-3-ACID ETHYL ESTERS 1 G PO CAPS
1.0000 g | ORAL_CAPSULE | Freq: Two times a day (BID) | ORAL | Status: DC
  Administered 2021-03-05 – 2021-03-13 (×17): 1 g via ORAL
  Filled 2021-03-04 (×17): qty 1

## 2021-03-04 MED ORDER — ENOXAPARIN SODIUM 40 MG/0.4ML IJ SOSY
40.0000 mg | PREFILLED_SYRINGE | INTRAMUSCULAR | Status: DC
  Administered 2021-03-04 – 2021-03-12 (×9): 40 mg via SUBCUTANEOUS
  Filled 2021-03-04 (×10): qty 0.4

## 2021-03-04 MED ORDER — MAGNESIUM SULFATE 2 GM/50ML IV SOLN
2.0000 g | Freq: Once | INTRAVENOUS | Status: AC
  Administered 2021-03-04: 2 g via INTRAVENOUS
  Filled 2021-03-04: qty 50

## 2021-03-04 NOTE — ED Provider Notes (Signed)
[facility] EMERGENCY DEPARTMENT Provider Note   CSN: [id number] Arrival date & time: 03/04/21  1005     History Chief Complaint  Patient presents with  . Shortness of Breath    Mark Tate is a 70 y.o. male.  HPI   Patient with history of COPD presents for shortness of breath.  Started about 5 days ago after spending the weekend with family members who had recently gotten over COVID-35.  Shortness of breath is terrible at rest but also worsens with any exertion.  COPD medicine is not helping with the shortness of breath at the moment.  Patient is not on home oxygen but is on 4 L now in the ED.  He reports increased coughing, fever, difficulty breathing.  He has not missed any doses of his COPD medicine at home.  Past Medical History:  Diagnosis Date  . Allergies   . Asthma   . CAD in native artery 01/03/2020  . COPD (chronic obstructive pulmonary disease) ([place])   . COPD (chronic obstructive pulmonary disease) ([place]) 01/03/2020  . Coughing   . Fatigue   . Neck swelling   . Skin cancer   . SOB (shortness of breath) on exertion   . Tobacco use 01/03/2020  . Wheezing     Patient Active Problem List   Diagnosis Date Noted  . COPD exacerbation ([place]) 03/04/2021  . Abnormal stress test   . CAD in native artery 01/03/2020  . Dilated cardiomyopathy ([place]) 01/03/2020  . COPD (chronic obstructive pulmonary disease) ([place]) 01/03/2020  . Tobacco use 01/03/2020    Past Surgical History:  Procedure Laterality Date  . BASAL CELL CARCINOMA EXCISION     NECK  . CATARACT EXTRACTION    . MOHS SURGERY  2013, 2019   ON SCALP AND LEFT ARM   . RIGHT/LEFT HEART CATH AND CORONARY ANGIOGRAPHY N/A 03/08/2020   Procedure: RIGHT/LEFT HEART CATH AND CORONARY ANGIOGRAPHY;  Surgeon: Burnell Blanks, MD;  Location: [facility];  Service: Cardiovascular;  Laterality: N/A;       Family History  Problem Relation Age of Onset  . Cancer Mother        BONE, LUNG  .  Cancer Father        COLON, PROSTATE  . Cancer - Lung Maternal Aunt     Social History   Tobacco Use  . Smoking status: Current Every Day Smoker    Packs/day: 1.00    Years: 45.00    Pack years: 45.00    Types: Cigarettes  . Smokeless tobacco: Never Used  Vaping Use  . Vaping Use: Never used    Home Medications Prior to Admission medications   Medication Sig Start Date End Date Taking? Authorizing Provider  acetaminophen (TYLENOL) 500 MG tablet Take 500 mg by mouth every 6 (six) hours as needed for headache.     [provider]  albuterol (VENTOLIN HFA) 108 (90 Base) MCG/ACT inhaler Inhale 1-2 puffs into the lungs every 6 (six) hours as needed for wheezing or shortness of breath.     [provider]  Ascorbic Acid (VITAMIN C) 1000 MG tablet Take 1,000 mg by mouth daily.    [provider]  aspirin EC 81 MG tablet Take 1 tablet (81 mg total) by mouth daily. 01/04/20   [name], NP  atorvastatin (LIPITOR) 40 MG tablet TAKE 1 TABLET BY MOUTH EVERY DAY 12/16/20   [name], MD  carvedilol (COREG) 6.25 MG tablet  TAKE 0.5 TABLETS (3.125 MG TOTAL) BY MOUTH 2 (TWO) TIMES DAILY WITH A MEAL. 02/19/21   [name], MD  cetirizine (ZYRTEC) 10 MG tablet Take 10 mg by mouth daily.    [provider]  cholecalciferol (VITAMIN D3) 25 MCG (1000 UNIT) tablet Take 1,000 Units by mouth daily.    [provider]  ENTRESTO 97-103 MG TAKE 1 TABLET BY MOUTH 2 TIMES DAILY. 02/26/21   [name], MD  Multiple Vitamins-Minerals (CENTRUM SILVER 50+MEN) TABS Take 1 tablet by mouth daily.    [provider]  Omega-3 Fatty Acids (FISH OIL) 1000 MG CAPS Take 1,000 mg by mouth daily.     [provider]  SYMBICORT 160-4.5 MCG/ACT inhaler Inhale 2 puffs into the lungs in the morning and at bedtime.  02/22/18   [provider]    Allergies    Patient has no known allergies.  Review of Systems   Review of Systems   Constitutional: Positive for fatigue. Negative for chills and fever.  HENT: Negative for ear pain and sore throat.   Eyes: Negative for pain and visual disturbance.  Respiratory: Positive for cough and shortness of breath.   Cardiovascular: Negative for chest pain, palpitations and leg swelling.  Gastrointestinal: Negative for abdominal pain and vomiting.  Genitourinary: Negative for dysuria and hematuria.  Musculoskeletal: Negative for arthralgias and back pain.  Skin: Negative for color change and rash.  Neurological: Negative for seizures and syncope.  All other systems reviewed and are negative.   Physical Exam Updated Vital Signs BP 114/78   Pulse 89   Temp 98.6 F (37 C) (Oral)   Resp (!) 21   SpO2 97%   Physical Exam Vitals and nursing note reviewed. Exam conducted with a chaperone present.  Constitutional:      Appearance: Normal appearance. He is well-developed.     Comments: Chronically ill appearing. On 4 L of O2 nasal cannula   HENT:     Head: Normocephalic and atraumatic.  Eyes:     General: No scleral icterus.       Right eye: No discharge.        Left eye: No discharge.     Extraocular Movements: Extraocular movements intact.     Pupils: Pupils are equal, round, and reactive to light.  Cardiovascular:     Rate and Rhythm: Normal rate and regular rhythm.     Pulses: Normal pulses.     Heart sounds: Normal heart sounds. No murmur heard. No friction rub. No gallop.   Pulmonary:     Effort: Pulmonary effort is normal. Tachypnea present. No respiratory distress.     Breath sounds: Examination of the right-middle field reveals wheezing. Examination of the left-middle field reveals wheezing. Examination of the right-lower field reveals wheezing. Examination of the left-lower field reveals wheezing. Wheezing present. No decreased breath sounds.     Comments: Able to speak in complete sentences. Diffuse wheezing through all lung fields  Abdominal:     General:  Abdomen is flat. Bowel sounds are normal. There is no distension.     Palpations: Abdomen is soft.     Tenderness: There is no abdominal tenderness.  Skin:    General: Skin is warm and dry.     Coloration: Skin is not jaundiced.  Neurological:     Mental Status: He is alert. Mental status is at baseline.     Coordination: Coordination normal.     ED Results / Procedures / Treatments  Labs (all labs ordered are listed, but only abnormal results are displayed) Labs Reviewed  CBC WITH DIFFERENTIAL/PLATELET - Abnormal; Notable for the following components:      Result Value   WBC 11.2 (*)    Neutro Abs 8.0 (*)    Monocytes Absolute 1.2 (*)    All other components within normal limits  BASIC METABOLIC PANEL - Abnormal; Notable for the following components:   Sodium 133 (*)    Glucose, Bld 117 (*)    All other components within normal limits  I-STAT VENOUS BLOOD GAS, ED - Abnormal; Notable for the following components:   pCO2, Ven 41.4 (*)    pO2, Ven 64.0 (*)    Calcium, Ion 1.13 (*)    All other components within normal limits  RESP PANEL BY RT-PCR (FLU A&B, COVID) ARPGX2  BRAIN NATRIURETIC PEPTIDE  BLOOD GAS, VENOUS  HEPATIC FUNCTION PANEL  LACTIC ACID, PLASMA  LACTIC ACID, PLASMA  PROCALCITONIN  TROPONIN I (HIGH SENSITIVITY)  TROPONIN I (HIGH SENSITIVITY)    EKG EKG Interpretation  Date/Time:  Tuesday March 04 2021 10:10:03 EDT Ventricular Rate:  104 PR Interval:  158 QRS Duration: 72 QT Interval:  328 QTC Calculation: 431 R Axis:   74 Text Interpretation: sinus rythm wandering baseline and artifact normal . no old comparison Confirmed by Charlesetta Shanks [phone]) on 03/04/2021 1:09:38 PM   Radiology DG Chest 2 View  Result Date: 03/04/2021 CLINICAL DATA:  Worsening shortness of breath since Friday, cough, history COPD EXAM: CHEST - 2 VIEW COMPARISON:  01/12/2014 FINDINGS: Normal heart size, mediastinal contours, and pulmonary vascularity. Mildly tortuous thoracic  aorta. Emphysematous and bronchitic changes consistent with COPD. Minimal diffuse interstitial changes in the mid to lower lungs with bibasilar subsegmental atelectasis. No acute infiltrate, pleural effusion, or pneumothorax. Bones demineralized. IMPRESSION: COPD changes with interstitial prominence at lung bases. No acute abnormalities. Electronically Signed   By: Lavonia Dana M.D.   On: 03/04/2021 10:57    Procedures Procedures   Medications Ordered in ED Medications  acetaminophen (TYLENOL) tablet 1,000 mg (0 mg Oral Hold 03/04/21 1248)  ipratropium (ATROVENT) nebulizer solution 1 mg (has no administration in time range)  cefTRIAXone (ROCEPHIN) 1 g in sodium chloride 0.9 % 100 mL IVPB (1 g Intravenous New Bag/Given 03/04/21 1347)  azithromycin (ZITHROMAX) 500 mg in sodium chloride 0.9 % 250 mL IVPB (has no administration in time range)  ipratropium-albuterol (DUONEB) 0.5-2.5 (3) MG/3ML nebulizer solution 3 mL (3 mLs Nebulization Given 03/04/21 1247)  methylPREDNISolone sodium succinate (SOLU-MEDROL) 125 mg/2 mL injection 125 mg (125 mg Intravenous Given 03/04/21 1247)  albuterol (PROVENTIL,VENTOLIN) solution continuous neb (10 mg/hr Nebulization Given 03/04/21 1349)    ED Course  I have reviewed the triage vital signs and the nursing notes.  Pertinent labs & imaging results that were available during my care of the patient were reviewed by me and considered in my medical decision making (see chart for details).  Clinical Course as of 03/04/21 1624  Tue Mar 04, 2021  1314 Patient lungs still have diffuse wheezing. He was 94 on 4 L when I was in the room. Still tachypnic  [HS]  9371 DG Chest 2 View No PNA, PTX, or signs of acute pathology. Signs consistent with longstanding COPD.  [HS]  6967 Patient complained of difficulty breahting after urinating. He is on CPAP. 97% O2. Observed, and appears to be feeling better. Ordering another neb and mg [HS]    Clinical Course User Index [HS] Sherrill Raring,  PA-C   MDM Rules/Calculators/A&P                          Patient is a 70 year old male with history of COPD for cough and shortness of breath.  On physical exam he has diffuse wheezing throughout all lung fields.  He is able to speak in complete sentences but is obviously tachypneic.  At home he is on no oxygen today in the ED he has been put on 4 L of oxygen via nasal cannula.  On 3 L his oxygen was down to 90.  I started him on DuoNeb, Solu-Medrol.  We will continue to reassess.  Is most likely a COPD exacerbation.  I have considered other causes such as PE, MI, pneumonia.   Per chart review it appears patient underwent a cardiac catheterization on 03/08/2020.  There is mild occlusion to the RCA, but no stents were placed.  Ejection fracture was measured a month later to be 60 to 65%.  .Given patient's hypoxia, work of breathing,  I believe that he will likely need admission. Dr. Vallery Ridge also assessed the patient and there is some consideration that he might be a candidate for BiPAP.  I put in a consultation for the hospitalist, instructions pending.  I spoke with Dr. Waldron Labs. He is in agreement with admitting the patient.  Discussed HPI, physical exam and plan of care for this patient with attending Charlesetta Shanks. The attending physician evaluated this patient as part of a shared visit and agrees with plan of care.  Final Clinical Impression(s) / ED Diagnoses Final diagnoses:  None    Rx / DC Orders ED Discharge Orders    None       Sherrill Raring, [place] 03/04/21 1354    Charlesetta Shanks, MD 03/11/21 2120

## 2021-03-04 NOTE — H&P (Signed)
TRH H&P   Patient Demographics:    Mark Tate, is a 70 y.o. male  MRN: [id number]   DOB - 06-05-1951  Admit Date - 03/04/2021  Outpatient Primary MD for the patient is Alroy Dust, L.Marlou Sa, MD  Referring MD/NP/PA: PA Sage  Patient coming from: Home  Chief Complaint  Patient presents with  . Shortness of Breath      HPI:    Mark Tate  is a 70 y.o. male, with past medical history of COPD, tobacco abuse, CAD, presents to ED secondary to complaints of shortness of breath, reports symptoms started 5 days ago, he spent the weekend with a family member who had recently gotten COVID-19, does report dyspnea with activity, currently even with rest as well, progressive, he does report some fever, cough, with a productive yellow phlegm, he reports COPD medicine has not been helping him which prompted him to come to ED, he denies any chest pain, coffee-ground emesis, abdominal pain, nausea or vomiting -In ED patient was noted to be 85% on room air on presentation, then he was started on 4 L nasal cannula with increased work of breathing, so he was started on BiPAP, he was febrile to 101.2, this x-ray significant for COPD, not pneumonia, white blood cell count at 11.2, [facility] consulted to admit    Review of systems:    In addition to the HPI above,  Does report fever, chills, and fatigue No Headache, No changes with Vision or hearing, No problems swallowing food or Liquids, No Chest pain, reports cough and shortness of breath No Abdominal pain, No Nausea or Vommitting, Bowel movements are regular, No Blood in stool or Urine, No dysuria, No new skin rashes or bruises, No new joints pains-aches,  No new weakness, tingling, numbness in any extremity, No recent weight gain or loss, No polyuria, polydypsia or polyphagia, No significant Mental Stressors.  A full 10 point Review of  Systems was done, except as stated above, all other Review of Systems were negative.   With Past History of the following :    Past Medical History:  Diagnosis Date  . Allergies   . Asthma   . CAD in native artery 01/03/2020  . COPD (chronic obstructive pulmonary disease) ([place])   . COPD (chronic obstructive pulmonary disease) ([place]) 01/03/2020  . Coughing   . Fatigue   . Neck swelling   . Skin cancer   . SOB (shortness of breath) on exertion   . Tobacco use 01/03/2020  . Wheezing       Past Surgical History:  Procedure Laterality Date  . BASAL CELL CARCINOMA EXCISION     NECK  . CATARACT EXTRACTION    . MOHS SURGERY  2013, 2019   ON SCALP AND LEFT ARM   . RIGHT/LEFT HEART CATH AND CORONARY ANGIOGRAPHY N/A 03/08/2020   Procedure: RIGHT/LEFT HEART CATH AND CORONARY ANGIOGRAPHY;  Surgeon:  Burnell Blanks, MD;  Location: [facility];  Service: Cardiovascular;  Laterality: N/A;      Social History:     Social History   Tobacco Use  . Smoking status: Current Every Day Smoker    Packs/day: 1.00    Years: 45.00    Pack years: 45.00    Types: Cigarettes  . Smokeless tobacco: Never Used  Substance Use Topics  . Alcohol use: Not on file       Family History :     Family History  Problem Relation Age of Onset  . Cancer Mother        BONE, LUNG  . Cancer Father        COLON, PROSTATE  . Cancer - Lung Maternal Aunt       Home Medications:   Prior to Admission medications   Medication Sig Start Date End Date Taking? Authorizing Provider  acetaminophen (TYLENOL) 500 MG tablet Take 500 mg by mouth every 6 (six) hours as needed for headache.     [provider]  albuterol (VENTOLIN HFA) 108 (90 Base) MCG/ACT inhaler Inhale 1-2 puffs into the lungs every 6 (six) hours as needed for wheezing or shortness of breath.     [provider]  Ascorbic Acid (VITAMIN C) 1000 MG tablet Take 1,000 mg by mouth daily.    [provider]  aspirin  EC 81 MG tablet Take 1 tablet (81 mg total) by mouth daily. 01/04/20   [name], NP  atorvastatin (LIPITOR) 40 MG tablet TAKE 1 TABLET BY MOUTH EVERY DAY 12/16/20   [name], MD  carvedilol (COREG) 6.25 MG tablet TAKE 0.5 TABLETS (3.125 MG TOTAL) BY MOUTH 2 (TWO) TIMES DAILY WITH A MEAL. 02/19/21   [name], MD  cetirizine (ZYRTEC) 10 MG tablet Take 10 mg by mouth daily.    [provider]  cholecalciferol (VITAMIN D3) 25 MCG (1000 UNIT) tablet Take 1,000 Units by mouth daily.    [provider]  ENTRESTO 97-103 MG TAKE 1 TABLET BY MOUTH 2 TIMES DAILY. 02/26/21   [name], MD  Multiple Vitamins-Minerals (CENTRUM SILVER 50+MEN) TABS Take 1 tablet by mouth daily.    [provider]  Omega-3 Fatty Acids (FISH OIL) 1000 MG CAPS Take 1,000 mg by mouth daily.     [provider]  SYMBICORT 160-4.5 MCG/ACT inhaler Inhale 2 puffs into the lungs in the morning and at bedtime.  02/22/18   [provider]     Allergies:    No Known Allergies   Physical Exam:   Vitals  Blood pressure 114/78, pulse 89, temperature 98.6 F (37 C), temperature source Oral, resp. rate (!) 21, SpO2 97 %.   1. General developed male, in mild respiratory distress, on BiPAP  2. Normal affect and insight, Not Suicidal or Homicidal, Awake Alert, Oriented X 3.  3. No F.N deficits, ALL C.Nerves Intact, Strength 5/5 all 4 extremities, Sensation intact all 4 extremities, Plantars down going.  4. Ears and Eyes appear Normal, Conjunctivae clear, PERRLA. Moist Oral Mucosa.  5. Supple Neck, No JVD, No cervical lymphadenopathy appriciated, No Carotid Bruits.  6. Symmetrical Chest wall movement, he is with diffuse wheezing, diminished air entry bilaterally, with some increased work of breathing  7. RRR, No Gallops, Rubs or Murmurs, No Parasternal Heave.  8. Positive Bowel Sounds, Abdomen Soft, No tenderness, No organomegaly appriciated,No rebound -guarding or  rigidity.  9.  No Cyanosis, Normal  Skin Turgor, No Skin Rash or Bruise.  10. Good muscle tone,  joints appear normal , no effusions, Normal ROM.  11. No Palpable Lymph Nodes in Neck or Axillae     Data Review:    CBC Recent Labs  Lab 03/04/21 1025 03/04/21 1320  WBC 11.2*  --   HGB 16.1 15.0  HCT 47.5 44.0  PLT 186  --   MCV 93.5  --   MCH 31.7  --   MCHC 33.9  --   RDW 13.2  --   LYMPHSABS 1.8  --   MONOABS 1.2*  --   EOSABS 0.1  --   BASOSABS 0.0  --    ------------------------------------------------------------------------------------------------------------------  Chemistries  Recent Labs  Lab 03/04/21 1025 03/04/21 1320  NA 133* 135  K 4.3 4.5  CL 101  --   CO2 22  --   GLUCOSE 117*  --   BUN 19  --   CREATININE 1.12  --   CALCIUM 9.1  --    ------------------------------------------------------------------------------------------------------------------ CrCl cannot be calculated (Unknown ideal weight.). ------------------------------------------------------------------------------------------------------------------ No results for input(s): TSH, T4TOTAL, T3FREE, THYROIDAB in the last 72 hours.  Invalid input(s): FREET3  Coagulation profile No results for input(s): INR, PROTIME in the last 168 hours. ------------------------------------------------------------------------------------------------------------------- No results for input(s): DDIMER in the last 72 hours. -------------------------------------------------------------------------------------------------------------------  Cardiac Enzymes No results for input(s): CKMB, TROPONINI, MYOGLOBIN in the last 168 hours.  Invalid input(s): CK ------------------------------------------------------------------------------------------------------------------ No results found for:  BNP   ---------------------------------------------------------------------------------------------------------------  Urinalysis No results found for: COLORURINE, APPEARANCEUR, LABSPEC, PHURINE, GLUCOSEU, HGBUR, BILIRUBINUR, [place], PROTEINUR, UROBILINOGEN, NITRITE, LEUKOCYTESUR  ----------------------------------------------------------------------------------------------------------------   Imaging Results:    DG Chest 2 View  Result Date: 03/04/2021 CLINICAL DATA:  Worsening shortness of breath since Friday, cough, history COPD EXAM: CHEST - 2 VIEW COMPARISON:  01/12/2014 FINDINGS: Normal heart size, mediastinal contours, and pulmonary vascularity. Mildly tortuous thoracic aorta. Emphysematous and bronchitic changes consistent with COPD. Minimal diffuse interstitial changes in the mid to lower lungs with bibasilar subsegmental atelectasis. No acute infiltrate, pleural effusion, or pneumothorax. Bones demineralized. IMPRESSION: COPD changes with interstitial prominence at lung bases. No acute abnormalities. Electronically Signed   By: Lavonia Dana M.D.   On: 03/04/2021 10:57    My personal review of EKG: Sinus tachycardia at 104, QTC of 431   Assessment & Plan:    Active Problems:   CAD in native artery   Tobacco use   COPD exacerbation (HCC)  Acute hypoxic/hypercarbic respiratory failure/COPD exacerbation -This is due to COPD exacerbation, initially 85% on room air, he is currently on BiPAP, ABG showing PCO2 of 64. -Continue with BiPAP for next couple hours, then transition back to oxygen with target oxygen 93 to 95% -Even though chest x-ray does not show up pneumonia, but given fever 101.2, and significant productive phlegm, likely underlining for pneumonia, will repeat chest x-ray in 24 to 48 hours -Continue with IV Solu-Medrol, scheduled duo nebs, Dulera, Pulmicort, he was encouraged with incentive spirometer, flutter valve, was started on Mucinex as well  Fever -We will  check urine as well, follow-up blood cultures, repeat chest x-ray in 1 to 2 days  Tobacco abuse -He was counseled, started on nicotine patch  History of CAD/myopathy -Continue with aspirin, statin, beta-blockers and Entresto -Initially with EF 30 to 35%, most recent echo 7/21 with EF 60 to 65%  Hyperlipidemia -Continue with statin and fish oil  DVT Prophylaxis   Lovenox   AM Labs Ordered, also please review Full Orders  Family Communication: Admission, patients condition and plan of care including tests being ordered have been discussed with the patient and wife at bedside who indicate understanding and agree with the plan and Code Status.  Code Status Full  Likely DC to  Home  Condition GUARDED    Consults called: none    Admission status: inpatient    Time spent in minutes : 65 minutes   Phillips Climes M.D on 03/04/2021 at 2:54 PM   [facility] - Office  [phone]

## 2021-03-04 NOTE — ED Provider Notes (Signed)
Emergency Medicine Provider Triage Evaluation Note  Mark Tate , a 70 y.o. male  was evaluated in triage.  Pt complains of SOB x 5 days. H/O COPD, no missed doses of medicine. Spend weekend with family members who just got over [place]. He is vaccinated. On oxygen during MSE but not on O2 at home.   Review of Systems  Positive: SOB, cough Negative: Chest pain, fever   Physical Exam  BP (!) 148/106 (BP Location: Right Arm)   Pulse (!) 106   Temp (!) 101.3 F (38.5 C) (Oral)   Resp (!) 32   SpO2 93%  Gen:   Awake, no distress   Resp:  Increased effort. Diffuse wheezing in all lung fields.  Coughing on exam. MSK:   Moves extremities without difficulty  Other:  S1 S2  Medical Decision Making  Medically screening exam initiated at 10:15 AM.  Appropriate orders placed.  [name] was informed that the remainder of the evaluation will be completed by another provider, this initial triage assessment does not replace that evaluation, and the importance of remaining in the ED until their evaluation is complete.  Orders placed.    Sherrill Raring, PA-C 03/04/21 [place], MD 03/11/21 2115

## 2021-03-04 NOTE — ED Notes (Signed)
Called RT @ [phone]

## 2021-03-04 NOTE — ED Notes (Signed)
Attempted to call report, secretary advised has not been approved yet and will call back. Call back number provided.

## 2021-03-04 NOTE — Progress Notes (Signed)
RT removed pt from BiPAP and placed pt on 2lNC. Pt tolerated well with SVS. RT will continue to monitor.

## 2021-03-04 NOTE — ED Notes (Signed)
NEXT [facility]

## 2021-03-04 NOTE — ED Triage Notes (Signed)
Pt with hx of COPD, here for eval of worsening shob since Friday after being around his family who is sick. Febrile in triage.

## 2021-03-04 NOTE — ED Notes (Signed)
Called RT @ 20:07.

## 2021-03-04 NOTE — Progress Notes (Signed)
   03/04/21 2138  Assess: MEWS Score  Temp 98.7 F (37.1 C)  BP (!) 136/95  Pulse Rate (!) 114  ECG Heart Rate (!) 110  Resp (!) 21  SpO2 96 %  Assess: MEWS Score  MEWS Temp 0  MEWS Systolic 0  MEWS Pulse 1  MEWS RR 1  MEWS LOC 0  MEWS Score 2  MEWS Score Color Yellow  Assess: if the MEWS score is Yellow or Red  Were vital signs taken at a resting state? Yes  Focused Assessment No change from prior assessment  Early Detection of Sepsis Score *See Row Information* High  MEWS guidelines implemented *See Row Information* No, previously yellow, continue vital signs every 4 hours  Treat  MEWS Interventions Escalated (See documentation below)

## 2021-03-05 ENCOUNTER — Encounter (HOSPITAL_COMMUNITY): Payer: Self-pay | Admitting: Internal Medicine

## 2021-03-05 DIAGNOSIS — E785 Hyperlipidemia, unspecified: Secondary | ICD-10-CM

## 2021-03-05 DIAGNOSIS — J9601 Acute respiratory failure with hypoxia: Secondary | ICD-10-CM

## 2021-03-05 DIAGNOSIS — I5032 Chronic diastolic (congestive) heart failure: Secondary | ICD-10-CM

## 2021-03-05 LAB — BASIC METABOLIC PANEL
Anion gap: 8 (ref 5–15)
BUN: 26 mg/dL — ABNORMAL HIGH (ref 8–23)
CO2: 25 mmol/L (ref 22–32)
Calcium: 8.6 mg/dL — ABNORMAL LOW (ref 8.9–10.3)
Chloride: 99 mmol/L (ref 98–111)
Creatinine, Ser: 1.2 mg/dL (ref 0.61–1.24)
GFR, Estimated: >60 mL/min (ref >60)
Glucose, Bld: 161 mg/dL — ABNORMAL HIGH (ref 70–99)
Potassium: 4.1 mmol/L (ref 3.5–5.1)
Sodium: 132 mmol/L — ABNORMAL LOW (ref 135–145)

## 2021-03-05 LAB — CBC
HCT: 44.8 % (ref 39.0–52.0)
Hemoglobin: 15.6 g/dL (ref 13.0–17.0)
MCH: 32 pg (ref 26.0–34.0)
MCHC: 34.8 g/dL (ref 30.0–36.0)
MCV: 92 fL (ref 80.0–100.0)
Platelets: 168 10*3/uL (ref 150–400)
RBC: 4.87 MIL/uL (ref 4.22–5.81)
RDW: 13.4 % (ref 11.5–15.5)
WBC: 11.5 10*3/uL — ABNORMAL HIGH (ref 4.0–10.5)
nRBC: 0 % (ref 0.0–0.2)

## 2021-03-05 LAB — MRSA PCR SCREENING: MRSA by PCR: NEGATIVE

## 2021-03-05 LAB — PROCALCITONIN: Procalcitonin: <0.1 ng/mL

## 2021-03-05 MED ORDER — ORAL CARE MOUTH RINSE
15.0000 mL | Freq: Two times a day (BID) | OROMUCOSAL | Status: DC
  Administered 2021-03-05 – 2021-03-12 (×9): 15 mL via OROMUCOSAL

## 2021-03-05 MED ORDER — FUROSEMIDE 10 MG/ML IJ SOLN
40.0000 mg | Freq: Once | INTRAMUSCULAR | Status: AC
  Administered 2021-03-05: 40 mg via INTRAVENOUS
  Filled 2021-03-05: qty 4

## 2021-03-05 MED ORDER — ALBUTEROL SULFATE (2.5 MG/3ML) 0.083% IN NEBU
2.5000 mg | INHALATION_SOLUTION | RESPIRATORY_TRACT | Status: DC | PRN
  Administered 2021-03-05 (×2): 2.5 mg via RESPIRATORY_TRACT
  Filled 2021-03-05 (×2): qty 3

## 2021-03-05 MED ORDER — ACETAMINOPHEN 325 MG PO TABS
650.0000 mg | ORAL_TABLET | Freq: Four times a day (QID) | ORAL | Status: DC | PRN
  Administered 2021-03-11: 650 mg via ORAL
  Filled 2021-03-05: qty 2

## 2021-03-05 MED ORDER — METHYLPREDNISOLONE SODIUM SUCC 125 MG IJ SOLR
60.0000 mg | Freq: Three times a day (TID) | INTRAMUSCULAR | Status: DC
  Administered 2021-03-05 – 2021-03-06 (×4): 60 mg via INTRAVENOUS
  Filled 2021-03-05 (×4): qty 2

## 2021-03-05 MED ORDER — FLUTICASONE PROPIONATE 50 MCG/ACT NA SUSP
2.0000 | Freq: Every day | NASAL | Status: DC
  Administered 2021-03-05 – 2021-03-13 (×8): 2 via NASAL
  Filled 2021-03-05: qty 16

## 2021-03-05 MED ORDER — BENZONATATE 100 MG PO CAPS
200.0000 mg | ORAL_CAPSULE | Freq: Three times a day (TID) | ORAL | Status: DC | PRN
  Administered 2021-03-05 – 2021-03-11 (×4): 200 mg via ORAL
  Filled 2021-03-05 (×5): qty 2

## 2021-03-05 MED ORDER — LORATADINE 10 MG PO TABS
10.0000 mg | ORAL_TABLET | Freq: Every day | ORAL | Status: DC
  Administered 2021-03-05 – 2021-03-13 (×9): 10 mg via ORAL
  Filled 2021-03-05 (×9): qty 1

## 2021-03-05 MED ORDER — IPRATROPIUM-ALBUTEROL 0.5-2.5 (3) MG/3ML IN SOLN
3.0000 mL | RESPIRATORY_TRACT | Status: DC
  Administered 2021-03-05 (×4): 3 mL via RESPIRATORY_TRACT
  Filled 2021-03-05 (×4): qty 3

## 2021-03-05 MED ORDER — GUAIFENESIN ER 600 MG PO TB12: 600.0000 mg | ORAL_TABLET | Freq: Two times a day (BID) | ORAL | Status: DC

## 2021-03-05 MED ORDER — HYDROCOD POLST-CPM POLST ER 10-8 MG/5ML PO SUER
5.0000 mL | Freq: Two times a day (BID) | ORAL | Status: DC | PRN
  Administered 2021-03-05: 5 mL via ORAL
  Filled 2021-03-05: qty 5

## 2021-03-05 MED ORDER — CHLORHEXIDINE GLUCONATE 0.12 % MT SOLN
15.0000 mL | Freq: Two times a day (BID) | OROMUCOSAL | Status: DC
  Administered 2021-03-05 – 2021-03-13 (×16): 15 mL via OROMUCOSAL
  Filled 2021-03-05 (×15): qty 15

## 2021-03-05 NOTE — Progress Notes (Signed)
PROGRESS NOTE        PATIENT DETAILS Name: Mark Tate Age: 70 y.o. Sex: male Date of Birth: [birth year] Admit Date: 03/04/2021 Admitting Physician Albertine Patricia, MD ZOX:[id number], L.Marlou Sa, MD  Brief Narrative: Patient is a 70 y.o. male with history of COPD, chronic systolic heart failure (EF improved on last echo) presented with 3-4-day history of cough/worsening shortness of breath-found to have acute hypoxic respiratory failure requiring BiPAP support due to COPD exacerbation.  Significant events: 6/7>> admit for COPD exacerbation  Significant studies: 01/01/2020>> Echo: EF 30-35% 04/22/2020>> Echo: EF 60-65% 03/04/2021>> CXR: No obvious pneumonia  Antimicrobial therapy: Rocephin: 6/7>> Zithromax: 6/7>>  Microbiology data: 6/7>> COVID PCR: Negative  Procedures : None  Consults: None  DVT Prophylaxis : enoxaparin (LOVENOX) injection 40 mg Start: 03/04/21 2200   Subjective: Liberated off BiPAP earlier this morning-still with significant amount of wheezing.  On 4 L of oxygen.  Appears comfortable.   Assessment/Plan: Acute hypoxic respiratory failure due to COPD exacerbation: Liberated off BiPAP this morning-on 4 L of oxygen-still with diffuse expiratory rhonchi all over-continue IV steroids/bronchodilators/empiric IV antibiotics.  Will give 1 dose of IV Lasix although he does not have any evidence of volume overload-will try to keep in negative balance.  Keep BiPAP at bedside-and use as needed.  History of chronic combined systolic/diastolic heart failure: Most recent echo with improved EF-no evidence of volume overload-but giving 1 dose of Lasix-see above.  History of nonobstructive CAD: No anginal symptoms-continue aspirin/statin/Entresto-given diffuse wheezing-hold Coreg for a few days.  Diet: Diet Order            Diet Heart Room service appropriate? Yes; Fluid consistency: Thin  Diet effective now                  Code  Status: Full code   Family Communication: Spouse at bedside  Disposition Plan: Status is: Inpatient  Remains inpatient appropriate because:Inpatient level of care appropriate due to severity of illness   Dispo: The patient is from: Home              Anticipated d/c is to: Home              Patient currently is not medically stable to d/c.   Difficult to place patient No    Barriers to Discharge: COPD exacerbation-on 4 L of oxygen this morning-not yet at baseline-not yet stable for discharge.  Antimicrobial agents: Anti-infectives (From admission, onward)   Start     Dose/Rate Route Frequency Ordered Stop   03/05/21 1200  cefTRIAXone (ROCEPHIN) 1 g in sodium chloride 0.9 % 100 mL IVPB        1 g 200 mL/hr over 30 Minutes Intravenous Every 24 hours 03/04/21 2040 03/10/21 1159   03/05/21 1200  azithromycin (ZITHROMAX) 500 mg in sodium chloride 0.9 % 250 mL IVPB        500 mg 250 mL/hr over 60 Minutes Intravenous Every 24 hours 03/04/21 1510     03/04/21 1330  cefTRIAXone (ROCEPHIN) 1 g in sodium chloride 0.9 % 100 mL IVPB        1 g 200 mL/hr over 30 Minutes Intravenous  Once 03/04/21 1324 03/04/21 1437   03/04/21 1330  azithromycin (ZITHROMAX) 500 mg in sodium chloride 0.9 % 250 mL IVPB        500 mg 250 mL/hr  over 60 Minutes Intravenous  Once 03/04/21 1324 03/04/21 1603       Time spent: 35 minutes-Greater than 50% of this time was spent in counseling, explanation of diagnosis, planning of further management, and coordination of care.  MEDICATIONS: Scheduled Meds: . aspirin EC  81 mg Oral Daily  . atorvastatin  40 mg Oral Daily  . budesonide (PULMICORT) nebulizer solution  0.5 mg Nebulization BID  . chlorhexidine  15 mL Mouth Rinse BID  . enoxaparin (LOVENOX) injection  40 mg Subcutaneous Q24H  . fluticasone  2 spray Each Nare Daily  . guaiFENesin  1,200 mg Oral BID  . ipratropium-albuterol  3 mL Nebulization Q4H  . loratadine  10 mg Oral Daily  . mouth rinse  15  mL Mouth Rinse q12n4p  . methylPREDNISolone (SOLU-MEDROL) injection  60 mg Intravenous Q8H  . nicotine  21 mg Transdermal Daily  . omega-3 acid ethyl esters  1 g Oral BID  . sacubitril-valsartan  1 tablet Oral BID   Continuous Infusions: . azithromycin 500 mg (03/05/21 1120)  . cefTRIAXone (ROCEPHIN)  IV 1 g (03/05/21 1236)   PRN Meds:.acetaminophen, albuterol, benzonatate, chlorpheniramine-HYDROcodone   PHYSICAL EXAM: Vital signs: Vitals:   03/05/21 0900 03/05/21 0902 03/05/21 1149 03/05/21 1200  BP:  111/77 101/74   Pulse:  95 100   Resp:  (!) 28 (!) 30   Temp: 98.1 F (36.7 C)   98 F (36.7 C)  TempSrc: Axillary   Oral  SpO2:  92% 100% 100%  Weight:      Height:       Filed Weights   03/04/21 1758  Weight: 81.6 kg   Body mass index is 25.1 kg/m.   Gen Exam:Alert awake-not in any distress HEENT:atraumatic, normocephalic Chest: Diffuse wheezing all over-but moving air. CVS:S1S2 regular Abdomen:soft non tender, non distended Extremities:no edema Neurology: Non focal Skin: no rash  I have personally reviewed following labs and imaging studies  LABORATORY DATA: CBC: Recent Labs  Lab 03/04/21 1025 03/04/21 1320 03/05/21 0050  WBC 11.2*  --  11.5*  NEUTROABS 8.0*  --   --   HGB 16.1 15.0 15.6  HCT 47.5 44.0 44.8  MCV 93.5  --  92.0  PLT 186  --  622    Basic Metabolic Panel: Recent Labs  Lab 03/04/21 1025 03/04/21 1320 03/05/21 0050  NA 133* 135 132*  K 4.3 4.5 4.1  CL 101  --  99  CO2 22  --  25  GLUCOSE 117*  --  161*  BUN 19  --  26*  CREATININE 1.12  --  1.20  CALCIUM 9.1  --  8.6*    GFR: Estimated Creatinine Clearance: 59.3 mL/min (by C-G formula based on SCr of 1.2 mg/dL).  Liver Function Tests: Recent Labs  Lab 03/04/21 1323  AST 45*  ALT 21  ALKPHOS 62  BILITOT 1.1  PROT 6.9  ALBUMIN 3.6   No results for input(s): LIPASE, AMYLASE in the last 168 hours. No results for input(s): AMMONIA in the last 168  hours.  Coagulation Profile: No results for input(s): INR, PROTIME in the last 168 hours.  Cardiac Enzymes: No results for input(s): CKTOTAL, CKMB, CKMBINDEX, TROPONINI in the last 168 hours.  BNP (last 3 results) No results for input(s): PROBNP in the last 8760 hours.  Lipid Profile: No results for input(s): CHOL, HDL, LDLCALC, TRIG, CHOLHDL, LDLDIRECT in the last 72 hours.  Thyroid Function Tests: No results for input(s): TSH, T4TOTAL, FREET4, T3FREE,  THYROIDAB in the last 72 hours.  Anemia Panel: No results for input(s): VITAMINB12, FOLATE, FERRITIN, TIBC, IRON, RETICCTPCT in the last 72 hours.  Urine analysis:    Component Value Date/Time   COLORURINE AMBER (A) 03/04/2021 1626   APPEARANCEUR HAZY (A) 03/04/2021 1626   LABSPEC 1.024 03/04/2021 1626   PHURINE 5.0 03/04/2021 1626   GLUCOSEU NEGATIVE 03/04/2021 1626   HGBUR NEGATIVE 03/04/2021 1626   BILIRUBINUR NEGATIVE 03/04/2021 1626   KETONESUR 5 (A) 03/04/2021 1626   PROTEINUR 30 (A) 03/04/2021 1626   NITRITE NEGATIVE 03/04/2021 1626   LEUKOCYTESUR NEGATIVE 03/04/2021 1626    Sepsis Labs: Lactic Acid, Venous    Component Value Date/Time   LATICACIDVEN 1.9 03/04/2021 1626    MICROBIOLOGY: Recent Results (from the past 240 hour(s))  Resp Panel by RT-PCR (Flu A&B, Covid) Nasopharyngeal Swab     Status: None   Collection Time: 03/04/21 10:13 AM   Specimen: Nasopharyngeal Swab; Nasopharyngeal(NP) swabs in vial transport medium  Result Value Ref Range Status   SARS Coronavirus 2 by RT PCR NEGATIVE NEGATIVE Final    Comment: (NOTE) SARS-CoV-2 target nucleic acids are NOT DETECTED.  The SARS-CoV-2 RNA is generally detectable in upper respiratory specimens during the acute phase of infection. The lowest concentration of SARS-CoV-2 viral copies this assay can detect is 138 copies/mL. A negative result does not preclude SARS-Cov-2 infection and should not be used as the sole basis for treatment or other patient  management decisions. A negative result may occur with  improper specimen collection/handling, submission of specimen other than nasopharyngeal swab, presence of viral mutation(s) within the areas targeted by this assay, and inadequate number of viral copies(<138 copies/mL). A negative result must be combined with clinical observations, patient history, and epidemiological information. The expected result is Negative.  Fact Sheet for Patients:  EntrepreneurPulse.com.au  Fact Sheet for Healthcare Providers:  IncredibleEmployment.be  This test is no t yet approved or cleared by the [place] FDA and  has been authorized for detection and/or diagnosis of SARS-CoV-2 by FDA under an Emergency Use Authorization (EUA). This EUA will remain  in effect (meaning this test can be used) for the duration of the COVID-19 declaration under Section 564(b)(1) of the Act, 21 U.S.C.section 360bbb-3(b)(1), unless the authorization is terminated  or revoked sooner.       Influenza A by PCR NEGATIVE NEGATIVE Final   Influenza B by PCR NEGATIVE NEGATIVE Final    Comment: (NOTE) The Xpert Xpress SARS-CoV-2/FLU/RSV plus assay is intended as an aid in the diagnosis of influenza from Nasopharyngeal swab specimens and should not be used as a sole basis for treatment. Nasal washings and aspirates are unacceptable for Xpert Xpress SARS-CoV-2/FLU/RSV testing.  Fact Sheet for Patients: EntrepreneurPulse.com.au  Fact Sheet for Healthcare Providers: IncredibleEmployment.be  This test is not yet approved or cleared by the [place] FDA and has been authorized for detection and/or diagnosis of SARS-CoV-2 by FDA under an Emergency Use Authorization (EUA). This EUA will remain in effect (meaning this test can be used) for the duration of the COVID-19 declaration under Section 564(b)(1) of the Act, 21 U.S.C. section 360bbb-3(b)(1),  unless the authorization is terminated or revoked.  Performed at [facility], [place] [address] [place], [place] [address]   MRSA PCR Screening     Status: None   Collection Time: 03/05/21  6:28 AM   Specimen: Nasal Mucosa; Nasopharyngeal  Result Value Ref Range Status   MRSA by PCR NEGATIVE NEGATIVE Final  Comment:        The GeneXpert MRSA Assay (FDA approved for NASAL specimens only), is one component of a comprehensive MRSA colonization surveillance program. It is not intended to diagnose MRSA infection nor to guide or monitor treatment for MRSA infections. Performed at [facility], [place] [address], [place], [place] [address]     RADIOLOGY STUDIES/RESULTS: DG Chest 2 View  Result Date: 03/04/2021 CLINICAL DATA:  Worsening shortness of breath since Friday, cough, history COPD EXAM: CHEST - 2 VIEW COMPARISON:  01/12/2014 FINDINGS: Normal heart size, mediastinal contours, and pulmonary vascularity. Mildly tortuous thoracic aorta. Emphysematous and bronchitic changes consistent with COPD. Minimal diffuse interstitial changes in the mid to lower lungs with bibasilar subsegmental atelectasis. No acute infiltrate, pleural effusion, or pneumothorax. Bones demineralized. IMPRESSION: COPD changes with interstitial prominence at lung bases. No acute abnormalities. Electronically Signed   By: Lavonia Dana M.D.   On: 03/04/2021 10:57     LOS: 1 day   Oren Binet, MD  [facility]    To contact the attending provider between 7A-7P or the covering provider during after hours 7P-7A, please log into the web site [email or web address] and access using universal [place] password for that web site. If you do not have the password, please call the hospital operator.  03/05/2021, 3:21 PM

## 2021-03-06 LAB — CBC
HCT: 42 % (ref 39.0–52.0)
Hemoglobin: 14.3 g/dL (ref 13.0–17.0)
MCH: 31.4 pg (ref 26.0–34.0)
MCHC: 34 g/dL (ref 30.0–36.0)
MCV: 92.3 fL (ref 80.0–100.0)
Platelets: 182 10*3/uL (ref 150–400)
RBC: 4.55 MIL/uL (ref 4.22–5.81)
RDW: 13.3 % (ref 11.5–15.5)
WBC: 16.9 10*3/uL — ABNORMAL HIGH (ref 4.0–10.5)
nRBC: 0 % (ref 0.0–0.2)

## 2021-03-06 LAB — BASIC METABOLIC PANEL
Anion gap: 8 (ref 5–15)
BUN: 46 mg/dL — ABNORMAL HIGH (ref 8–23)
CO2: 24 mmol/L (ref 22–32)
Calcium: 8.5 mg/dL — ABNORMAL LOW (ref 8.9–10.3)
Chloride: 97 mmol/L — ABNORMAL LOW (ref 98–111)
Creatinine, Ser: 1.42 mg/dL — ABNORMAL HIGH (ref 0.61–1.24)
GFR, Estimated: 53 mL/min — ABNORMAL LOW (ref >60)
Glucose, Bld: 154 mg/dL — ABNORMAL HIGH (ref 70–99)
Potassium: 4.2 mmol/L (ref 3.5–5.1)
Sodium: 129 mmol/L — ABNORMAL LOW (ref 135–145)

## 2021-03-06 LAB — PROCALCITONIN: Procalcitonin: <0.1 ng/mL

## 2021-03-06 MED ORDER — BISACODYL 10 MG RE SUPP: 10.0000 mg | Freq: Every day | RECTAL | Status: DC | PRN

## 2021-03-06 MED ORDER — POLYETHYLENE GLYCOL 3350 17 G PO PACK
17.0000 g | PACK | Freq: Every day | ORAL | Status: DC
  Administered 2021-03-06 – 2021-03-09 (×2): 17 g via ORAL
  Filled 2021-03-06 (×5): qty 1

## 2021-03-06 MED ORDER — METHYLPREDNISOLONE SODIUM SUCC 40 MG IJ SOLR
40.0000 mg | Freq: Three times a day (TID) | INTRAMUSCULAR | Status: DC
  Administered 2021-03-06 – 2021-03-08 (×6): 40 mg via INTRAVENOUS
  Filled 2021-03-06 (×6): qty 1

## 2021-03-06 MED ORDER — SODIUM CHLORIDE 0.9 % IV SOLN: INTRAVENOUS | Status: AC

## 2021-03-06 MED ORDER — IPRATROPIUM-ALBUTEROL 0.5-2.5 (3) MG/3ML IN SOLN
3.0000 mL | Freq: Four times a day (QID) | RESPIRATORY_TRACT | Status: DC
  Administered 2021-03-06 – 2021-03-07 (×6): 3 mL via RESPIRATORY_TRACT
  Filled 2021-03-06 (×5): qty 3

## 2021-03-06 NOTE — Plan of Care (Signed)
  Problem: Health Behavior/Discharge Planning: Goal: Ability to manage health-related needs will improve Outcome: Progressing   Problem: Clinical Measurements: Goal: Ability to maintain clinical measurements within normal limits will improve Outcome: Progressing   Problem: Clinical Measurements: Goal: Will remain free from infection Outcome: Progressing   Problem: Clinical Measurements: Goal: Diagnostic test results will improve Outcome: Progressing   

## 2021-03-06 NOTE — Progress Notes (Signed)
RT placed patient on BIPAP. Patient was complained of SOB and congestion. Patient did appear to have increased WOB and SOB. Patient tolerating BIPAP at this time.

## 2021-03-06 NOTE — Progress Notes (Signed)
PROGRESS NOTE        PATIENT DETAILS Name: Mark Tate Age: 70 y.o. Sex: male Date of Birth: Jan 14, 1951 Admit Date: 03/04/2021 Admitting Physician Albertine Patricia, MD RCV:ELFYBOFB, L.Marlou Sa, MD  Brief Narrative: Patient is a 70 y.o. male with history of COPD, chronic systolic heart failure (EF improved on last echo) presented with 3-4-day history of cough/worsening shortness of breath-found to have acute hypoxic respiratory failure requiring BiPAP support due to COPD exacerbation.  Significant events: 6/7>> admit for COPD exacerbation-on BiPAP 6/8>> off BiPAP-transitioned to 5 L via nasal cannula  Significant studies: 01/01/2020>> Echo: EF 30-35% 04/22/2020>> Echo: EF 60-65% 03/04/2021>> CXR: No obvious pneumonia  Antimicrobial therapy: Rocephin: 6/7>> Zithromax: 6/7>>  Microbiology data: 6/7>> COVID PCR: Negative  Procedures : None  Consults: None  DVT Prophylaxis : enoxaparin (LOVENOX) injection 40 mg Start: 03/04/21 2200   Subjective: Feels better-Down to 2 L of oxygen-however still wheezing.   Assessment/Plan: Acute hypoxic respiratory failure due to COPD exacerbation: Required BiPAP on initial presentation-overall improved-and has been gradually titrated down to 2 L this morning.  Moving air but still with diffuse rhonchi all over.  Continue steroids but taper slightly-remains on scheduled bronchodilators and empiric IV antibiotics.  No evidence of volume overload-hold diuretics for now.    AKI: Mild-likely due to Lasix-hold further Lasix-gently hydrate for a few hours.  Hyponatremia: Likely due to diuretics-we will gently hydrate over few hours-recheck electrolytes tomorrow.  History of chronic combined systolic/diastolic heart failure: Most recent echo with improved EF-no evidence of volume overload-continue supportive care-no further doses of Lasix planned at this point.  History of nonobstructive CAD: No anginal symptoms-continue  aspirin/statin/Entresto-given diffuse wheezing-hold Coreg for a few days.  Tobacco abuse: Counseled-continue transdermal nicotine.  Diet: Diet Order             Diet Heart Room service appropriate? Yes; Fluid consistency: Thin  Diet effective now                    Code Status: Full code   Family Communication: Spouse at bedside  Disposition Plan: Status is: Inpatient  Remains inpatient appropriate because:Inpatient level of care appropriate due to severity of illness   Dispo: The patient is from: Home              Anticipated d/c is to: Home              Patient currently is not medically stable to d/c.   Difficult to place patient No    Barriers to Discharge: COPD exacerbation-still with coarse rhonchi all over-still on requiring oxygen supplementation-not yet at baseline and not stable for discharge.    Antimicrobial agents: Anti-infectives (From admission, onward)    Start     Dose/Rate Route Frequency Ordered Stop   03/05/21 1200  cefTRIAXone (ROCEPHIN) 1 g in sodium chloride 0.9 % 100 mL IVPB        1 g 200 mL/hr over 30 Minutes Intravenous Every 24 hours 03/04/21 2040 03/10/21 1159   03/05/21 1200  azithromycin (ZITHROMAX) 500 mg in sodium chloride 0.9 % 250 mL IVPB        500 mg 250 mL/hr over 60 Minutes Intravenous Every 24 hours 03/04/21 1510     03/04/21 1330  cefTRIAXone (ROCEPHIN) 1 g in sodium chloride 0.9 % 100 mL IVPB  1 g 200 mL/hr over 30 Minutes Intravenous  Once 03/04/21 1324 03/04/21 1437   03/04/21 1330  azithromycin (ZITHROMAX) 500 mg in sodium chloride 0.9 % 250 mL IVPB        500 mg 250 mL/hr over 60 Minutes Intravenous  Once 03/04/21 1324 03/04/21 1603        Time spent: 35 minutes-Greater than 50% of this time was spent in counseling, explanation of diagnosis, planning of further management, and coordination of care.  MEDICATIONS: Scheduled Meds:  aspirin EC  81 mg Oral Daily   atorvastatin  40 mg Oral Daily    budesonide (PULMICORT) nebulizer solution  0.5 mg Nebulization BID   chlorhexidine  15 mL Mouth Rinse BID   enoxaparin (LOVENOX) injection  40 mg Subcutaneous Q24H   fluticasone  2 spray Each Nare Daily   guaiFENesin  1,200 mg Oral BID   ipratropium-albuterol  3 mL Nebulization QID   loratadine  10 mg Oral Daily   mouth rinse  15 mL Mouth Rinse q12n4p   methylPREDNISolone (SOLU-MEDROL) injection  60 mg Intravenous Q8H   nicotine  21 mg Transdermal Daily   omega-3 acid ethyl esters  1 g Oral BID   sacubitril-valsartan  1 tablet Oral BID   Continuous Infusions:  azithromycin 250 mL/hr at 03/06/21 1300   cefTRIAXone (ROCEPHIN)  IV 1 g (03/06/21 1415)   PRN Meds:.acetaminophen, albuterol, benzonatate, chlorpheniramine-HYDROcodone   PHYSICAL EXAM: Vital signs: Vitals:   03/06/21 0739 03/06/21 0903 03/06/21 1145 03/06/21 1200  BP: (!) 127/96   109/72  Pulse: 95  80 83  Resp: 20  (!) 22 (!) 21  Temp: 97.7 F (36.5 C)   97.7 F (36.5 C)  TempSrc: Oral   Oral  SpO2: 98% 100% 96% 98%  Weight:      Height:       Filed Weights   03/04/21 1758  Weight: 81.6 kg   Body mass index is 25.1 kg/m.   Gen Exam:Alert awake-not in any distress HEENT:atraumatic, normocephalic Chest: Moving air-but coarse rhonchi. CVS:S1S2 regular Abdomen:soft non tender, non distended Extremities:no edema Neurology: Non focal Skin: no rash   I have personally reviewed following labs and imaging studies  LABORATORY DATA: CBC: Recent Labs  Lab 03/04/21 1025 03/04/21 1320 03/05/21 0050 03/06/21 0231  WBC 11.2*  --  11.5* 16.9*  NEUTROABS 8.0*  --   --   --   HGB 16.1 15.0 15.6 14.3  HCT 47.5 44.0 44.8 42.0  MCV 93.5  --  92.0 92.3  PLT 186  --  168 182     Basic Metabolic Panel: Recent Labs  Lab 03/04/21 1025 03/04/21 1320 03/05/21 0050 03/06/21 0231  NA 133* 135 132* 129*  K 4.3 4.5 4.1 4.2  CL 101  --  99 97*  CO2 22  --  25 24  GLUCOSE 117*  --  161* 154*  BUN 19  --  26* 46*   CREATININE 1.12  --  1.20 1.42*  CALCIUM 9.1  --  8.6* 8.5*     GFR: Estimated Creatinine Clearance: 50.1 mL/min (A) (by C-G formula based on SCr of 1.42 mg/dL (H)).  Liver Function Tests: Recent Labs  Lab 03/04/21 1323  AST 45*  ALT 21  ALKPHOS 62  BILITOT 1.1  PROT 6.9  ALBUMIN 3.6    No results for input(s): LIPASE, AMYLASE in the last 168 hours. No results for input(s): AMMONIA in the last 168 hours.  Coagulation Profile: No results for  input(s): INR, PROTIME in the last 168 hours.  Cardiac Enzymes: No results for input(s): CKTOTAL, CKMB, CKMBINDEX, TROPONINI in the last 168 hours.  BNP (last 3 results) No results for input(s): PROBNP in the last 8760 hours.  Lipid Profile: No results for input(s): CHOL, HDL, LDLCALC, TRIG, CHOLHDL, LDLDIRECT in the last 72 hours.  Thyroid Function Tests: No results for input(s): TSH, T4TOTAL, FREET4, T3FREE, THYROIDAB in the last 72 hours.  Anemia Panel: No results for input(s): VITAMINB12, FOLATE, FERRITIN, TIBC, IRON, RETICCTPCT in the last 72 hours.  Urine analysis:    Component Value Date/Time   COLORURINE AMBER (A) 03/04/2021 1626   APPEARANCEUR HAZY (A) 03/04/2021 1626   LABSPEC 1.024 03/04/2021 1626   PHURINE 5.0 03/04/2021 1626   GLUCOSEU NEGATIVE 03/04/2021 1626   HGBUR NEGATIVE 03/04/2021 1626   BILIRUBINUR NEGATIVE 03/04/2021 1626   KETONESUR 5 (A) 03/04/2021 1626   PROTEINUR 30 (A) 03/04/2021 1626   NITRITE NEGATIVE 03/04/2021 1626   LEUKOCYTESUR NEGATIVE 03/04/2021 1626    Sepsis Labs: Lactic Acid, Venous    Component Value Date/Time   LATICACIDVEN 1.9 03/04/2021 1626    MICROBIOLOGY: Recent Results (from the past 240 hour(s))  Resp Panel by RT-PCR (Flu A&B, Covid) Nasopharyngeal Swab     Status: None   Collection Time: 03/04/21 10:13 AM   Specimen: Nasopharyngeal Swab; Nasopharyngeal(NP) swabs in vial transport medium  Result Value Ref Range Status   SARS Coronavirus 2 by RT PCR NEGATIVE  NEGATIVE Final    Comment: (NOTE) SARS-CoV-2 target nucleic acids are NOT DETECTED.  The SARS-CoV-2 RNA is generally detectable in upper respiratory specimens during the acute phase of infection. The lowest concentration of SARS-CoV-2 viral copies this assay can detect is 138 copies/mL. A negative result does not preclude SARS-Cov-2 infection and should not be used as the sole basis for treatment or other patient management decisions. A negative result may occur with  improper specimen collection/handling, submission of specimen other than nasopharyngeal swab, presence of viral mutation(s) within the areas targeted by this assay, and inadequate number of viral copies(<138 copies/mL). A negative result must be combined with clinical observations, patient history, and epidemiological information. The expected result is Negative.  Fact Sheet for Patients:  EntrepreneurPulse.com.au  Fact Sheet for Healthcare Providers:  IncredibleEmployment.be  This test is no t yet approved or cleared by the [place] FDA and  has been authorized for detection and/or diagnosis of SARS-CoV-2 by FDA under an Emergency Use Authorization (EUA). This EUA will remain  in effect (meaning this test can be used) for the duration of the COVID-19 declaration under Section 564(b)(1) of the Act, 21 U.S.C.section 360bbb-3(b)(1), unless the authorization is terminated  or revoked sooner.       Influenza A by PCR NEGATIVE NEGATIVE Final   Influenza B by PCR NEGATIVE NEGATIVE Final    Comment: (NOTE) The Xpert Xpress SARS-CoV-2/FLU/RSV plus assay is intended as an aid in the diagnosis of influenza from Nasopharyngeal swab specimens and should not be used as a sole basis for treatment. Nasal washings and aspirates are unacceptable for Xpert Xpress SARS-CoV-2/FLU/RSV testing.  Fact Sheet for Patients: EntrepreneurPulse.com.au  Fact Sheet for Healthcare  Providers: IncredibleEmployment.be  This test is not yet approved or cleared by the [place] FDA and has been authorized for detection and/or diagnosis of SARS-CoV-2 by FDA under an Emergency Use Authorization (EUA). This EUA will remain in effect (meaning this test can be used) for the duration of the COVID-19 declaration under Section 564(b)(1)  of the Act, 21 U.S.C. section 360bbb-3(b)(1), unless the authorization is terminated or revoked.  Performed at [facility], [place] [address] [place], [place] [address]   MRSA PCR Screening     Status: None   Collection Time: 03/05/21  6:28 AM   Specimen: Nasal Mucosa; Nasopharyngeal  Result Value Ref Range Status   MRSA by PCR NEGATIVE NEGATIVE Final    Comment:        The GeneXpert MRSA Assay (FDA approved for NASAL specimens only), is one component of a comprehensive MRSA colonization surveillance program. It is not intended to diagnose MRSA infection nor to guide or monitor treatment for MRSA infections. Performed at [facility], [place] [address], [place], [place] [address]     RADIOLOGY STUDIES/RESULTS: No results found.    LOS: 2 days   Oren Binet, MD  [facility]    To contact the attending provider between 7A-7P or the covering provider during after hours 7P-7A, please log into the web site [email or web address] and access using universal [place] password for that web site. If you do not have the password, please call the hospital operator.  03/06/2021, 2:44 PM

## 2021-03-07 ENCOUNTER — Inpatient Hospital Stay (HOSPITAL_COMMUNITY): Payer: Medicare Other

## 2021-03-07 LAB — BASIC METABOLIC PANEL
Anion gap: 7 (ref 5–15)
BUN: 44 mg/dL — ABNORMAL HIGH (ref 8–23)
CO2: 25 mmol/L (ref 22–32)
Calcium: 8.7 mg/dL — ABNORMAL LOW (ref 8.9–10.3)
Chloride: 102 mmol/L (ref 98–111)
Creatinine, Ser: 1.13 mg/dL (ref 0.61–1.24)
GFR, Estimated: >60 mL/min (ref >60)
Glucose, Bld: 153 mg/dL — ABNORMAL HIGH (ref 70–99)
Potassium: 4.4 mmol/L (ref 3.5–5.1)
Sodium: 134 mmol/L — ABNORMAL LOW (ref 135–145)

## 2021-03-07 LAB — CBC
HCT: 41.8 % (ref 39.0–52.0)
Hemoglobin: 14.6 g/dL (ref 13.0–17.0)
MCH: 31.9 pg (ref 26.0–34.0)
MCHC: 34.9 g/dL (ref 30.0–36.0)
MCV: 91.3 fL (ref 80.0–100.0)
Platelets: 201 10*3/uL (ref 150–400)
RBC: 4.58 MIL/uL (ref 4.22–5.81)
RDW: 13.5 % (ref 11.5–15.5)
WBC: 14.5 10*3/uL — ABNORMAL HIGH (ref 4.0–10.5)
nRBC: 0 % (ref 0.0–0.2)

## 2021-03-07 MED ORDER — PANTOPRAZOLE SODIUM 40 MG PO TBEC
40.0000 mg | DELAYED_RELEASE_TABLET | Freq: Every day | ORAL | Status: DC
  Administered 2021-03-07 – 2021-03-13 (×7): 40 mg via ORAL
  Filled 2021-03-07 (×7): qty 1

## 2021-03-07 MED ORDER — IPRATROPIUM-ALBUTEROL 0.5-2.5 (3) MG/3ML IN SOLN
3.0000 mL | RESPIRATORY_TRACT | Status: DC
  Administered 2021-03-07 – 2021-03-13 (×35): 3 mL via RESPIRATORY_TRACT
  Filled 2021-03-07 (×36): qty 3

## 2021-03-07 NOTE — Evaluation (Signed)
Physical Therapy Evaluation Patient Details Name: Mark Tate MRN: [id number] DOB: [birth year] Today's Date: 03/07/2021   History of Present Illness  70yo male admitted 6/7 with c/o SOB and DOE after exposure to a Covid + family member 5 days ago. Covid negative. Hypoxic to 85% in ED and required 4LPM O2. Found to have acute hypoxic/hypercarbic respiratory failure with COPD exacerbation. PMH CAD, COPD, SOB, tobacco use  Clinical Impression   Patient received in bed, pleasant and cooperative. Spouse present during session and observed mobility. Able to generally mobilize on a min guard level with RW for short distances/multiple laps in room- HR and SPO2 WNL on 4LPM, however RR did increase to as much as 36-38 with activity. Cues provided for energy conservation and appropriate breathing techniques. Also educated on use of IS as well as deep, belly breathing for optimal lung expansion. Left in bed with all needs met, bed alarm and spouse present. Will benefit from HHPT f/u.     Follow Up Recommendations Home health PT    Equipment Recommendations  Other (comment);3in1 (PT) (rollator)    Recommendations for Other Services       Precautions / Restrictions Precautions Precautions: Other (comment) Precaution Comments: watch O2/BP/RR Restrictions Weight Bearing Restrictions: No      Mobility  Bed Mobility Overal bed mobility: Needs Assistance Bed Mobility: Supine to Sit;Sit to Supine     Supine to sit: Min guard Sit to supine: Min guard   General bed mobility comments: min guard, assist with line management    Transfers Overall transfer level: Needs assistance Equipment used: Rolling walker (2 wheeled) Transfers: Sit to/from Stand Sit to Stand: Min guard         General transfer comment: min guard for safety, no physical assist given but did need cues for hand placement/sequencing  Ambulation/Gait Ambulation/Gait assistance: Min guard Gait Distance (Feet): 36 Feet (laps  in room) Assistive device: Rolling walker (2 wheeled) Gait Pattern/deviations: Step-through pattern Gait velocity: decreased   General Gait Details: slow and steady with RW, assist for managing multiple lines, also cues for appropriate breathing techniques with activity. SPO2 >95%, RR as high as 36-38, HR WL. Significantly increased WOB  Stairs            Wheelchair Mobility    Modified Rankin (Stroke Patients Only)       Balance Overall balance assessment: Mild deficits observed, not formally tested                                           Pertinent Vitals/Pain Pain Assessment: No/denies pain    Home Living Family/patient expects to be discharged to:: Private residence Living Arrangements: Spouse/significant other Available Help at Discharge: Family Type of Home: House Home Access: Stairs to enter Entrance Stairs-Rails: Left Entrance Stairs-Number of Steps: 5STE from garage entry with L ascending rail Home Layout: One level Home Equipment: Clinical cytogeneticist - 2 wheels Additional Comments: wife has equipment from past LE fracture    Prior Function Level of Independence: Independent         Comments: still driving, no falls or stumbles, generally sedentary     Hand Dominance        Extremity/Trunk Assessment   Upper Extremity Assessment Upper Extremity Assessment: Defer to OT evaluation    Lower Extremity Assessment Lower Extremity Assessment: Generalized weakness    Cervical / Trunk Assessment Cervical /  Trunk Assessment: Normal  Communication   Communication: No difficulties  Cognition Arousal/Alertness: Awake/alert Behavior During Therapy: WFL for tasks assessed/performed Overall Cognitive Status: Within Functional Limits for tasks assessed                                 General Comments: very talkative and slight reduction in awareness of deficits, suspect this may be baseline      General Comments       Exercises     Assessment/Plan    PT Assessment Patient needs continued PT services  PT Problem List Decreased strength;Decreased knowledge of use of DME;Decreased activity tolerance;Decreased safety awareness;Decreased balance;Decreased knowledge of precautions;Cardiopulmonary status limiting activity;Decreased mobility       PT Treatment Interventions DME instruction;Balance training;Gait training;Stair training;Functional mobility training;Patient/family education;Therapeutic activities;Wheelchair mobility training;Therapeutic exercise    PT Goals (Current goals can be found in the Care Plan section)  Acute Rehab PT Goals Patient Stated Goal: go home, be able to breathe better PT Goal Formulation: With patient/family Time For Goal Achievement: 03/21/21 Potential to Achieve Goals: Fair    Frequency Min 3X/week   Barriers to discharge        Co-evaluation               AM-PAC PT "6 Clicks" Mobility  Outcome Measure Help needed turning from your back to your side while in a flat bed without using bedrails?: None Help needed moving from lying on your back to sitting on the side of a flat bed without using bedrails?: None Help needed moving to and from a bed to a chair (including a wheelchair)?: A Little Help needed standing up from a chair using your arms (e.g., wheelchair or bedside chair)?: A Little Help needed to walk in hospital room?: A Little Help needed climbing 3-5 steps with a railing? : A Little 6 Click Score: 20    End of Session Equipment Utilized During Treatment: Oxygen Activity Tolerance: Patient tolerated treatment well Patient left: in bed;with call bell/phone within reach;with bed alarm set;with family/visitor present Nurse Communication: Mobility status PT Visit Diagnosis: Muscle weakness (generalized) (M62.81);Difficulty in walking, not elsewhere classified (R26.2)    Time: 5003-7048 PT Time Calculation (min) (ACUTE ONLY): 47  min   Charges:   PT Evaluation $PT Eval Moderate Complexity: 1 Mod (co-eval with OT) PT Treatments $Gait Training: 8-22 mins      Windell Norfolk, DPT, PN1   Supplemental Physical Therapist [place]    Pager [phone] Acute Rehab Office [phone]

## 2021-03-07 NOTE — Progress Notes (Signed)
PROGRESS NOTE        PATIENT DETAILS Name: Mark Tate Age: 70 y.o. Sex: male Date of Birth: Apr 28, 1951 Admit Date: 03/04/2021 Admitting Physician Albertine Patricia, MD OAC:[id number], L.Marlou Sa, MD  Brief Narrative: Patient is a 70 y.o. male with history of COPD, chronic systolic heart failure (EF improved on last echo) presented with 3-4-day history of cough/worsening shortness of breath-found to have acute hypoxic respiratory failure requiring BiPAP support due to COPD exacerbation.  Significant events: 6/7>> admit for COPD exacerbation-on BiPAP 6/8>> off BiPAP-transitioned to 5 L via nasal cannula  Significant studies: 01/01/2020>> Echo: EF 30-35% 04/22/2020>> Echo: EF 60-65% 03/04/2021>> CXR: No obvious pneumonia 03/07/21>> CXR: No obvious consolidation-chronic interstitial thickening.  Antimicrobial therapy: Rocephin: 6/7>> Zithromax: 6/7>>  Microbiology data: 6/7>> COVID PCR: Negative  Procedures : None  Consults: None  DVT Prophylaxis : enoxaparin (LOVENOX) injection 40 mg Start: 03/04/21 2200   Subjective: Developed shortness of breath last night-and required BiPAP support.  Titrated off BiPAP earlier this morning-back down to 2-3 L of oxygen.  Still wheezing.   Assessment/Plan: Acute hypoxic respiratory failure due to COPD exacerbation: Slow clinical improvement continues-he is much better than how he first presented to the hospital-still with significant expiratory wheezing this morning.  Continue steroids-we will adjust bronchodilator regimen to make it a little bit more frequent.  No evidence of volume overload.  Continue empiric IV antibiotics x5 days.    AKI: Likely hemodynamically mediated due to diuretics-resolved.  Hyponatremia: Likely due to diuretics-significantly improved  History of chronic combined systolic/diastolic heart failure: Most recent echo with improved EF-no evidence of volume overload-continue supportive care-no  further doses of Lasix planned at this point.  History of nonobstructive CAD: No anginal symptoms-continue aspirin/statin/Entresto-given diffuse wheezing-hold Coreg for a few days.  Tobacco abuse: Counseled-continue transdermal nicotine.  Diet: Diet Order             Diet Heart Room service appropriate? Yes; Fluid consistency: Thin  Diet effective now                    Code Status: Full code   Family Communication: Spouse at bedside  Disposition Plan: Status is: Inpatient  Remains inpatient appropriate because:Inpatient level of care appropriate due to severity of illness   Dispo: The patient is from: Home              Anticipated d/c is to: Home              Patient currently is not medically stable to d/c.   Difficult to place patient No    Barriers to Discharge: COPD exacerbation-still with coarse rhonchi all over-still on requiring oxygen supplementation-not yet at baseline and not stable for discharge.    Antimicrobial agents: Anti-infectives (From admission, onward)    Start     Dose/Rate Route Frequency Ordered Stop   03/05/21 1200  cefTRIAXone (ROCEPHIN) 1 g in sodium chloride 0.9 % 100 mL IVPB        1 g 200 mL/hr over 30 Minutes Intravenous Every 24 hours 03/04/21 2040 03/10/21 1159   03/05/21 1200  azithromycin (ZITHROMAX) 500 mg in sodium chloride 0.9 % 250 mL IVPB        500 mg 250 mL/hr over 60 Minutes Intravenous Every 24 hours 03/04/21 1510 03/10/21 1159   03/04/21 1330  cefTRIAXone (ROCEPHIN) 1 g  in sodium chloride 0.9 % 100 mL IVPB        1 g 200 mL/hr over 30 Minutes Intravenous  Once 03/04/21 1324 03/04/21 1437   03/04/21 1330  azithromycin (ZITHROMAX) 500 mg in sodium chloride 0.9 % 250 mL IVPB        500 mg 250 mL/hr over 60 Minutes Intravenous  Once 03/04/21 1324 03/04/21 1603        Time spent: 35 minutes-Greater than 50% of this time was spent in counseling, explanation of diagnosis, planning of further management, and  coordination of care.  MEDICATIONS: Scheduled Meds:  aspirin EC  81 mg Oral Daily   atorvastatin  40 mg Oral Daily   budesonide (PULMICORT) nebulizer solution  0.5 mg Nebulization BID   chlorhexidine  15 mL Mouth Rinse BID   enoxaparin (LOVENOX) injection  40 mg Subcutaneous Q24H   fluticasone  2 spray Each Nare Daily   guaiFENesin  1,200 mg Oral BID   ipratropium-albuterol  3 mL Nebulization QID   loratadine  10 mg Oral Daily   mouth rinse  15 mL Mouth Rinse q12n4p   methylPREDNISolone (SOLU-MEDROL) injection  40 mg Intravenous Q8H   nicotine  21 mg Transdermal Daily   omega-3 acid ethyl esters  1 g Oral BID   polyethylene glycol  17 g Oral Daily   sacubitril-valsartan  1 tablet Oral BID   Continuous Infusions:  azithromycin 500 mg (03/07/21 1117)   cefTRIAXone (ROCEPHIN)  IV Stopped (03/06/21 1445)   PRN Meds:.acetaminophen, albuterol, benzonatate, bisacodyl   PHYSICAL EXAM: Vital signs: Vitals:   03/06/21 2349 03/07/21 0451 03/07/21 0806 03/07/21 0835  BP: 112/85 133/87 (!) 145/82   Pulse: 80 80 85   Resp: 15 17 19    Temp: 97.8 F (36.6 C) 97.9 F (36.6 C) 97.6 F (36.4 C)   TempSrc: Axillary Axillary Oral   SpO2: 100% 99% 97% 99%  Weight:      Height:       Filed Weights   03/04/21 1758  Weight: 81.6 kg   Body mass index is 25.1 kg/m.   Gen Exam:Alert awake-not in any distress-slightly anxious HEENT:atraumatic, normocephalic Chest: Continues to have diffuse expiratory wheezing-however moving air. CVS:S1S2 regular Abdomen:soft non tender, non distended Extremities:no edema Neurology: Non focal Skin: no rash   I have personally reviewed following labs and imaging studies  LABORATORY DATA: CBC: Recent Labs  Lab 03/04/21 1025 03/04/21 1320 03/05/21 0050 03/06/21 0231 03/07/21 0323  WBC 11.2*  --  11.5* 16.9* 14.5*  NEUTROABS 8.0*  --   --   --   --   HGB 16.1 15.0 15.6 14.3 14.6  HCT 47.5 44.0 44.8 42.0 41.8  MCV 93.5  --  92.0 92.3 91.3  PLT  186  --  168 182 201     Basic Metabolic Panel: Recent Labs  Lab 03/04/21 1025 03/04/21 1320 03/05/21 0050 03/06/21 0231 03/07/21 0323  NA 133* 135 132* 129* 134*  K 4.3 4.5 4.1 4.2 4.4  CL 101  --  99 97* 102  CO2 22  --  25 24 25   GLUCOSE 117*  --  161* 154* 153*  BUN 19  --  26* 46* 44*  CREATININE 1.12  --  1.20 1.42* 1.13  CALCIUM 9.1  --  8.6* 8.5* 8.7*     GFR: Estimated Creatinine Clearance: 62.9 mL/min (by C-G formula based on SCr of 1.13 mg/dL).  Liver Function Tests: Recent Labs  Lab 03/04/21 1323  AST  45*  ALT 21  ALKPHOS 62  BILITOT 1.1  PROT 6.9  ALBUMIN 3.6    No results for input(s): LIPASE, AMYLASE in the last 168 hours. No results for input(s): AMMONIA in the last 168 hours.  Coagulation Profile: No results for input(s): INR, PROTIME in the last 168 hours.  Cardiac Enzymes: No results for input(s): CKTOTAL, CKMB, CKMBINDEX, TROPONINI in the last 168 hours.  BNP (last 3 results) No results for input(s): PROBNP in the last 8760 hours.  Lipid Profile: No results for input(s): CHOL, HDL, LDLCALC, TRIG, CHOLHDL, LDLDIRECT in the last 72 hours.  Thyroid Function Tests: No results for input(s): TSH, T4TOTAL, FREET4, T3FREE, THYROIDAB in the last 72 hours.  Anemia Panel: No results for input(s): VITAMINB12, FOLATE, FERRITIN, TIBC, IRON, RETICCTPCT in the last 72 hours.  Urine analysis:    Component Value Date/Time   COLORURINE AMBER (A) 03/04/2021 1626   APPEARANCEUR HAZY (A) 03/04/2021 1626   LABSPEC 1.024 03/04/2021 1626   PHURINE 5.0 03/04/2021 1626   GLUCOSEU NEGATIVE 03/04/2021 1626   HGBUR NEGATIVE 03/04/2021 1626   BILIRUBINUR NEGATIVE 03/04/2021 1626   KETONESUR 5 (A) 03/04/2021 1626   PROTEINUR 30 (A) 03/04/2021 1626   NITRITE NEGATIVE 03/04/2021 1626   LEUKOCYTESUR NEGATIVE 03/04/2021 1626    Sepsis Labs: Lactic Acid, Venous    Component Value Date/Time   LATICACIDVEN 1.9 03/04/2021 1626    MICROBIOLOGY: Recent  Results (from the past 240 hour(s))  Resp Panel by RT-PCR (Flu A&B, Covid) Nasopharyngeal Swab     Status: None   Collection Time: 03/04/21 10:13 AM   Specimen: Nasopharyngeal Swab; Nasopharyngeal(NP) swabs in vial transport medium  Result Value Ref Range Status   SARS Coronavirus 2 by RT PCR NEGATIVE NEGATIVE Final    Comment: (NOTE) SARS-CoV-2 target nucleic acids are NOT DETECTED.  The SARS-CoV-2 RNA is generally detectable in upper respiratory specimens during the acute phase of infection. The lowest concentration of SARS-CoV-2 viral copies this assay can detect is 138 copies/mL. A negative result does not preclude SARS-Cov-2 infection and should not be used as the sole basis for treatment or other patient management decisions. A negative result may occur with  improper specimen collection/handling, submission of specimen other than nasopharyngeal swab, presence of viral mutation(s) within the areas targeted by this assay, and inadequate number of viral copies(<138 copies/mL). A negative result must be combined with clinical observations, patient history, and epidemiological information. The expected result is Negative.  Fact Sheet for Patients:  EntrepreneurPulse.com.au  Fact Sheet for Healthcare Providers:  IncredibleEmployment.be  This test is no t yet approved or cleared by the [place] FDA and  has been authorized for detection and/or diagnosis of SARS-CoV-2 by FDA under an Emergency Use Authorization (EUA). This EUA will remain  in effect (meaning this test can be used) for the duration of the COVID-19 declaration under Section 564(b)(1) of the Act, 21 U.S.C.section 360bbb-3(b)(1), unless the authorization is terminated  or revoked sooner.       Influenza A by PCR NEGATIVE NEGATIVE Final   Influenza B by PCR NEGATIVE NEGATIVE Final    Comment: (NOTE) The Xpert Xpress SARS-CoV-2/FLU/RSV plus assay is intended as an aid in the  diagnosis of influenza from Nasopharyngeal swab specimens and should not be used as a sole basis for treatment. Nasal washings and aspirates are unacceptable for Xpert Xpress SARS-CoV-2/FLU/RSV testing.  Fact Sheet for Patients: EntrepreneurPulse.com.au  Fact Sheet for Healthcare Providers: IncredibleEmployment.be  This test is not yet approved or  cleared by the [place] and has been authorized for detection and/or diagnosis of SARS-CoV-2 by FDA under an Emergency Use Authorization (EUA). This EUA will remain in effect (meaning this test can be used) for the duration of the COVID-19 declaration under Section 564(b)(1) of the Act, 21 U.S.C. section 360bbb-3(b)(1), unless the authorization is terminated or revoked.  Performed at [facility], [place] [address], [place], [place] [address]   MRSA PCR Screening     Status: None   Collection Time: 03/05/21  6:28 AM   Specimen: Nasal Mucosa; Nasopharyngeal  Result Value Ref Range Status   MRSA by PCR NEGATIVE NEGATIVE Final    Comment:        The GeneXpert MRSA Assay (FDA approved for NASAL specimens only), is one component of a comprehensive MRSA colonization surveillance program. It is not intended to diagnose MRSA infection nor to guide or monitor treatment for MRSA infections. Performed at [facility], [place] [address], [place], [place] [address]     RADIOLOGY STUDIES/RESULTS: DG Chest Port 1V same Day  Result Date: 03/07/2021 CLINICAL DATA:  Shortness of breath EXAM: PORTABLE CHEST 1 VIEW COMPARISON:  March 04, 2021 FINDINGS: Interstitial thickening is stable. No edema or airspace opacity. The heart size is normal. Pulmonary vascularity is within normal limits and stable. No adenopathy. No bone lesions. IMPRESSION: Interstitial thickening, likely due to a degree of underlying chronic bronchitis. No edema or consolidation. Stable cardiac silhouette. Electronically Signed   By:  Lowella Grip III M.D.   On: 03/07/2021 09:30      LOS: 3 days   Oren Binet, MD  [facility]    To contact the attending provider between 7A-7P or the covering provider during after hours 7P-7A, please log into the web site [email or web address] and access using universal [place] password for that web site. If you do not have the password, please call the hospital operator.  03/07/2021, 12:10 PM

## 2021-03-07 NOTE — Evaluation (Signed)
Occupational Therapy Evaluation Patient Details Name: Mark Tate MRN: [id number] DOB: Dec 30, 1950 Today's Date: 03/07/2021    History of Present Illness 70yo male admitted 6/7 with c/o SOB and DOE after exposure to a Covid + family member 5 days ago. Covid negative. Hypoxic to 85% in ED and required 4LPM O2. Found to have acute hypoxic/hypercarbic respiratory failure with COPD exacerbation. PMH CAD, COPD, SOB, tobacco use   Clinical Impression   PTA, pt was living at home with his wife, he reports he was independent with ADL/IADL and functional mobility without AD. Pt currently requires minguard for functional mobility and moderate to maximum cues for pursed lip breathing. Pt required cues to redirect attention to slow breathing during ADL/in room mobility as RR would increase to 38 at times. Began education on energy conservation strategies, but pt would benefit from additional education at next session. Provided pt with incentive spirometer and educated pt on proper breathing techniques.    Due to decline in current level of function, pt would benefit from acute OT to address established goals to facilitate safe D/C to venue listed below. At this time, recommend HHOT follow-up. Will continue to follow acutely.     Follow Up Recommendations  Home health OT    Equipment Recommendations  Other (comment) (rollator)    Recommendations for Other Services       Precautions / Restrictions Precautions Precautions: Other (comment) Precaution Comments: watch O2/BP/RR Restrictions Weight Bearing Restrictions: No      Mobility Bed Mobility Overal bed mobility: Needs Assistance Bed Mobility: Supine to Sit;Sit to Supine     Supine to sit: Min guard Sit to supine: Min guard   General bed mobility comments: min guard, assist with line management    Transfers Overall transfer level: Needs assistance Equipment used: Rolling walker (2 wheeled) Transfers: Sit to/from Stand Sit to Stand:  Min guard         General transfer comment: min guard for safety, no physical assist given but did need cues for hand placement/sequencing    Balance Overall balance assessment: Mild deficits observed, not formally tested                                         ADL either performed or assessed with clinical judgement   ADL Overall ADL's : Needs assistance/impaired Eating/Feeding: Set up;Sitting   Grooming: Sitting;Standing;Min guard   Upper Body Bathing: Min guard;Sitting   Lower Body Bathing: Min guard;Sit to/from stand   Upper Body Dressing : Set up;Sitting   Lower Body Dressing: Min guard;Sit to/from stand   Toilet Transfer: Min guard   Toileting- Water quality scientist and Hygiene: Min guard       Functional mobility during ADLs: Min guard;Rolling walker General ADL Comments: pt with increased DoE and WoB with exertion RR up to 38 at times. SpO2 >97% on 4lnc with exertion. Pt required frequent cues for pursed lip breathing and focusing on wob during mobiltiy. Pt very talkative during session resulting in increased RR, able to reduce RR to upper 20s with cues for increased awareness to breath     Vision Baseline Vision/History: Wears glasses Wears Glasses: Reading only Patient Visual Report: No change from baseline       Perception     Praxis      Pertinent Vitals/Pain Pain Assessment: No/denies pain     Hand Dominance Right   Extremity/Trunk Assessment  Upper Extremity Assessment Upper Extremity Assessment: Generalized weakness   Lower Extremity Assessment Lower Extremity Assessment: Generalized weakness   Cervical / Trunk Assessment Cervical / Trunk Assessment: Normal   Communication Communication Communication: No difficulties   Cognition Arousal/Alertness: Awake/alert Behavior During Therapy: WFL for tasks assessed/performed Overall Cognitive Status: Within Functional Limits for tasks assessed                                  General Comments: very talkative and slight reduction in awareness of deficits, suspect this may be baseline   General Comments       Exercises Exercises: Other exercises Other Exercises Other Exercises: educated pt on belly breathing, importance of pursed lip breathing, and use of IS Other Exercises: began education on energy conservation strategies   Shoulder Instructions      Home Living Family/patient expects to be discharged to:: Private residence Living Arrangements: Spouse/significant other Available Help at Discharge: Family Type of Home: House Home Access: Stairs to enter Technical brewer of Steps: 5STE from garage entry with L ascending rail Entrance Stairs-Rails: Left Home Layout: One level     Bathroom Shower/Tub: Teacher, early years/pre: Standard ("comfort height")     Home Equipment: Clinical cytogeneticist - 2 wheels   Additional Comments: wife has equipment from past LE fracture      Prior Functioning/Environment Level of Independence: Independent        Comments: still driving, no falls or stumbles, generally sedentary        OT Problem List: Decreased activity tolerance;Impaired balance (sitting and/or standing);Decreased safety awareness;Decreased knowledge of precautions;Cardiopulmonary status limiting activity      OT Treatment/Interventions: Self-care/ADL training;Therapeutic exercise;Energy conservation;DME and/or AE instruction;Therapeutic activities;Patient/family education;Balance training    OT Goals(Current goals can be found in the care plan section) Acute Rehab OT Goals Patient Stated Goal: go home, be able to breathe better OT Goal Formulation: With patient Time For Goal Achievement: 03/21/21 Potential to Achieve Goals: Good ADL Goals Pt Will Transfer to Toilet: with modified independence;ambulating Additional ADL Goal #1: Pt will demonstrate independence with 3 energy conservation strategies during  ADL/IADL and functional mobility. Additional ADL Goal #2: Pt will complete OOB ADL for 65min independently initiating use of seated rest breaks as needed.  OT Frequency: Min 2X/week   Barriers to D/C:            Co-evaluation PT/OT/SLP Co-Evaluation/Treatment: Yes Reason for Co-Treatment: For patient/therapist safety;To address functional/ADL transfers   OT goals addressed during session: ADL's and self-care      AM-PAC OT "6 Clicks" Daily Activity     Outcome Measure Help from another person eating meals?: None Help from another person taking care of personal grooming?: A Little Help from another person toileting, which includes using toliet, bedpan, or urinal?: A Little Help from another person bathing (including washing, rinsing, drying)?: A Little Help from another person to put on and taking off regular upper body clothing?: None Help from another person to put on and taking off regular lower body clothing?: A Little 6 Click Score: 20   End of Session Equipment Utilized During Treatment: Rolling walker;Oxygen Nurse Communication: Mobility status  Activity Tolerance: Patient tolerated treatment well Patient left: in bed;with call bell/phone within reach;with bed alarm set  OT Visit Diagnosis: Other abnormalities of gait and mobility (R26.89);Muscle weakness (generalized) (M62.81)  Time: 3151-7616 OT Time Calculation (min): 47 min Charges:  OT General Charges $OT Visit: 1 Visit OT Evaluation $OT Eval Moderate Complexity: [place] OTR/L Acute Rehabilitation Services Office: [place] 03/07/2021, 12:42 PM

## 2021-03-07 NOTE — Progress Notes (Signed)
   03/07/21 1246  Incentive Spirometry  Incentive Spirometry Goal (mL) (RN or RT) 1000 mL  Incentive Spirometry - Achieved (mL) (RN, NT, or RT) 1500 mL  Incentive Spirometry - # of Times (RN or NT) 3  Incentive Spirometry Effort (RN) Satisfactory  Incentive Spirometry Use (NT) Observed, patient had no questions   Pt Educated on [employer] and flutter valve, pt was able to demonstrate [place] use of both to [employer]

## 2021-03-07 NOTE — Plan of Care (Signed)

## 2021-03-07 NOTE — Care Management Important Message (Signed)
Important Message  Patient Details  Name: Mark Tate MRN: [id number] Date of Birth: 1951-04-25   Medicare Important Message Given:  Yes - Important Message mailed due to current National Emergency   Verbal consent obtained due to current National Emergency  Relationship to patient: Self Contact Name: Oiva Call Date: 03/07/21  Time: 1059 Phone: [phone] Outcome: Spoke with contact Important Message mailed to: Patient address on file    Delorse Lek 03/07/2021, 10:59 AM

## 2021-03-08 ENCOUNTER — Inpatient Hospital Stay (HOSPITAL_COMMUNITY): Payer: Medicare Other

## 2021-03-08 LAB — COMPREHENSIVE METABOLIC PANEL
ALT: 61 U/L — ABNORMAL HIGH (ref 0–44)
AST: 45 U/L — ABNORMAL HIGH (ref 15–41)
Albumin: 2.7 g/dL — ABNORMAL LOW (ref 3.5–5.0)
Alkaline Phosphatase: 39 U/L (ref 38–126)
Anion gap: 8 (ref 5–15)
BUN: 30 mg/dL — ABNORMAL HIGH (ref 8–23)
CO2: 24 mmol/L (ref 22–32)
Calcium: 8.5 mg/dL — ABNORMAL LOW (ref 8.9–10.3)
Chloride: 104 mmol/L (ref 98–111)
Creatinine, Ser: 0.98 mg/dL (ref 0.61–1.24)
GFR, Estimated: >60 mL/min (ref >60)
Glucose, Bld: 140 mg/dL — ABNORMAL HIGH (ref 70–99)
Potassium: 4.3 mmol/L (ref 3.5–5.1)
Sodium: 136 mmol/L (ref 135–145)
Total Bilirubin: 0.7 mg/dL (ref 0.3–1.2)
Total Protein: 5.3 g/dL — ABNORMAL LOW (ref 6.5–8.1)

## 2021-03-08 LAB — CBC
HCT: 41.9 % (ref 39.0–52.0)
Hemoglobin: 14.3 g/dL (ref 13.0–17.0)
MCH: 31.4 pg (ref 26.0–34.0)
MCHC: 34.1 g/dL (ref 30.0–36.0)
MCV: 91.9 fL (ref 80.0–100.0)
Platelets: 200 10*3/uL (ref 150–400)
RBC: 4.56 MIL/uL (ref 4.22–5.81)
RDW: 13.6 % (ref 11.5–15.5)
WBC: 11.3 10*3/uL — ABNORMAL HIGH (ref 4.0–10.5)
nRBC: 0 % (ref 0.0–0.2)

## 2021-03-08 LAB — BRAIN NATRIURETIC PEPTIDE
B Natriuretic Peptide: 50.6 pg/mL (ref 0.0–100.0)
B Natriuretic Peptide: 54.3 pg/mL (ref 0.0–100.0)

## 2021-03-08 MED ORDER — METHYLPREDNISOLONE SODIUM SUCC 40 MG IJ SOLR
40.0000 mg | Freq: Two times a day (BID) | INTRAMUSCULAR | Status: DC
  Administered 2021-03-09 – 2021-03-11 (×5): 40 mg via INTRAVENOUS
  Filled 2021-03-08 (×5): qty 1

## 2021-03-08 NOTE — Progress Notes (Signed)
Overnight floor coverage progress note  RN reported that patient became tachypneic and had pursed lip breathing.  No change in oxygen saturation.  Reported coarse breath sounds and wheezing on auscultation of the lungs.  He was given a bronchodilator treatment and work of breathing has now improved.  Respiratory rate currently <25.  -Stat chest x-ray and BNP ordered.  Give IV Lasix if work-up is suggestive of pulmonary edema.

## 2021-03-08 NOTE — Progress Notes (Signed)
PROGRESS NOTE        PATIENT DETAILS Name: Mark Tate Age: 70 y.o. Sex: male Date of Birth: December 26, 1950 Admit Date: 03/04/2021 Admitting Physician Albertine Patricia, MD [id number], L.Marlou Sa, MD  Brief Narrative: Patient is a 70 y.o. male with history of COPD, chronic systolic heart failure (EF improved on last echo) presented with 3-4-day history of cough/worsening shortness of breath-found to have acute hypoxic respiratory failure requiring BiPAP support due to COPD exacerbation.  Significant events: 6/7>> admit for COPD exacerbation-on BiPAP 6/8>> off BiPAP-transitioned to 5 L via nasal cannula  Significant studies: 01/01/2020>> Echo: EF 30-35% 04/22/2020>> Echo: EF 60-65% 03/04/2021>> CXR: No obvious pneumonia 03/07/21>> CXR: No obvious consolidation-chronic interstitial thickening.  Antimicrobial therapy: Rocephin: 6/7>> Zithromax: 6/7>>  Microbiology data: 6/7>> COVID PCR: Negative  Procedures : None  Consults: None  DVT Prophylaxis : enoxaparin (LOVENOX) injection 40 mg Start: 03/04/21 2200   Subjective: Feels better-no major issues overnight-still wheezing-unclear why he was placed on BiPAP last night.  On 2 L of oxygen this morning.   Assessment/Plan: Acute hypoxic respiratory failure due to COPD exacerbation: Slow clinical improvement continues-still wheezing but overall much better compared to the past few days.  Continue IV steroids-begin to taper further-continue bronchodilators.  Remains on Rocephin/Zithromax x5 days total.  Have asked patient/spouse (at bedside) to continue to perform incentive spirometry/flutter valve-RN aware need to mobilize/out of bed to chair.  AKI: Likely hemodynamically mediated due to diuretics-resolved.  Hyponatremia: Likely due to diuretics-resolved.  History of chronic combined systolic/diastolic heart failure: Most recent echo with improved EF-no evidence of volume overload-continue supportive care-no  further doses of Lasix planned at this point.  History of nonobstructive CAD: No anginal symptoms-continue aspirin/statin/Entresto-given diffuse wheezing-hold Coreg for a few days.  Tobacco abuse: Counseled-continue transdermal nicotine.  Diet: Diet Order             Diet Heart Room service appropriate? Yes; Fluid consistency: Thin  Diet effective now                    Code Status: Full code   Family Communication: Spouse at bedside  Disposition Plan: Status is: Inpatient  Remains inpatient appropriate because:Inpatient level of care appropriate due to severity of illness   Dispo: The patient is from: Home              Anticipated d/c is to: Home              Patient currently is not medically stable to d/c.   Difficult to place patient No    Barriers to Discharge: COPD exacerbation-still with coarse rhonchi all over-still on requiring oxygen supplementation-not yet at baseline and not stable for discharge.    Antimicrobial agents: Anti-infectives (From admission, onward)    Start     Dose/Rate Route Frequency Ordered Stop   03/05/21 1200  cefTRIAXone (ROCEPHIN) 1 g in sodium chloride 0.9 % 100 mL IVPB        1 g 200 mL/hr over 30 Minutes Intravenous Every 24 hours 03/04/21 2040 03/10/21 1159   03/05/21 1200  azithromycin (ZITHROMAX) 500 mg in sodium chloride 0.9 % 250 mL IVPB        500 mg 250 mL/hr over 60 Minutes Intravenous Every 24 hours 03/04/21 1510 03/10/21 1159   03/04/21 1330  cefTRIAXone (ROCEPHIN) 1 g in sodium chloride 0.9 %  100 mL IVPB        1 g 200 mL/hr over 30 Minutes Intravenous  Once 03/04/21 1324 03/04/21 1437   03/04/21 1330  azithromycin (ZITHROMAX) 500 mg in sodium chloride 0.9 % 250 mL IVPB        500 mg 250 mL/hr over 60 Minutes Intravenous  Once 03/04/21 1324 03/04/21 1603        Time spent: 35 minutes-Greater than 50% of this time was spent in counseling, explanation of diagnosis, planning of further management, and  coordination of care.  MEDICATIONS: Scheduled Meds:  aspirin EC  81 mg Oral Daily   atorvastatin  40 mg Oral Daily   budesonide (PULMICORT) nebulizer solution  0.5 mg Nebulization BID   chlorhexidine  15 mL Mouth Rinse BID   enoxaparin (LOVENOX) injection  40 mg Subcutaneous Q24H   fluticasone  2 spray Each Nare Daily   guaiFENesin  1,200 mg Oral BID   ipratropium-albuterol  3 mL Nebulization Q4H   loratadine  10 mg Oral Daily   mouth rinse  15 mL Mouth Rinse q12n4p   methylPREDNISolone (SOLU-MEDROL) injection  40 mg Intravenous Q8H   nicotine  21 mg Transdermal Daily   omega-3 acid ethyl esters  1 g Oral BID   pantoprazole  40 mg Oral Q1200   polyethylene glycol  17 g Oral Daily   sacubitril-valsartan  1 tablet Oral BID   Continuous Infusions:  azithromycin 500 mg (03/08/21 1128)   cefTRIAXone (ROCEPHIN)  IV 1 g (03/08/21 1306)   PRN Meds:.acetaminophen, albuterol, benzonatate, bisacodyl   PHYSICAL EXAM: Vital signs: Vitals:   03/08/21 0747 03/08/21 0754 03/08/21 1113 03/08/21 1115  BP:  (!) 146/96  136/81  Pulse:  87  77  Resp:  20  20  Temp:  98.2 F (36.8 C)  98.3 F (36.8 C)  TempSrc:  Axillary  Axillary  SpO2: 99% 98% 97% 99%  Weight:      Height:       Filed Weights   03/04/21 1758  Weight: 81.6 kg   Body mass index is 25.1 kg/m.   Gen Exam:Alert awake-not in any distress HEENT:atraumatic, normocephalic Chest: Moving air well-still with some rhonchi but markedly better compared to the past few days. CVS:S1S2 regular Abdomen:soft non tender, non distended Extremities:no edema Neurology: Non focal Skin: no rash   I have personally reviewed following labs and imaging studies  LABORATORY DATA: CBC: Recent Labs  Lab 03/04/21 1025 03/04/21 1320 03/05/21 0050 03/06/21 0231 03/07/21 0323 03/08/21 0752  WBC 11.2*  --  11.5* 16.9* 14.5* 11.3*  NEUTROABS 8.0*  --   --   --   --   --   HGB 16.1 15.0 15.6 14.3 14.6 14.3  HCT 47.5 44.0 44.8 42.0 41.8  41.9  MCV 93.5  --  92.0 92.3 91.3 91.9  PLT 186  --  168 182 201 200     Basic Metabolic Panel: Recent Labs  Lab 03/04/21 1025 03/04/21 1320 03/05/21 0050 03/06/21 0231 03/07/21 0323 03/08/21 0752  NA 133* 135 132* 129* 134* 136  K 4.3 4.5 4.1 4.2 4.4 4.3  CL 101  --  99 97* 102 104  CO2 22  --  25 24 25 24   GLUCOSE 117*  --  161* 154* 153* 140*  BUN 19  --  26* 46* 44* 30*  CREATININE 1.12  --  1.20 1.42* 1.13 0.98  CALCIUM 9.1  --  8.6* 8.5* 8.7* 8.5*  GFR: Estimated Creatinine Clearance: 72.6 mL/min (by C-G formula based on SCr of 0.98 mg/dL).  Liver Function Tests: Recent Labs  Lab 03/04/21 1323 03/08/21 0752  AST 45* 45*  ALT 21 61*  ALKPHOS 62 39  BILITOT 1.1 0.7  PROT 6.9 5.3*  ALBUMIN 3.6 2.7*    No results for input(s): LIPASE, AMYLASE in the last 168 hours. No results for input(s): AMMONIA in the last 168 hours.  Coagulation Profile: No results for input(s): INR, PROTIME in the last 168 hours.  Cardiac Enzymes: No results for input(s): CKTOTAL, CKMB, CKMBINDEX, TROPONINI in the last 168 hours.  BNP (last 3 results) No results for input(s): PROBNP in the last 8760 hours.  Lipid Profile: No results for input(s): CHOL, HDL, LDLCALC, TRIG, CHOLHDL, LDLDIRECT in the last 72 hours.  Thyroid Function Tests: No results for input(s): TSH, T4TOTAL, FREET4, T3FREE, THYROIDAB in the last 72 hours.  Anemia Panel: No results for input(s): VITAMINB12, FOLATE, FERRITIN, TIBC, IRON, RETICCTPCT in the last 72 hours.  Urine analysis:    Component Value Date/Time   COLORURINE AMBER (A) 03/04/2021 1626   APPEARANCEUR HAZY (A) 03/04/2021 1626   LABSPEC 1.024 03/04/2021 1626   PHURINE 5.0 03/04/2021 1626   GLUCOSEU NEGATIVE 03/04/2021 1626   HGBUR NEGATIVE 03/04/2021 1626   BILIRUBINUR NEGATIVE 03/04/2021 1626   KETONESUR 5 (A) 03/04/2021 1626   PROTEINUR 30 (A) 03/04/2021 1626   NITRITE NEGATIVE 03/04/2021 1626   LEUKOCYTESUR NEGATIVE 03/04/2021  1626    Sepsis Labs: Lactic Acid, Venous    Component Value Date/Time   LATICACIDVEN 1.9 03/04/2021 1626    MICROBIOLOGY: Recent Results (from the past 240 hour(s))  Resp Panel by RT-PCR (Flu A&B, Covid) Nasopharyngeal Swab     Status: None   Collection Time: 03/04/21 10:13 AM   Specimen: Nasopharyngeal Swab; Nasopharyngeal(NP) swabs in vial transport medium  Result Value Ref Range Status   SARS Coronavirus 2 by RT PCR NEGATIVE NEGATIVE Final    Comment: (NOTE) SARS-CoV-2 target nucleic acids are NOT DETECTED.  The SARS-CoV-2 RNA is generally detectable in upper respiratory specimens during the acute phase of infection. The lowest concentration of SARS-CoV-2 viral copies this assay can detect is 138 copies/mL. A negative result does not preclude SARS-Cov-2 infection and should not be used as the sole basis for treatment or other patient management decisions. A negative result may occur with  improper specimen collection/handling, submission of specimen other than nasopharyngeal swab, presence of viral mutation(s) within the areas targeted by this assay, and inadequate number of viral copies(<138 copies/mL). A negative result must be combined with clinical observations, patient history, and epidemiological information. The expected result is Negative.  Fact Sheet for Patients:  EntrepreneurPulse.com.au  Fact Sheet for Healthcare Providers:  IncredibleEmployment.be  This test is no t yet approved or cleared by the [place] FDA and  has been authorized for detection and/or diagnosis of SARS-CoV-2 by FDA under an Emergency Use Authorization (EUA). This EUA will remain  in effect (meaning this test can be used) for the duration of the COVID-19 declaration under Section 564(b)(1) of the Act, 21 U.S.C.section 360bbb-3(b)(1), unless the authorization is terminated  or revoked sooner.       Influenza A by PCR NEGATIVE NEGATIVE Final    Influenza B by PCR NEGATIVE NEGATIVE Final    Comment: (NOTE) The Xpert Xpress SARS-CoV-2/FLU/RSV plus assay is intended as an aid in the diagnosis of influenza from Nasopharyngeal swab specimens and should not be used as a sole  basis for treatment. Nasal washings and aspirates are unacceptable for Xpert Xpress SARS-CoV-2/FLU/RSV testing.  Fact Sheet for Patients: EntrepreneurPulse.com.au  Fact Sheet for Healthcare Providers: IncredibleEmployment.be  This test is not yet approved or cleared by the [place] FDA and has been authorized for detection and/or diagnosis of SARS-CoV-2 by FDA under an Emergency Use Authorization (EUA). This EUA will remain in effect (meaning this test can be used) for the duration of the COVID-19 declaration under Section 564(b)(1) of the Act, 21 U.S.C. section 360bbb-3(b)(1), unless the authorization is terminated or revoked.  Performed at [facility], [place] [address], [place], [place] [address]   MRSA PCR Screening     Status: None   Collection Time: 03/05/21  6:28 AM   Specimen: Nasal Mucosa; Nasopharyngeal  Result Value Ref Range Status   MRSA by PCR NEGATIVE NEGATIVE Final    Comment:        The GeneXpert MRSA Assay (FDA approved for NASAL specimens only), is one component of a comprehensive MRSA colonization surveillance program. It is not intended to diagnose MRSA infection nor to guide or monitor treatment for MRSA infections. Performed at [facility], [place] [address], [place], [place] [address]     RADIOLOGY STUDIES/RESULTS: DG Chest Port 1V same Day  Result Date: 03/07/2021 CLINICAL DATA:  Shortness of breath EXAM: PORTABLE CHEST 1 VIEW COMPARISON:  March 04, 2021 FINDINGS: Interstitial thickening is stable. No edema or airspace opacity. The heart size is normal. Pulmonary vascularity is within normal limits and stable. No adenopathy. No bone lesions. IMPRESSION: Interstitial  thickening, likely due to a degree of underlying chronic bronchitis. No edema or consolidation. Stable cardiac silhouette. Electronically Signed   By: Lowella Grip III M.D.   On: 03/07/2021 09:30      LOS: 4 days   Oren Binet, MD  [facility]    To contact the attending provider between 7A-7P or the covering provider during after hours 7P-7A, please log into the web site [email or web address] and access using universal [place] password for that web site. If you do not have the password, please call the hospital operator.  03/08/2021, 1:34 PM

## 2021-03-09 ENCOUNTER — Inpatient Hospital Stay (HOSPITAL_COMMUNITY): Payer: Medicare Other

## 2021-03-09 DIAGNOSIS — R0603 Acute respiratory distress: Secondary | ICD-10-CM

## 2021-03-09 LAB — COMPREHENSIVE METABOLIC PANEL WITH GFR
ALT: 77 U/L — ABNORMAL HIGH (ref 0–44)
AST: 51 U/L — ABNORMAL HIGH (ref 15–41)
Albumin: 2.8 g/dL — ABNORMAL LOW (ref 3.5–5.0)
Alkaline Phosphatase: 43 U/L (ref 38–126)
Anion gap: 4 — ABNORMAL LOW (ref 5–15)
BUN: 31 mg/dL — ABNORMAL HIGH (ref 8–23)
CO2: 27 mmol/L (ref 22–32)
Calcium: 8.6 mg/dL — ABNORMAL LOW (ref 8.9–10.3)
Chloride: 106 mmol/L (ref 98–111)
Creatinine, Ser: 0.91 mg/dL (ref 0.61–1.24)
GFR, Estimated: >60 mL/min
Glucose, Bld: 129 mg/dL — ABNORMAL HIGH (ref 70–99)
Potassium: 3.9 mmol/L (ref 3.5–5.1)
Sodium: 137 mmol/L (ref 135–145)
Total Bilirubin: 0.6 mg/dL (ref 0.3–1.2)
Total Protein: 5.6 g/dL — ABNORMAL LOW (ref 6.5–8.1)

## 2021-03-09 LAB — CBC
HCT: 43.3 % (ref 39.0–52.0)
Hemoglobin: 14.6 g/dL (ref 13.0–17.0)
MCH: 30.9 pg (ref 26.0–34.0)
MCHC: 33.7 g/dL (ref 30.0–36.0)
MCV: 91.7 fL (ref 80.0–100.0)
Platelets: 223 10*3/uL (ref 150–400)
RBC: 4.72 MIL/uL (ref 4.22–5.81)
RDW: 13.3 % (ref 11.5–15.5)
WBC: 11.6 10*3/uL — ABNORMAL HIGH (ref 4.0–10.5)
nRBC: 0 % (ref 0.0–0.2)

## 2021-03-09 LAB — ECHOCARDIOGRAM COMPLETE
AV Mean grad: 2 mmHg
AV Peak grad: 4.7 mmHg
Ao pk vel: 1.08 m/s
Area-P 1/2: 5.31 cm2
Height: 71 in
Weight: 2880 oz

## 2021-03-09 MED ORDER — FUROSEMIDE 10 MG/ML IJ SOLN
20.0000 mg | Freq: Once | INTRAMUSCULAR | Status: AC
  Administered 2021-03-09: 20 mg via INTRAVENOUS
  Filled 2021-03-09: qty 2

## 2021-03-09 NOTE — Progress Notes (Signed)
Occupational Therapy Treatment Patient Details Name: JAIREN GOLDFARB MRN: [id number] DOB: [birth year] Today's Date: 03/09/2021    History of present illness 70yo male admitted 6/7 with c/o SOB and DOE after exposure to a Covid + family member 5 days ago. Covid negative. Hypoxic to 85% in ED and required 4LPM O2. Found to have acute hypoxic/hypercarbic respiratory failure with COPD exacerbation. PMH CAD, COPD, SOB, tobacco use   OT comments  Pt received in recliner, agreeable to OT session. Upon arrival pt's HR 122bpm, with pursed lip breathing able to reduce HR to 116bpm. Pt requesting to return to bed. He required minguard for functional mobility at RW level. HR up to 138bpm with ambulation around end of bed to return to supine. Maintained SpO2 >96% throughout session with exertion. Pt educated on energy conservation strategies with provided handout, he verbalized understanding but would benefit from further education. Pt will continue to benefit from skilled OT services to maximize safety and independence with ADL/IADL and functional mobility. Will continue to follow acutely and progress as tolerated.    Follow Up Recommendations  Home health OT    Equipment Recommendations  Other (comment) (rollator)    Recommendations for Other Services      Precautions / Restrictions Precautions Precautions: Other (comment) Precaution Comments: watch O2/BP/RR Restrictions Weight Bearing Restrictions: No       Mobility Bed Mobility Overal bed mobility: Needs Assistance Bed Mobility: Supine to Sit;Sit to Supine     Supine to sit: Min guard Sit to supine: Min guard   General bed mobility comments: min guard, assist with line management    Transfers Overall transfer level: Needs assistance Equipment used: Rolling walker (2 wheeled) Transfers: Sit to/from Stand Sit to Stand: Min guard         General transfer comment: min guard for safety, no physical assist given but did need cues for  hand placement/sequencing    Balance Overall balance assessment: Mild deficits observed, not formally tested                                         ADL either performed or assessed with clinical judgement   ADL Overall ADL's : Needs assistance/impaired                         Toilet Transfer: Min guard;RW   Toileting- Clothing Manipulation and Hygiene: Min guard;Sit to/from stand       Functional mobility during ADLs: Min guard;Rolling walker General ADL Comments: educated pt on energy conservation strategies with provided handout. Pt limited by tachy HR 120s at rest, up to 138bpm with ambulation around EOB to sit EOB;RR 30s-38briefly, continued to educate pt on pursed lip breathing and increasing self-awarness for safe engagement in ADL.     Vision       Perception     Praxis      Cognition Arousal/Alertness: Awake/alert Behavior During Therapy: WFL for tasks assessed/performed Overall Cognitive Status: Within Functional Limits for tasks assessed                                          Exercises Exercises: Other exercises Other Exercises Other Exercises: educated pt on belly breathing, importance of pursed lip breathing, and use of IS Other Exercises: began  education on energy conservation strategies   Shoulder Instructions       General Comments      Pertinent Vitals/ Pain       Pain Assessment: No/denies pain  Home Living                                          Prior Functioning/Environment              Frequency  Min 2X/week        Progress Toward Goals  OT Goals(current goals can now be found in the care plan section)     Acute Rehab OT Goals Patient Stated Goal: go home, be able to breathe better OT Goal Formulation: With patient Time For Goal Achievement: 03/21/21 Potential to Achieve Goals: Good ADL Goals Pt Will Transfer to Toilet: with modified  independence;ambulating Additional ADL Goal #1: Pt will demonstrate independence with 3 energy conservation strategies during ADL/IADL and functional mobility. Additional ADL Goal #2: Pt will complete OOB ADL for 57min independently initiating use of seated rest breaks as needed.  Plan      Co-evaluation                 AM-PAC OT "6 Clicks" Daily Activity     Outcome Measure   Help from another person eating meals?: None Help from another person taking care of personal grooming?: A Little Help from another person toileting, which includes using toliet, bedpan, or urinal?: A Little Help from another person bathing (including washing, rinsing, drying)?: A Little Help from another person to put on and taking off regular upper body clothing?: None Help from another person to put on and taking off regular lower body clothing?: A Little 6 Click Score: 20    End of Session Equipment Utilized During Treatment: Rolling walker;Oxygen  OT Visit Diagnosis: Other abnormalities of gait and mobility (R26.89);Muscle weakness (generalized) (M62.81)   Activity Tolerance Patient tolerated treatment well   Patient Left in bed;with call bell/phone within reach;with bed alarm set   Nurse Communication Mobility status        Time: 1325-1345 OT Time Calculation (min): 20 min  Charges: OT General Charges $OT Visit: 1 Visit OT Treatments $Self Care/Home Management : 8-22 mins  Helene Kelp OTR/L Acute Rehabilitation Services Office: [place] 03/09/2021, 2:26 PM

## 2021-03-09 NOTE — Progress Notes (Signed)
PROGRESS NOTE        PATIENT DETAILS Name: Mark Tate Age: 70 y.o. Sex: male Date of Birth: 05/22/1951 Admit Date: 03/04/2021 Admitting Physician Albertine Patricia, MD WTU:UEKCMKLK, L.Marlou Sa, MD  Brief Narrative: Patient is a 70 y.o. male with history of COPD, chronic systolic heart failure (EF improved on last echo) presented with 3-4-day history of cough/worsening shortness of breath-found to have acute hypoxic respiratory failure requiring BiPAP support due to COPD exacerbation.  Significant events: 6/7>> admit for COPD exacerbation-on BiPAP 6/8>> off BiPAP-transitioned to 5 L via nasal cannula  Significant studies: 01/01/2020>> Echo: EF 30-35% 04/22/2020>> Echo: EF 60-65% 03/04/2021>> CXR: No obvious pneumonia 03/07/21>> CXR: No obvious consolidation-chronic interstitial thickening.  Antimicrobial therapy: Rocephin: 6/7>> 6/11 Zithromax: 6/7>> 6/11  Microbiology data: 6/7>> COVID PCR: Negative  Procedures : None  Consults: None  DVT Prophylaxis : enoxaparin (LOVENOX) injection 40 mg Start: 03/04/21 2200   Subjective: Slowly improving-feels better this morning-had an episode of shortness of breath/anxiety spell last night.   Assessment/Plan: Acute hypoxic respiratory failure due to COPD exacerbation: Very comfortable this morning-still wheezing but moving air well-he continues to feel better compared to how he first came in.  He did not require BiPAP last night.  Continue tapering steroids-continue bronchodilators.  Has finished a course of empiric Rocephin/Zithromax.  We will give 1 dose of IV Lasix-to ensure negative balance.  Continue to encourage incentive spirometry/flutter valve-and out of bed to chest-mobilization with therapy services.  AKI: Likely hemodynamically mediated due to diuretics-resolved.  Hyponatremia: Likely due to diuretics-resolved.  History of chronic combined systolic/diastolic heart failure: Most recent echo with  improved EF-no evidence of volume overload-continue supportive care-IV Lasix 20 mg x 1-to ensure negative balance today.  History of nonobstructive CAD: No anginal symptoms-continue aspirin/statin/Entresto-given diffuse wheezing-hold Coreg for a few days.  Tobacco abuse: Counseled-continue transdermal nicotine.  Diet: Diet Order             Diet Heart Room service appropriate? Yes; Fluid consistency: Thin  Diet effective now                    Code Status: Full code   Family Communication: Spouse at bedside  Disposition Plan: Status is: Inpatient  Remains inpatient appropriate because:Inpatient level of care appropriate due to severity of illness   Dispo: The patient is from: Home              Anticipated d/c is to: Home              Patient currently is not medically stable to d/c.   Difficult to place patient No    Barriers to Discharge: COPD exacerbation-still with coarse rhonchi all over-still on requiring oxygen supplementation-not yet at baseline and not stable for discharge.    Antimicrobial agents: Anti-infectives (From admission, onward)    Start     Dose/Rate Route Frequency Ordered Stop   03/05/21 1200  cefTRIAXone (ROCEPHIN) 1 g in sodium chloride 0.9 % 100 mL IVPB        1 g 200 mL/hr over 30 Minutes Intravenous Every 24 hours 03/04/21 2040 03/10/21 1159   03/05/21 1200  azithromycin (ZITHROMAX) 500 mg in sodium chloride 0.9 % 250 mL IVPB        500 mg 250 mL/hr over 60 Minutes Intravenous Every 24 hours 03/04/21 1510 03/09/21 1332  03/04/21 1330  cefTRIAXone (ROCEPHIN) 1 g in sodium chloride 0.9 % 100 mL IVPB        1 g 200 mL/hr over 30 Minutes Intravenous  Once 03/04/21 1324 03/04/21 1437   03/04/21 1330  azithromycin (ZITHROMAX) 500 mg in sodium chloride 0.9 % 250 mL IVPB        500 mg 250 mL/hr over 60 Minutes Intravenous  Once 03/04/21 1324 03/04/21 1603        Time spent: 25 minutes-Greater than 50% of this time was spent in  counseling, explanation of diagnosis, planning of further management, and coordination of care.  MEDICATIONS: Scheduled Meds:  aspirin EC  81 mg Oral Daily   atorvastatin  40 mg Oral Daily   budesonide (PULMICORT) nebulizer solution  0.5 mg Nebulization BID   chlorhexidine  15 mL Mouth Rinse BID   enoxaparin (LOVENOX) injection  40 mg Subcutaneous Q24H   fluticasone  2 spray Each Nare Daily   guaiFENesin  1,200 mg Oral BID   ipratropium-albuterol  3 mL Nebulization Q4H   loratadine  10 mg Oral Daily   mouth rinse  15 mL Mouth Rinse q12n4p   methylPREDNISolone (SOLU-MEDROL) injection  40 mg Intravenous Q12H   nicotine  21 mg Transdermal Daily   omega-3 acid ethyl esters  1 g Oral BID   pantoprazole  40 mg Oral Q1200   polyethylene glycol  17 g Oral Daily   sacubitril-valsartan  1 tablet Oral BID   Continuous Infusions:  cefTRIAXone (ROCEPHIN)  IV Stopped (03/08/21 1336)   PRN Meds:.acetaminophen, albuterol, benzonatate, bisacodyl   PHYSICAL EXAM: Vital signs: Vitals:   03/09/21 1121 03/09/21 1212 03/09/21 1234 03/09/21 1420  BP:  (!) 135/98 (!) 133/95 (!) 141/96  Pulse:  (!) 124 (!) 118 (!) 117  Resp:  19 20 (!) 25  Temp:  98.2 F (36.8 C) 98.5 F (36.9 C) 97.8 F (36.6 C)  TempSrc:  Oral Oral Oral  SpO2: 98% 97% 98% 97%  Weight:      Height:       Filed Weights   03/04/21 1758  Weight: 81.6 kg   Body mass index is 25.1 kg/m.   Gen Exam:Alert awake-not in any distress HEENT:atraumatic, normocephalic Chest: Moving air well-+ expiratory rhonchi all over. CVS:S1S2 regular Abdomen:soft non tender, non distended Extremities:no edema Neurology: Non focal Skin: no rash   I have personally reviewed following labs and imaging studies  LABORATORY DATA: CBC: Recent Labs  Lab 03/04/21 1025 03/04/21 1320 03/05/21 0050 03/06/21 0231 03/07/21 0323 03/08/21 0752 03/09/21 0040  WBC 11.2*  --  11.5* 16.9* 14.5* 11.3* 11.6*  NEUTROABS 8.0*  --   --   --   --   --    --   HGB 16.1   < > 15.6 14.3 14.6 14.3 14.6  HCT 47.5   < > 44.8 42.0 41.8 41.9 43.3  MCV 93.5  --  92.0 92.3 91.3 91.9 91.7  PLT 186  --  168 182 201 200 223   < > = values in this interval not displayed.     Basic Metabolic Panel: Recent Labs  Lab 03/05/21 0050 03/06/21 0231 03/07/21 0323 03/08/21 0752 03/09/21 0040  NA 132* 129* 134* 136 137  K 4.1 4.2 4.4 4.3 3.9  CL 99 97* 102 104 106  CO2 25 24 25 24 27   GLUCOSE 161* 154* 153* 140* 129*  BUN 26* 46* 44* 30* 31*  CREATININE 1.20 1.42* 1.13 0.98 0.91  CALCIUM 8.6* 8.5* 8.7* 8.5* 8.6*     GFR: Estimated Creatinine Clearance: 78.2 mL/min (by C-G formula based on SCr of 0.91 mg/dL).  Liver Function Tests: Recent Labs  Lab 03/04/21 1323 03/08/21 0752 03/09/21 0040  AST 45* 45* 51*  ALT 21 61* 77*  ALKPHOS 62 39 43  BILITOT 1.1 0.7 0.6  PROT 6.9 5.3* 5.6*  ALBUMIN 3.6 2.7* 2.8*    No results for input(s): LIPASE, AMYLASE in the last 168 hours. No results for input(s): AMMONIA in the last 168 hours.  Coagulation Profile: No results for input(s): INR, PROTIME in the last 168 hours.  Cardiac Enzymes: No results for input(s): CKTOTAL, CKMB, CKMBINDEX, TROPONINI in the last 168 hours.  BNP (last 3 results) No results for input(s): PROBNP in the last 8760 hours.  Lipid Profile: No results for input(s): CHOL, HDL, LDLCALC, TRIG, CHOLHDL, LDLDIRECT in the last 72 hours.  Thyroid Function Tests: No results for input(s): TSH, T4TOTAL, FREET4, T3FREE, THYROIDAB in the last 72 hours.  Anemia Panel: No results for input(s): VITAMINB12, FOLATE, FERRITIN, TIBC, IRON, RETICCTPCT in the last 72 hours.  Urine analysis:    Component Value Date/Time   COLORURINE AMBER (A) 03/04/2021 1626   APPEARANCEUR HAZY (A) 03/04/2021 1626   LABSPEC 1.024 03/04/2021 1626   PHURINE 5.0 03/04/2021 1626   GLUCOSEU NEGATIVE 03/04/2021 1626   HGBUR NEGATIVE 03/04/2021 1626   BILIRUBINUR NEGATIVE 03/04/2021 1626   KETONESUR 5  (A) 03/04/2021 1626   PROTEINUR 30 (A) 03/04/2021 1626   NITRITE NEGATIVE 03/04/2021 1626   LEUKOCYTESUR NEGATIVE 03/04/2021 1626    Sepsis Labs: Lactic Acid, Venous    Component Value Date/Time   LATICACIDVEN 1.9 03/04/2021 1626    MICROBIOLOGY: Recent Results (from the past 240 hour(s))  Resp Panel by RT-PCR (Flu A&B, Covid) Nasopharyngeal Swab     Status: None   Collection Time: 03/04/21 10:13 AM   Specimen: Nasopharyngeal Swab; Nasopharyngeal(NP) swabs in vial transport medium  Result Value Ref Range Status   SARS Coronavirus 2 by RT PCR NEGATIVE NEGATIVE Final    Comment: (NOTE) SARS-CoV-2 target nucleic acids are NOT DETECTED.  The SARS-CoV-2 RNA is generally detectable in upper respiratory specimens during the acute phase of infection. The lowest concentration of SARS-CoV-2 viral copies this assay can detect is 138 copies/mL. A negative result does not preclude SARS-Cov-2 infection and should not be used as the sole basis for treatment or other patient management decisions. A negative result may occur with  improper specimen collection/handling, submission of specimen other than nasopharyngeal swab, presence of viral mutation(s) within the areas targeted by this assay, and inadequate number of viral copies(<138 copies/mL). A negative result must be combined with clinical observations, patient history, and epidemiological information. The expected result is Negative.  Fact Sheet for Patients:  EntrepreneurPulse.com.au  Fact Sheet for Healthcare Providers:  IncredibleEmployment.be  This test is no t yet approved or cleared by the [place] FDA and  has been authorized for detection and/or diagnosis of SARS-CoV-2 by FDA under an Emergency Use Authorization (EUA). This EUA will remain  in effect (meaning this test can be used) for the duration of the COVID-19 declaration under Section 564(b)(1) of the Act, 21 U.S.C.section  360bbb-3(b)(1), unless the authorization is terminated  or revoked sooner.       Influenza A by PCR NEGATIVE NEGATIVE Final   Influenza B by PCR NEGATIVE NEGATIVE Final    Comment: (NOTE) The Xpert Xpress SARS-CoV-2/FLU/RSV plus assay is intended as an  aid in the diagnosis of influenza from Nasopharyngeal swab specimens and should not be used as a sole basis for treatment. Nasal washings and aspirates are unacceptable for Xpert Xpress SARS-CoV-2/FLU/RSV testing.  Fact Sheet for Patients: EntrepreneurPulse.com.au  Fact Sheet for Healthcare Providers: IncredibleEmployment.be  This test is not yet approved or cleared by the [place] FDA and has been authorized for detection and/or diagnosis of SARS-CoV-2 by FDA under an Emergency Use Authorization (EUA). This EUA will remain in effect (meaning this test can be used) for the duration of the COVID-19 declaration under Section 564(b)(1) of the Act, 21 U.S.C. section 360bbb-3(b)(1), unless the authorization is terminated or revoked.  Performed at [facility], [place] [address] [place], [place] [address]   MRSA PCR Screening     Status: None   Collection Time: 03/05/21  6:28 AM   Specimen: Nasal Mucosa; Nasopharyngeal  Result Value Ref Range Status   MRSA by PCR NEGATIVE NEGATIVE Final    Comment:        The GeneXpert MRSA Assay (FDA approved for NASAL specimens only), is one component of a comprehensive MRSA colonization surveillance program. It is not intended to diagnose MRSA infection nor to guide or monitor treatment for MRSA infections. Performed at [facility], [place] [address] [place], [place] [address]     RADIOLOGY STUDIES/RESULTS: DG CHEST PORT 1 VIEW  Result Date: 03/08/2021 CLINICAL DATA:  Shortness of breath EXAM: PORTABLE CHEST 1 VIEW COMPARISON:  03/07/2021 FINDINGS: There is hyperinflation of the lungs compatible with COPD. Heart and mediastinal contours  are within normal limits. No focal opacities or effusions. No acute bony abnormality. IMPRESSION: COPD.  No active disease. Electronically Signed   By: Rolm Baptise M.D.   On: 03/08/2021 21:40      LOS: 5 days   Oren Binet, MD  [facility]    To contact the attending provider between 7A-7P or the covering provider during after hours 7P-7A, please log into the web site [email or web address] and access using universal [place] password for that web site. If you do not have the password, please call the hospital operator.  03/09/2021, 2:28 PM

## 2021-03-09 NOTE — Progress Notes (Signed)
   03/09/21 1234  Assess: MEWS Score  Temp 98.5 F (36.9 C)  BP (!) 133/95  Pulse Rate (!) 118  ECG Heart Rate (!) 117  Resp 20  SpO2 98 %  Assess: MEWS Score  MEWS Temp 0  MEWS Systolic 0  MEWS Pulse 2  MEWS RR 0  MEWS LOC 0  MEWS Score 2  MEWS Score Color Yellow  Assess: if the MEWS score is Yellow or Red  Were vital signs taken at a resting state? Yes  Focused Assessment No change from prior assessment  Early Detection of Sepsis Score *See Row Information* Low  MEWS guidelines implemented *See Row Information* Yes  Treat  MEWS Interventions Escalated (See documentation below)  Take Vital Signs  Increase Vital Sign Frequency  Yellow: Q 2hr X 2 then Q 4hr X 2, if remains yellow, continue Q 4hrs  Escalate  MEWS: Escalate Yellow: discuss with charge nurse/RN and consider discussing with provider and RRT  Notify: Charge Nurse/RN  Name of Charge Nurse/RN Notified sarah  Date Charge Nurse/RN Notified 03/09/21  Time Charge Nurse/RN Notified 1240

## 2021-03-10 LAB — COMPREHENSIVE METABOLIC PANEL
ALT: 68 U/L — ABNORMAL HIGH (ref 0–44)
AST: 36 U/L (ref 15–41)
Albumin: 2.6 g/dL — ABNORMAL LOW (ref 3.5–5.0)
Alkaline Phosphatase: 39 U/L (ref 38–126)
Anion gap: 4 — ABNORMAL LOW (ref 5–15)
BUN: 24 mg/dL — ABNORMAL HIGH (ref 8–23)
CO2: 30 mmol/L (ref 22–32)
Calcium: 8.4 mg/dL — ABNORMAL LOW (ref 8.9–10.3)
Chloride: 104 mmol/L (ref 98–111)
Creatinine, Ser: 0.98 mg/dL (ref 0.61–1.24)
GFR, Estimated: >60 mL/min (ref >60)
Glucose, Bld: 128 mg/dL — ABNORMAL HIGH (ref 70–99)
Potassium: 4.2 mmol/L (ref 3.5–5.1)
Sodium: 138 mmol/L (ref 135–145)
Total Bilirubin: 0.9 mg/dL (ref 0.3–1.2)
Total Protein: 5.2 g/dL — ABNORMAL LOW (ref 6.5–8.1)

## 2021-03-10 LAB — CBC
HCT: 42.4 % (ref 39.0–52.0)
Hemoglobin: 14.5 g/dL (ref 13.0–17.0)
MCH: 31.2 pg (ref 26.0–34.0)
MCHC: 34.2 g/dL (ref 30.0–36.0)
MCV: 91.2 fL (ref 80.0–100.0)
Platelets: 202 10*3/uL (ref 150–400)
RBC: 4.65 MIL/uL (ref 4.22–5.81)
RDW: 13.4 % (ref 11.5–15.5)
WBC: 10.1 10*3/uL (ref 4.0–10.5)
nRBC: 0 % (ref 0.0–0.2)

## 2021-03-10 MED ORDER — FUROSEMIDE 10 MG/ML IJ SOLN
20.0000 mg | Freq: Once | INTRAMUSCULAR | Status: AC
  Administered 2021-03-10: 20 mg via INTRAVENOUS
  Filled 2021-03-10: qty 2

## 2021-03-10 NOTE — Progress Notes (Signed)
Physical Therapy Treatment Patient Details Name: Mark Tate MRN: [id number] DOB: Feb 10, 1951 Today's Date: 03/10/2021    History of Present Illness 70yo male admitted 6/7 with c/o SOB and DOE after exposure to a Covid + family member 5 days ago. Covid negative. Hypoxic to 85% in ED and required 4LPM O2. Found to have acute hypoxic/hypercarbic respiratory failure with COPD exacerbation. PMH CAD, COPD, SOB, tobacco use    PT Comments    Pt making steady progress towards his physical therapy goals, exhibiting improved activity tolerance and ambulation distance this session. Pt ambulating x 120 feet with a walker; SpO2 88-91% on 2L O2. Pt did have two episodes of posterior loss of balance, requiring min assist to correct. Would benefit from HHPT to address cardiopulmonary endurance, strengthening and balance.     Follow Up Recommendations  Home health PT     Equipment Recommendations   (Rollator)    Recommendations for Other Services       Precautions / Restrictions Precautions Precautions: Other (comment);Fall Precaution Comments: watch O2 Restrictions Weight Bearing Restrictions: No    Mobility  Bed Mobility Overal bed mobility: Needs Assistance Bed Mobility: Supine to Sit     Supine to sit: Min assist;HOB elevated     General bed mobility comments: OOB in chair    Transfers Overall transfer level: Needs assistance Equipment used: Rolling walker (2 wheeled) Transfers: Sit to/from Stand Sit to Stand: Min guard         General transfer comment: Cues for hand placement  Ambulation/Gait Ambulation/Gait assistance: Min assist Gait Distance (Feet): 120 Feet Assistive device: Rolling walker (2 wheeled) Gait Pattern/deviations: Step-through pattern;Decreased stride length Gait velocity: decreased   General Gait Details: Cues for activity pacing and pursed lip breathing, pt with x 2 posterior loss of balance requiring minA to correct   Stairs              Wheelchair Mobility    Modified Rankin (Stroke Patients Only)       Balance Overall balance assessment: Needs assistance Sitting-balance support: No upper extremity supported;Feet supported Sitting balance-Leahy Scale: Good     Standing balance support: Bilateral upper extremity supported Standing balance-Leahy Scale: Poor Standing balance comment: reliant on BUE support                            Cognition Arousal/Alertness: Awake/alert Behavior During Therapy: WFL for tasks assessed/performed Overall Cognitive Status: Within Functional Limits for tasks assessed                                 General Comments: can be verbose, pt tends to bicker with wife with both wife and pt needing to be redirected      Exercises General Exercises - Lower Extremity Long Arc Quad: Both;10 reps;Seated Hip Flexion/Marching: Both;10 reps;Seated Heel Raises: Both;20 reps;Seated Other Exercises Other Exercises: pt reports he had just completed IS; reinforced education on completing IS every hour x10 reps as well as using flutter valve every hour x10 reps Other Exercises: encouraged pt to complete LAQ and seated marches while in chair as well as using BUEs on arm rests of chair to shift trunk forward as well as working on lateral reaching and trunk rotation from chair    General Comments General comments (skin integrity, edema, etc.): pt on 2L [place] during session with SpO2 >89%, HR max 113 bpm. education on  pursed lip breathing technique with good carrover of education      Pertinent Vitals/Pain Pain Assessment: No/denies pain    Home Living                      Prior Function            PT Goals (current goals can now be found in the care plan section) Acute Rehab PT Goals Patient Stated Goal: to go home Potential to Achieve Goals: Good Progress towards PT goals: Progressing toward goals    Frequency    Min 3X/week      PT Plan  Current plan remains appropriate    Co-evaluation              AM-PAC PT "6 Clicks" Mobility   Outcome Measure  Help needed turning from your back to your side while in a flat bed without using bedrails?: None Help needed moving from lying on your back to sitting on the side of a flat bed without using bedrails?: None Help needed moving to and from a bed to a chair (including a wheelchair)?: A Little Help needed standing up from a chair using your arms (e.g., wheelchair or bedside chair)?: A Little Help needed to walk in hospital room?: A Little Help needed climbing 3-5 steps with a railing? : A Little 6 Click Score: 20    End of Session Equipment Utilized During Treatment: Gait belt;Oxygen Activity Tolerance: Patient tolerated treatment well Patient left: with call bell/phone within reach;with family/visitor present;in chair Nurse Communication: Mobility status PT Visit Diagnosis: Muscle weakness (generalized) (M62.81);Difficulty in walking, not elsewhere classified (R26.2)     Time: 6579-0383 PT Time Calculation (min) (ACUTE ONLY): 24 min  Charges:  $Therapeutic Activity: 23-37 mins                     Wyona Almas, PT, DPT Acute Rehabilitation Services Pager [phone] Office [phone]    Deno Etienne 03/10/2021, 5:05 PM

## 2021-03-10 NOTE — Progress Notes (Signed)
   03/10/21 1211  Assess: MEWS Score  Temp 98.5 F (36.9 C)  BP 120/89  Pulse Rate (!) 107  ECG Heart Rate (!) 106  Resp (!) 28  SpO2 90 %  O2 Device Nasal Cannula  Assess: MEWS Score  MEWS Temp 0  MEWS Systolic 0  MEWS Pulse 1  MEWS RR 2  MEWS LOC 0  MEWS Score 3  MEWS Score Color Yellow  Assess: if the MEWS score is Yellow or Red  Were vital signs taken at a resting state? Yes  Focused Assessment No change from prior assessment  Early Detection of Sepsis Score *See Row Information* Low  MEWS guidelines implemented *See Row Information* Yes  Treat  MEWS Interventions Escalated (See documentation below)  Take Vital Signs  Increase Vital Sign Frequency  Yellow: Q 2hr X 2 then Q 4hr X 2, if remains yellow, continue Q 4hrs  Escalate  MEWS: Escalate Yellow: discuss with charge nurse/RN and consider discussing with provider and RRT  Notify: Charge Nurse/RN  Name of Charge Nurse/RN Notified erin  Date Charge Nurse/RN Notified 03/10/21  Time Charge Nurse/RN Notified 4401  Document  Progress note created (see row info) Yes

## 2021-03-10 NOTE — Progress Notes (Signed)
Occupational Therapy Treatment Patient Details Name: Mark Tate MRN: [id number] DOB: 09/12/1951 Today's Date: 03/10/2021    History of present illness 70yo male admitted 6/7 with c/o SOB and DOE after exposure to a Covid + family member 5 days ago. Covid negative. Hypoxic to 85% in ED and required 4LPM O2. Found to have acute hypoxic/hypercarbic respiratory failure with COPD exacerbation. PMH CAD, COPD, SOB, tobacco use   OT comments  Pt making steady progress towards OT goals this session. Session focus on functional mobility as precursor to higher level BADLs and education on  pursed lip breathing (PLB) techniques during mobility. Pt continues to present with generalized deconditioning, impaired activity tolerance and decreased ability to care for self. Pt currently requires min guard assist for functional mobility within pts room ~ 30 ft with RW. Pt on 2L [place] during session with SpO2 >89%,education provided on incorporating PLB technique into mobility with good carryover. Pt currently requires set- up assist for UB ADLs and MAX A for LB ADLs. Pt would continue to benefit from skilled occupational therapy while admitted and after d/c to address the below listed limitations in order to improve overall functional mobility and facilitate independence with BADL participation. DC plan remains appropriate, will follow acutely per POC.     Follow Up Recommendations  Home health OT    Equipment Recommendations  Other (comment) (rollator)    Recommendations for Other Services      Precautions / Restrictions Precautions Precautions: Other (comment) Precaution Comments: watch O2/BP/RR Restrictions Weight Bearing Restrictions: No       Mobility Bed Mobility Overal bed mobility: Needs Assistance Bed Mobility: Supine to Sit     Supine to sit: Min assist;HOB elevated     General bed mobility comments: pt reaching out to wife to assist him with elevating his trunk into sitting even though pt  provided education on using bed rail rather than pulling on wife    Transfers Overall transfer level: Needs assistance Equipment used: Rolling walker (2 wheeled) Transfers: Sit to/from Stand Sit to Stand: Min guard         General transfer comment: min guard to rise from EOB, cues for hand placement and initial light steadying assist upon initial stand for safety    Balance Overall balance assessment: Needs assistance Sitting-balance support: No upper extremity supported;Feet supported Sitting balance-Leahy Scale: Fair     Standing balance support: Bilateral upper extremity supported Standing balance-Leahy Scale: Poor Standing balance comment: reliant on BUE support                           ADL either performed or assessed with clinical judgement   ADL Overall ADL's : Needs assistance/impaired                 Upper Body Dressing : Set up;Sitting Upper Body Dressing Details (indicate cue type and reason): to don posterior gown Lower Body Dressing: Maximal assistance;Bed level Lower Body Dressing Details (indicate cue type and reason): pts wife putting on his socks for him, pt reports " i can't do it right now" Toilet Transfer: Min Marine scientist Details (indicate cue type and reason): simulated via functional mobility with Rw         Functional mobility during ADLs: Min guard;Rolling walker General ADL Comments: pt continues to present with impaired activity tolerance, increased WOB with minimal exertion and generalized deconditioning     Vision       Perception  Praxis      Cognition Arousal/Alertness: Awake/alert Behavior During Therapy: WFL for tasks assessed/performed Overall Cognitive Status: Within Functional Limits for tasks assessed                                 General Comments: can be verbose, pt tends to bicker with wife with both wife and pt needing to be redirected        Exercises  Other Exercises Other Exercises: pt reports he had just completed IS; reinforced education on completing IS every hour x10 reps as well as using flutter valve every hour x10 reps Other Exercises: encouraged pt to complete LAQ and seated marches while in chair as well as using BUEs on arm rests of chair to shift trunk forward as well as working on lateral reaching and trunk rotation from chair   Shoulder Instructions       General Comments pt on 2L [place] during session with SpO2 >89%, HR max 113 bpm. education on pursed lip breathing technique with good carrover of education    Pertinent Vitals/ Pain       Pain Assessment: No/denies pain  Home Living                                          Prior Functioning/Environment              Frequency  Min 2X/week        Progress Toward Goals  OT Goals(current goals can now be found in the care plan section)  Progress towards OT goals: Progressing toward goals  Acute Rehab OT Goals Patient Stated Goal: to go home OT Goal Formulation: With patient Time For Goal Achievement: 03/21/21 Potential to Achieve Goals: Good  Plan Discharge plan remains appropriate;Frequency remains appropriate    Co-evaluation                 AM-PAC OT "6 Clicks" Daily Activity     Outcome Measure   Help from another person eating meals?: None Help from another person taking care of personal grooming?: A Little Help from another person toileting, which includes using toliet, bedpan, or urinal?: A Little Help from another person bathing (including washing, rinsing, drying)?: A Little Help from another person to put on and taking off regular upper body clothing?: None Help from another person to put on and taking off regular lower body clothing?: A Little 6 Click Score: 20    End of Session Equipment Utilized During Treatment: Gait belt;Rolling walker;Oxygen;Other (comment) (2L [place])  OT Visit Diagnosis: Other abnormalities of  gait and mobility (R26.89);Muscle weakness (generalized) (M62.81)   Activity Tolerance Patient tolerated treatment well   Patient Left in chair;with call bell/phone within reach   Nurse Communication Mobility status        Time: 0102-7253 OT Time Calculation (min): 27 min  Charges: OT General Charges $OT Visit: 1 Visit OT Treatments $Self Care/Home Management : 8-22 mins $Therapeutic Activity: 8-22 mins  Harley Alto., COTA/L Acute Rehabilitation Services [phone]   Precious Haws 03/10/2021, 1:19 PM

## 2021-03-10 NOTE — Progress Notes (Signed)
Patient is not in need of BIPAP at this time.  Patient is aware that if he gets SOB he may need to have it placed on for night rest.

## 2021-03-10 NOTE — Progress Notes (Signed)
PROGRESS NOTE        PATIENT DETAILS Name: Mark Tate Age: 70 y.o. Sex: male Date of Birth: Dec 07, 1950 Admit Date: 03/04/2021 Admitting Physician Albertine Patricia, MD QMG:NOIBBCWU, L.Marlou Sa, MD  Brief Narrative: Patient is a 70 y.o. male with history of COPD, chronic systolic heart failure (EF improved on last echo) presented with 3-4-day history of cough/worsening shortness of breath-found to have acute hypoxic respiratory failure requiring BiPAP support due to COPD exacerbation.  Significant events: 6/7>> admit for COPD exacerbation-on BiPAP 6/8>> off BiPAP-transitioned to 5 L via nasal cannula  Significant studies: 01/01/2020>> Echo: EF 30-35% 04/22/2020>> Echo: EF 60-65% 03/04/2021>> CXR: No obvious pneumonia 03/07/21>> CXR: No obvious consolidation-chronic interstitial thickening.  Antimicrobial therapy: Rocephin: 6/7>> 6/11 Zithromax: 6/7>> 6/11  Microbiology data: 6/7>> COVID PCR: Negative  Procedures : None  Consults: None  DVT Prophylaxis : enoxaparin (LOVENOX) injection 40 mg Start: 03/04/21 2200   Subjective: Slowly improving-he feels that he is slowly approaching his baseline.  He wants to ambulate today to see how he does with therapy services.   Assessment/Plan: Acute hypoxic respiratory failure due to COPD exacerbation: Slowly improving still wheezing but overall much better than the past few days.  Continue tapering steroids and bronchodilators.  Has completed a course of empiric Rocephin/Zithromax.  Repeat 1 dose of IV Lasix to ensure negative balance.  Continue to encourage incentive spirometry use.  Mobilize today-if clinical improvement continues-suspect home in the next 1-2 days  AKI: Likely hemodynamically mediated due to diuretics-resolved.  Hyponatremia: Likely due to diuretics-resolved.  History of chronic combined systolic/diastolic heart failure: Most recent echo showed improved EF-no evidence of volume  overload-repeating 1 dose of IV Lasix to ensure negative balance.  History of nonobstructive CAD: No anginal symptoms-continue aspirin/statin/Entresto-given diffuse wheezing-hold Coreg for a few days.  Tobacco abuse: Counseled-continue transdermal nicotine.  Diet: Diet Order             Diet Heart Room service appropriate? Yes; Fluid consistency: Thin  Diet effective now                    Code Status: Full code   Family Communication: Spouse at bedside  Disposition Plan: Status is: Inpatient  Remains inpatient appropriate because:Inpatient level of care appropriate due to severity of illness   Dispo: The patient is from: Home              Anticipated d/c is to: Home              Patient currently is not medically stable to d/c.   Difficult to place patient No    Barriers to Discharge: COPD exacerbation-still with coarse rhonchi all over-still on requiring oxygen supplementation-not yet at baseline and not stable for discharge.    Antimicrobial agents: Anti-infectives (From admission, onward)    Start     Dose/Rate Route Frequency Ordered Stop   03/05/21 1200  cefTRIAXone (ROCEPHIN) 1 g in sodium chloride 0.9 % 100 mL IVPB        1 g 200 mL/hr over 30 Minutes Intravenous Every 24 hours 03/04/21 2040 03/09/21 1550   03/05/21 1200  azithromycin (ZITHROMAX) 500 mg in sodium chloride 0.9 % 250 mL IVPB        500 mg 250 mL/hr over 60 Minutes Intravenous Every 24 hours 03/04/21 1510 03/09/21 1332   03/04/21 1330  cefTRIAXone (ROCEPHIN) 1 g in sodium chloride 0.9 % 100 mL IVPB        1 g 200 mL/hr over 30 Minutes Intravenous  Once 03/04/21 1324 03/04/21 1437   03/04/21 1330  azithromycin (ZITHROMAX) 500 mg in sodium chloride 0.9 % 250 mL IVPB        500 mg 250 mL/hr over 60 Minutes Intravenous  Once 03/04/21 1324 03/04/21 1603        Time spent: 25 minutes-Greater than 50% of this time was spent in counseling, explanation of diagnosis, planning of further  management, and coordination of care.  MEDICATIONS: Scheduled Meds:  aspirin EC  81 mg Oral Daily   atorvastatin  40 mg Oral Daily   budesonide (PULMICORT) nebulizer solution  0.5 mg Nebulization BID   chlorhexidine  15 mL Mouth Rinse BID   enoxaparin (LOVENOX) injection  40 mg Subcutaneous Q24H   fluticasone  2 spray Each Nare Daily   guaiFENesin  1,200 mg Oral BID   ipratropium-albuterol  3 mL Nebulization Q4H   loratadine  10 mg Oral Daily   mouth rinse  15 mL Mouth Rinse q12n4p   methylPREDNISolone (SOLU-MEDROL) injection  40 mg Intravenous Q12H   nicotine  21 mg Transdermal Daily   omega-3 acid ethyl esters  1 g Oral BID   pantoprazole  40 mg Oral Q1200   polyethylene glycol  17 g Oral Daily   sacubitril-valsartan  1 tablet Oral BID   Continuous Infusions:   PRN Meds:.acetaminophen, albuterol, benzonatate, bisacodyl   PHYSICAL EXAM: Vital signs: Vitals:   03/10/21 0754 03/10/21 0755 03/10/21 1211 03/10/21 1255  BP: (!) 146/83  120/89 120/90  Pulse: 77  (!) 107 (!) 102  Resp: 20  (!) 28 14  Temp: 98.1 F (36.7 C) 98.1 F (36.7 C) 98.5 F (36.9 C) 98.5 F (36.9 C)  TempSrc: Oral Oral  Oral  SpO2: (!) 88%  90%   Weight:      Height:       Filed Weights   03/04/21 1758  Weight: 81.6 kg   Body mass index is 25.1 kg/m.   Gen Exam:Alert awake-not in any distress HEENT:atraumatic, normocephalic Chest:+ Rhonchi-all over-moving air well bilaterally. CVS:S1S2 regular Abdomen:soft non tender, non distended Extremities:no edema Neurology: Non focal Skin: no rash   I have personally reviewed following labs and imaging studies  LABORATORY DATA: CBC: Recent Labs  Lab 03/04/21 1025 03/04/21 1320 03/06/21 0231 03/07/21 0323 03/08/21 0752 03/09/21 0040 03/10/21 0330  WBC 11.2*   < > 16.9* 14.5* 11.3* 11.6* 10.1  NEUTROABS 8.0*  --   --   --   --   --   --   HGB 16.1   < > 14.3 14.6 14.3 14.6 14.5  HCT 47.5   < > 42.0 41.8 41.9 43.3 42.4  MCV 93.5   < >  92.3 91.3 91.9 91.7 91.2  PLT 186   < > 182 201 200 223 202   < > = values in this interval not displayed.     Basic Metabolic Panel: Recent Labs  Lab 03/06/21 0231 03/07/21 0323 03/08/21 0752 03/09/21 0040 03/10/21 0330  NA 129* 134* 136 137 138  K 4.2 4.4 4.3 3.9 4.2  CL 97* 102 104 106 104  CO2 24 25 24 27 30   GLUCOSE 154* 153* 140* 129* 128*  BUN 46* 44* 30* 31* 24*  CREATININE 1.42* 1.13 0.98 0.91 0.98  CALCIUM 8.5* 8.7* 8.5* 8.6* 8.4*  GFR: Estimated Creatinine Clearance: 72.6 mL/min (by C-G formula based on SCr of 0.98 mg/dL).  Liver Function Tests: Recent Labs  Lab 03/04/21 1323 03/08/21 0752 03/09/21 0040 03/10/21 0330  AST 45* 45* 51* 36  ALT 21 61* 77* 68*  ALKPHOS 62 39 43 39  BILITOT 1.1 0.7 0.6 0.9  PROT 6.9 5.3* 5.6* 5.2*  ALBUMIN 3.6 2.7* 2.8* 2.6*    No results for input(s): LIPASE, AMYLASE in the last 168 hours. No results for input(s): AMMONIA in the last 168 hours.  Coagulation Profile: No results for input(s): INR, PROTIME in the last 168 hours.  Cardiac Enzymes: No results for input(s): CKTOTAL, CKMB, CKMBINDEX, TROPONINI in the last 168 hours.  BNP (last 3 results) No results for input(s): PROBNP in the last 8760 hours.  Lipid Profile: No results for input(s): CHOL, HDL, LDLCALC, TRIG, CHOLHDL, LDLDIRECT in the last 72 hours.  Thyroid Function Tests: No results for input(s): TSH, T4TOTAL, FREET4, T3FREE, THYROIDAB in the last 72 hours.  Anemia Panel: No results for input(s): VITAMINB12, FOLATE, FERRITIN, TIBC, IRON, RETICCTPCT in the last 72 hours.  Urine analysis:    Component Value Date/Time   COLORURINE AMBER (A) 03/04/2021 1626   APPEARANCEUR HAZY (A) 03/04/2021 1626   LABSPEC 1.024 03/04/2021 1626   PHURINE 5.0 03/04/2021 1626   GLUCOSEU NEGATIVE 03/04/2021 1626   HGBUR NEGATIVE 03/04/2021 1626   BILIRUBINUR NEGATIVE 03/04/2021 1626   KETONESUR 5 (A) 03/04/2021 1626   PROTEINUR 30 (A) 03/04/2021 1626   NITRITE  NEGATIVE 03/04/2021 1626   LEUKOCYTESUR NEGATIVE 03/04/2021 1626    Sepsis Labs: Lactic Acid, Venous    Component Value Date/Time   LATICACIDVEN 1.9 03/04/2021 1626    MICROBIOLOGY: Recent Results (from the past 240 hour(s))  Resp Panel by RT-PCR (Flu A&B, Covid) Nasopharyngeal Swab     Status: None   Collection Time: 03/04/21 10:13 AM   Specimen: Nasopharyngeal Swab; Nasopharyngeal(NP) swabs in vial transport medium  Result Value Ref Range Status   SARS Coronavirus 2 by RT PCR NEGATIVE NEGATIVE Final    Comment: (NOTE) SARS-CoV-2 target nucleic acids are NOT DETECTED.  The SARS-CoV-2 RNA is generally detectable in upper respiratory specimens during the acute phase of infection. The lowest concentration of SARS-CoV-2 viral copies this assay can detect is 138 copies/mL. A negative result does not preclude SARS-Cov-2 infection and should not be used as the sole basis for treatment or other patient management decisions. A negative result may occur with  improper specimen collection/handling, submission of specimen other than nasopharyngeal swab, presence of viral mutation(s) within the areas targeted by this assay, and inadequate number of viral copies(<138 copies/mL). A negative result must be combined with clinical observations, patient history, and epidemiological information. The expected result is Negative.  Fact Sheet for Patients:  EntrepreneurPulse.com.au  Fact Sheet for Healthcare Providers:  IncredibleEmployment.be  This test is no t yet approved or cleared by the [place] FDA and  has been authorized for detection and/or diagnosis of SARS-CoV-2 by FDA under an Emergency Use Authorization (EUA). This EUA will remain  in effect (meaning this test can be used) for the duration of the COVID-19 declaration under Section 564(b)(1) of the Act, 21 U.S.C.section 360bbb-3(b)(1), unless the authorization is terminated  or revoked  sooner.       Influenza A by PCR NEGATIVE NEGATIVE Final   Influenza B by PCR NEGATIVE NEGATIVE Final    Comment: (NOTE) The Xpert Xpress SARS-CoV-2/FLU/RSV plus assay is intended as an aid in  the diagnosis of influenza from Nasopharyngeal swab specimens and should not be used as a sole basis for treatment. Nasal washings and aspirates are unacceptable for Xpert Xpress SARS-CoV-2/FLU/RSV testing.  Fact Sheet for Patients: EntrepreneurPulse.com.au  Fact Sheet for Healthcare Providers: IncredibleEmployment.be  This test is not yet approved or cleared by the [place] FDA and has been authorized for detection and/or diagnosis of SARS-CoV-2 by FDA under an Emergency Use Authorization (EUA). This EUA will remain in effect (meaning this test can be used) for the duration of the COVID-19 declaration under Section 564(b)(1) of the Act, 21 U.S.C. section 360bbb-3(b)(1), unless the authorization is terminated or revoked.  Performed at [facility], [place] [address], [place], [place] [address]   MRSA PCR Screening     Status: None   Collection Time: 03/05/21  6:28 AM   Specimen: Nasal Mucosa; Nasopharyngeal  Result Value Ref Range Status   MRSA by PCR NEGATIVE NEGATIVE Final    Comment:        The GeneXpert MRSA Assay (FDA approved for NASAL specimens only), is one component of a comprehensive MRSA colonization surveillance program. It is not intended to diagnose MRSA infection nor to guide or monitor treatment for MRSA infections. Performed at [facility], [place] [address] [place],  [address]     RADIOLOGY STUDIES/RESULTS: DG CHEST PORT 1 VIEW  Result Date: 03/08/2021 CLINICAL DATA:  Shortness of breath EXAM: PORTABLE CHEST 1 VIEW COMPARISON:  03/07/2021 FINDINGS: There is hyperinflation of the lungs compatible with COPD. Heart and mediastinal contours are within normal limits. No focal opacities or effusions. No acute  bony abnormality. IMPRESSION: COPD.  No active disease. Electronically Signed   By: Rolm Baptise M.D.   On: 03/08/2021 21:40   ECHOCARDIOGRAM COMPLETE  Result Date: 03/09/2021    ECHOCARDIOGRAM REPORT   Patient Name:   ANDRZEJ SCULLY Date of Exam: 03/09/2021 Medical Rec #:  [id number]     Height:       71.0 in Accession #:    [id number]    Weight:       180.0 lb Date of Birth:  [birth year]      BSA:          2.016 m Patient Age:    58 years      BP:           137/108 mmHg Patient Gender: M             HR:           111 bpm. Exam Location:  Inpatient Procedure: 2D Echo, Cardiac Doppler and Color Doppler Indications:    Acute respiratory distress R06.3  History:        Patient has prior history of Echocardiogram examinations, most                 recent 04/22/2020.  Sonographer:    Merrie Roof RDCS Referring Phys: [place]  1. Left ventricular ejection fraction, by estimation, is 60 to 65%. The left ventricle has normal function. Left ventricular endocardial border not optimally defined to evaluate regional wall motion. Indeterminate diastolic filling due to E-A fusion.  2. Right ventricular systolic function was not well visualized. The right ventricular size is normal. Tricuspid regurgitation signal is inadequate for assessing PA pressure.  3. The mitral valve was not well visualized. No evidence of mitral valve regurgitation. No evidence of mitral stenosis.  4. The aortic valve was not assessed. Aortic  valve regurgitation is not visualized.  5. The inferior vena cava is dilated in size with >50% respiratory variability, suggesting right atrial pressure of 8 mmHg. FINDINGS  Left Ventricle: Left ventricular ejection fraction, by estimation, is 60 to 65%. The left ventricle has normal function. Left ventricular endocardial border not optimally defined to evaluate regional wall motion. The left ventricular internal cavity size was normal in size. There is no left ventricular hypertrophy.  Indeterminate diastolic filling due to E-A fusion. Normal left ventricular filling pressure. Right Ventricle: The right ventricular size is normal. No increase in right ventricular wall thickness. Right ventricular systolic function was not well visualized. Tricuspid regurgitation signal is inadequate for assessing PA pressure. Left Atrium: Left atrial size was not well visualized. Right Atrium: Right atrial size was not well visualized. Pericardium: There is no evidence of pericardial effusion. Mitral Valve: The mitral valve was not well visualized. No evidence of mitral valve regurgitation. No evidence of mitral valve stenosis. Tricuspid Valve: The tricuspid valve is not well visualized. Tricuspid valve regurgitation is not demonstrated. No evidence of tricuspid stenosis. Aortic Valve: The aortic valve was not assessed. Aortic valve regurgitation is not visualized. Aortic valve mean gradient measures 2.0 mmHg. Aortic valve peak gradient measures 4.7 mmHg. Pulmonic Valve: The pulmonic valve was not assessed. Pulmonic valve regurgitation is not visualized. No evidence of pulmonic stenosis. Aorta: Aortic root could not be assessed. Venous: The inferior vena cava is dilated in size with greater than 50% respiratory variability, suggesting right atrial pressure of 8 mmHg. IAS/Shunts: No atrial level shunt detected by color flow Doppler.   Diastology LV e' medial:    6.42 cm/s LV E/e' medial:  10.6 LV e' lateral:   7.18 cm/s LV E/e' lateral: 9.5  IVC IVC diam: 2.10 cm LEFT ATRIUM           Index LA Vol (A4C): 29.0 ml 14.38 ml/m  AORTIC VALVE AV Vmax:           108.00 cm/s AV Vmean:          70.200 cm/s AV VTI:            0.179 m AV Peak Grad:      4.7 mmHg AV Mean Grad:      2.0 mmHg LVOT Vmax:         92.00 cm/s LVOT Vmean:        54.900 cm/s LVOT VTI:          0.158 m LVOT/AV VTI ratio: 0.88 MITRAL VALVE MV Area (PHT): 5.31 cm    SHUNTS MV Decel Time: 143 msec    Systemic VTI: 0.16 m MV E velocity: 68.10 cm/s MV A  velocity: 92.10 cm/s MV E/A ratio:  0.74 Fransico Him MD Electronically signed by Fransico Him MD Signature Date/Time: 03/09/2021/8:09:53 PM    Final       LOS: 6 days   Oren Binet, MD  [facility]    To contact the attending provider between 7A-7P or the covering provider during after hours 7P-7A, please log into the web site [email or web address] and access using universal [place] password for that web site. If you do not have the password, please call the hospital operator.  03/10/2021, 2:36 PM

## 2021-03-11 LAB — CBC
HCT: 41.9 % (ref 39.0–52.0)
Hemoglobin: 14.5 g/dL (ref 13.0–17.0)
MCH: 31.1 pg (ref 26.0–34.0)
MCHC: 34.6 g/dL (ref 30.0–36.0)
MCV: 89.9 fL (ref 80.0–100.0)
Platelets: 205 10*3/uL (ref 150–400)
RBC: 4.66 MIL/uL (ref 4.22–5.81)
RDW: 13.2 % (ref 11.5–15.5)
WBC: 10.6 10*3/uL — ABNORMAL HIGH (ref 4.0–10.5)
nRBC: 0 % (ref 0.0–0.2)

## 2021-03-11 LAB — COMPREHENSIVE METABOLIC PANEL
ALT: 64 U/L — ABNORMAL HIGH (ref 0–44)
AST: 32 U/L (ref 15–41)
Albumin: 2.7 g/dL — ABNORMAL LOW (ref 3.5–5.0)
Alkaline Phosphatase: 44 U/L (ref 38–126)
Anion gap: 7 (ref 5–15)
BUN: 28 mg/dL — ABNORMAL HIGH (ref 8–23)
CO2: 25 mmol/L (ref 22–32)
Calcium: 8.2 mg/dL — ABNORMAL LOW (ref 8.9–10.3)
Chloride: 102 mmol/L (ref 98–111)
Creatinine, Ser: 1.04 mg/dL (ref 0.61–1.24)
GFR, Estimated: >60 mL/min (ref >60)
Glucose, Bld: 124 mg/dL — ABNORMAL HIGH (ref 70–99)
Potassium: 3.6 mmol/L (ref 3.5–5.1)
Sodium: 134 mmol/L — ABNORMAL LOW (ref 135–145)
Total Bilirubin: 1 mg/dL (ref 0.3–1.2)
Total Protein: 5.1 g/dL — ABNORMAL LOW (ref 6.5–8.1)

## 2021-03-11 MED ORDER — BISOPROLOL FUMARATE 10 MG PO TABS
10.0000 mg | ORAL_TABLET | Freq: Every day | ORAL | Status: DC
  Administered 2021-03-11 – 2021-03-13 (×3): 10 mg via ORAL
  Filled 2021-03-11 (×3): qty 1

## 2021-03-11 MED ORDER — PREDNISONE 20 MG PO TABS
40.0000 mg | ORAL_TABLET | Freq: Every day | ORAL | Status: DC
  Administered 2021-03-11 – 2021-03-13 (×3): 40 mg via ORAL
  Filled 2021-03-11 (×3): qty 2

## 2021-03-11 MED ORDER — METOPROLOL TARTRATE 25 MG PO TABS: 25.0000 mg | ORAL_TABLET | Freq: Two times a day (BID) | ORAL | Status: DC

## 2021-03-11 NOTE — TOC Progression Note (Signed)
Transition of Care [facility]) - Progression Note    Patient Details  Name: Mark Tate MRN: [id number] Date of Birth: 1950-12-22  Transition [facility]) CM/SW Contact  Loletha Grayer Beverely Pace, RN Phone Number: 03/11/2021, 12:49 PM  Clinical Narrative: Case Manager spoke with patient concerning discharge needs. Discussed choices for Home Health agencies and need for oxygen at discharge. Referral called to Adela Lank, Liaison . Patient lives home with wife, will have support at discharge.     Expected Discharge Plan: [place] Barriers to Discharge: Continued Medical Work up  Expected Discharge Plan and Services Expected Discharge Plan: [place]   Discharge Planning Services: CM Consult Post Acute Care Choice: Durable Medical Equipment, Home Health Living arrangements for the past 2 months: Single Family Home                 DME Arranged: Walker rolling with seat, Oxygen DME Agency: [employer] Date DME Agency Contacted: 03/11/21 Time DME Agency Contacted: [phone] Representative spoke with at DME Agency: Freda Munro HH Arranged: PT, OT, NA [place] Agency: [place] Date [place]: 03/11/21 Time [place]: 2 Representative spoke with at [place]: [place] ([place]) Interventions    Readmission Risk Interventions No flowsheet data found.

## 2021-03-11 NOTE — Progress Notes (Addendum)
PROGRESS NOTE        PATIENT DETAILS Name: Mark Tate Age: 70 y.o. Sex: male Date of Birth: 03-May-1951 Admit Date: 03/04/2021 Admitting Physician Albertine Patricia, MD PRF:[id number], L.Marlou Sa, MD  Brief Narrative: Patient is a 70 y.o. male with history of COPD, chronic systolic heart failure (EF improved on last echo) presented with 3-4-day history of cough/worsening shortness of breath-found to have acute hypoxic respiratory failure requiring BiPAP support due to COPD exacerbation.  Significant events: 6/7>> admit for COPD exacerbation-on BiPAP 6/8>> off BiPAP-transitioned to 5 L via nasal cannula  Significant studies: 01/01/2020>> Echo: EF 30-35% 04/22/2020>> Echo: EF 60-65% 03/04/2021>> CXR: No obvious pneumonia 03/07/21>> CXR: No obvious consolidation-chronic interstitial thickening. 03/08/2021>> Echo: EF 60-65%  Antimicrobial therapy: Rocephin: 6/7>> 6/11 Zithromax: 6/7>> 6/11  Microbiology data: 6/7>> COVID PCR: Negative  Procedures : None  Consults: None  DVT Prophylaxis : enoxaparin (LOVENOX) injection 40 mg Start: 03/04/21 2200   Subjective: Thinks he is slowly getting better-thinks that he is getting close to his baseline.  Acknowledges that over the past 2 months-he has had significant increase in his exertional shortness of breath.  Seems pretty determined not to smoke postdischarge.   Assessment/Plan: Acute hypoxic respiratory failure due to COPD exacerbation: Required BiPAP on admission-has been gradually tapered down over the past few days-currently on just 2 L of oxygen this morning.  He is moving air well-wheezing has markedly improved over the past few days.  No evidence of volume overload.  He has received a couple doses of IV Lasix to ensure negative balance.  Reviewed outpatient pulmonary note-appears to have mild COPD-suspect that with continued tobacco use-his COPD may have worsened further over the past few years.  Continue  tapering prednisone-continue bronchodilators-his spouse at bedside is is requesting that I speak to his pulmonologist (has seen Dr. Chase Caller in the past).  I will touch base with PCCM later today.  Suspect he may need home O2 on discharge-we will ask nursing staff to perform a ambulatory desaturation screen.  AKI: Likely hemodynamically mediated due to diuretics-resolved.  Hyponatremia: Likely due to diuretics-resolved.  History of chronic combined systolic/diastolic heart failure: Most recent echo showed improved EF-no evidence of volume overload-suspect does not require diuretics today.  Has been getting as needed IV diuretics over the past few days  History of nonobstructive CAD: No anginal symptoms-continue aspirin/statin/Entresto-given diffuse/severe wheezing on admission-Coreg was held-since his bronchospasm has markedly improved over the past 2 days- will switch to a more selective beta-blocker today-start bisoprolol.    Tobacco abuse: Counseled-continue transdermal nicotine.  Seems pretty determined to stop smoking.  History of 3 mm lung nodule: Stable for outpatient follow-up by his pulmonologist.  Diet: Diet Order             Diet Heart Room service appropriate? Yes; Fluid consistency: Thin  Diet effective now                    Code Status: Full code   Family Communication: Spouse at bedside  Disposition Plan: Status is: Inpatient  Remains inpatient appropriate because:Inpatient level of care appropriate due to severity of illness   Dispo: The patient is from: Home              Anticipated d/c is to: Home  Patient currently is not medically stable to d/c.   Difficult to place patient No    Barriers to Discharge: Slowly improving COPD exacerbation-continue embolizations-assess for home O2 requirement-possible discharge in the next 1-2 days.  Antimicrobial agents: Anti-infectives (From admission, onward)    Start     Dose/Rate Route Frequency  Ordered Stop   03/05/21 1200  cefTRIAXone (ROCEPHIN) 1 g in sodium chloride 0.9 % 100 mL IVPB        1 g 200 mL/hr over 30 Minutes Intravenous Every 24 hours 03/04/21 2040 03/09/21 1550   03/05/21 1200  azithromycin (ZITHROMAX) 500 mg in sodium chloride 0.9 % 250 mL IVPB        500 mg 250 mL/hr over 60 Minutes Intravenous Every 24 hours 03/04/21 1510 03/09/21 1332   03/04/21 1330  cefTRIAXone (ROCEPHIN) 1 g in sodium chloride 0.9 % 100 mL IVPB        1 g 200 mL/hr over 30 Minutes Intravenous  Once 03/04/21 1324 03/04/21 1437   03/04/21 1330  azithromycin (ZITHROMAX) 500 mg in sodium chloride 0.9 % 250 mL IVPB        500 mg 250 mL/hr over 60 Minutes Intravenous  Once 03/04/21 1324 03/04/21 1603        Time spent: 25 minutes-Greater than 50% of this time was spent in counseling, explanation of diagnosis, planning of further management, and coordination of care.  MEDICATIONS: Scheduled Meds:  aspirin EC  81 mg Oral Daily   atorvastatin  40 mg Oral Daily   bisoprolol  10 mg Oral Daily   budesonide (PULMICORT) nebulizer solution  0.5 mg Nebulization BID   chlorhexidine  15 mL Mouth Rinse BID   enoxaparin (LOVENOX) injection  40 mg Subcutaneous Q24H   fluticasone  2 spray Each Nare Daily   guaiFENesin  1,200 mg Oral BID   ipratropium-albuterol  3 mL Nebulization Q4H   loratadine  10 mg Oral Daily   mouth rinse  15 mL Mouth Rinse q12n4p   nicotine  21 mg Transdermal Daily   omega-3 acid ethyl esters  1 g Oral BID   pantoprazole  40 mg Oral Q1200   polyethylene glycol  17 g Oral Daily   predniSONE  40 mg Oral Q breakfast   sacubitril-valsartan  1 tablet Oral BID   Continuous Infusions:   PRN Meds:.acetaminophen, albuterol, benzonatate, bisacodyl   PHYSICAL EXAM: Vital signs: Vitals:   03/11/21 0008 03/11/21 0400 03/11/21 0725 03/11/21 0837  BP: 134/87  (!) 146/92   Pulse: 84 67 76 100  Resp: 19 18 20  (!) 21  Temp: 98 F (36.7 C) 97.9 F (36.6 C) 98.5 F (36.9 C)    TempSrc: Oral Oral Oral   SpO2: 95% 95% 93% 94%  Weight:      Height:       Filed Weights   03/04/21 1758  Weight: 81.6 kg   Body mass index is 25.1 kg/m.   Gen Exam:Alert awake-not in any distress HEENT:atraumatic, normocephalic Chest: Moving air well-still with some rhonchi-but much better compared to the past few days. CVS:S1S2 regular Abdomen:soft non tender, non distended Extremities:no edema Neurology: Non focal Skin: no rash   I have personally reviewed following labs and imaging studies  LABORATORY DATA: CBC: Recent Labs  Lab 03/07/21 0323 03/08/21 0752 03/09/21 0040 03/10/21 0330 03/11/21 0048  WBC 14.5* 11.3* 11.6* 10.1 10.6*  HGB 14.6 14.3 14.6 14.5 14.5  HCT 41.8 41.9 43.3 42.4 41.9  MCV 91.3 91.9 91.7 91.2  89.9  PLT 201 200 223 202 205     Basic Metabolic Panel: Recent Labs  Lab 03/07/21 0323 03/08/21 0752 03/09/21 0040 03/10/21 0330 03/11/21 0048  NA 134* 136 137 138 134*  K 4.4 4.3 3.9 4.2 3.6  CL 102 104 106 104 102  CO2 25 24 27 30 25   GLUCOSE 153* 140* 129* 128* 124*  BUN 44* 30* 31* 24* 28*  CREATININE 1.13 0.98 0.91 0.98 1.04  CALCIUM 8.7* 8.5* 8.6* 8.4* 8.2*     GFR: Estimated Creatinine Clearance: 68.4 mL/min (by C-G formula based on SCr of 1.04 mg/dL).  Liver Function Tests: Recent Labs  Lab 03/04/21 1323 03/08/21 0752 03/09/21 0040 03/10/21 0330 03/11/21 0048  AST 45* 45* 51* 36 32  ALT 21 61* 77* 68* 64*  ALKPHOS 62 39 43 39 44  BILITOT 1.1 0.7 0.6 0.9 1.0  PROT 6.9 5.3* 5.6* 5.2* 5.1*  ALBUMIN 3.6 2.7* 2.8* 2.6* 2.7*    No results for input(s): LIPASE, AMYLASE in the last 168 hours. No results for input(s): AMMONIA in the last 168 hours.  Coagulation Profile: No results for input(s): INR, PROTIME in the last 168 hours.  Cardiac Enzymes: No results for input(s): CKTOTAL, CKMB, CKMBINDEX, TROPONINI in the last 168 hours.  BNP (last 3 results) No results for input(s): PROBNP in the last 8760  hours.  Lipid Profile: No results for input(s): CHOL, HDL, LDLCALC, TRIG, CHOLHDL, LDLDIRECT in the last 72 hours.  Thyroid Function Tests: No results for input(s): TSH, T4TOTAL, FREET4, T3FREE, THYROIDAB in the last 72 hours.  Anemia Panel: No results for input(s): VITAMINB12, FOLATE, FERRITIN, TIBC, IRON, RETICCTPCT in the last 72 hours.  Urine analysis:    Component Value Date/Time   COLORURINE AMBER (A) 03/04/2021 1626   APPEARANCEUR HAZY (A) 03/04/2021 1626   LABSPEC 1.024 03/04/2021 1626   PHURINE 5.0 03/04/2021 1626   GLUCOSEU NEGATIVE 03/04/2021 1626   HGBUR NEGATIVE 03/04/2021 1626   BILIRUBINUR NEGATIVE 03/04/2021 1626   KETONESUR 5 (A) 03/04/2021 1626   PROTEINUR 30 (A) 03/04/2021 1626   NITRITE NEGATIVE 03/04/2021 1626   LEUKOCYTESUR NEGATIVE 03/04/2021 1626    Sepsis Labs: Lactic Acid, Venous    Component Value Date/Time   LATICACIDVEN 1.9 03/04/2021 1626    MICROBIOLOGY: Recent Results (from the past 240 hour(s))  Resp Panel by RT-PCR (Flu A&B, Covid) Nasopharyngeal Swab     Status: None   Collection Time: 03/04/21 10:13 AM   Specimen: Nasopharyngeal Swab; Nasopharyngeal(NP) swabs in vial transport medium  Result Value Ref Range Status   SARS Coronavirus 2 by RT PCR NEGATIVE NEGATIVE Final    Comment: (NOTE) SARS-CoV-2 target nucleic acids are NOT DETECTED.  The SARS-CoV-2 RNA is generally detectable in upper respiratory specimens during the acute phase of infection. The lowest concentration of SARS-CoV-2 viral copies this assay can detect is 138 copies/mL. A negative result does not preclude SARS-Cov-2 infection and should not be used as the sole basis for treatment or other patient management decisions. A negative result may occur with  improper specimen collection/handling, submission of specimen other than nasopharyngeal swab, presence of viral mutation(s) within the areas targeted by this assay, and inadequate number of viral copies(<138  copies/mL). A negative result must be combined with clinical observations, patient history, and epidemiological information. The expected result is Negative.  Fact Sheet for Patients:  EntrepreneurPulse.com.au  Fact Sheet for Healthcare Providers:  IncredibleEmployment.be  This test is no t yet approved or cleared by the [place]  FDA and  has been authorized for detection and/or diagnosis of SARS-CoV-2 by FDA under an Emergency Use Authorization (EUA). This EUA will remain  in effect (meaning this test can be used) for the duration of the COVID-19 declaration under Section 564(b)(1) of the Act, 21 U.S.C.section 360bbb-3(b)(1), unless the authorization is terminated  or revoked sooner.       Influenza A by PCR NEGATIVE NEGATIVE Final   Influenza B by PCR NEGATIVE NEGATIVE Final    Comment: (NOTE) The Xpert Xpress SARS-CoV-2/FLU/RSV plus assay is intended as an aid in the diagnosis of influenza from Nasopharyngeal swab specimens and should not be used as a sole basis for treatment. Nasal washings and aspirates are unacceptable for Xpert Xpress SARS-CoV-2/FLU/RSV testing.  Fact Sheet for Patients: EntrepreneurPulse.com.au  Fact Sheet for Healthcare Providers: IncredibleEmployment.be  This test is not yet approved or cleared by the [place] FDA and has been authorized for detection and/or diagnosis of SARS-CoV-2 by FDA under an Emergency Use Authorization (EUA). This EUA will remain in effect (meaning this test can be used) for the duration of the COVID-19 declaration under Section 564(b)(1) of the Act, 21 U.S.C. section 360bbb-3(b)(1), unless the authorization is terminated or revoked.  Performed at [facility], [place] [address], [place], [place] [address]   MRSA PCR Screening     Status: None   Collection Time: 03/05/21  6:28 AM   Specimen: Nasal Mucosa; Nasopharyngeal  Result Value Ref  Range Status   MRSA by PCR NEGATIVE NEGATIVE Final    Comment:        The GeneXpert MRSA Assay (FDA approved for NASAL specimens only), is one component of a comprehensive MRSA colonization surveillance program. It is not intended to diagnose MRSA infection nor to guide or monitor treatment for MRSA infections. Performed at [facility], [place] [address] [place], [place] [address]     RADIOLOGY STUDIES/RESULTS: ECHOCARDIOGRAM COMPLETE  Result Date: 03/09/2021    ECHOCARDIOGRAM REPORT   Patient Name:   TALHA ISER Date of Exam: 03/09/2021 Medical Rec #:  [id number]     Height:       71.0 in Accession #:    [id number]    Weight:       180.0 lb Date of Birth:  1951-04-14      BSA:          2.016 m Patient Age:    4 years      BP:           137/108 mmHg Patient Gender: M             HR:           111 bpm. Exam Location:  Inpatient Procedure: 2D Echo, Cardiac Doppler and Color Doppler Indications:    Acute respiratory distress R06.3  History:        Patient has prior history of Echocardiogram examinations, most                 recent 04/22/2020.  Sonographer:    Merrie Roof RDCS Referring Phys: Cla[place]  1. Left ventricular ejection fraction, by estimation, is 60 to 65%. The left ventricle has normal function. Left ventricular endocardial border not optimally defined to evaluate regional wall motion. Indeterminate diastolic filling due to E-A fusion.  2. Right ventricular systolic function was not well visualized. The right ventricular size is normal. Tricuspid regurgitation signal is inadequate for assessing PA pressure.  3. The mitral  valve was not well visualized. No evidence of mitral valve regurgitation. No evidence of mitral stenosis.  4. The aortic valve was not assessed. Aortic valve regurgitation is not visualized.  5. The inferior vena cava is dilated in size with >50% respiratory variability, suggesting right atrial pressure of 8 mmHg. FINDINGS  Left Ventricle:  Left ventricular ejection fraction, by estimation, is 60 to 65%. The left ventricle has normal function. Left ventricular endocardial border not optimally defined to evaluate regional wall motion. The left ventricular internal cavity size was normal in size. There is no left ventricular hypertrophy. Indeterminate diastolic filling due to E-A fusion. Normal left ventricular filling pressure. Right Ventricle: The right ventricular size is normal. No increase in right ventricular wall thickness. Right ventricular systolic function was not well visualized. Tricuspid regurgitation signal is inadequate for assessing PA pressure. Left Atrium: Left atrial size was not well visualized. Right Atrium: Right atrial size was not well visualized. Pericardium: There is no evidence of pericardial effusion. Mitral Valve: The mitral valve was not well visualized. No evidence of mitral valve regurgitation. No evidence of mitral valve stenosis. Tricuspid Valve: The tricuspid valve is not well visualized. Tricuspid valve regurgitation is not demonstrated. No evidence of tricuspid stenosis. Aortic Valve: The aortic valve was not assessed. Aortic valve regurgitation is not visualized. Aortic valve mean gradient measures 2.0 mmHg. Aortic valve peak gradient measures 4.7 mmHg. Pulmonic Valve: The pulmonic valve was not assessed. Pulmonic valve regurgitation is not visualized. No evidence of pulmonic stenosis. Aorta: Aortic root could not be assessed. Venous: The inferior vena cava is dilated in size with greater than 50% respiratory variability, suggesting right atrial pressure of 8 mmHg. IAS/Shunts: No atrial level shunt detected by color flow Doppler.   Diastology LV e' medial:    6.42 cm/s LV E/e' medial:  10.6 LV e' lateral:   7.18 cm/s LV E/e' lateral: 9.5  IVC IVC diam: 2.10 cm LEFT ATRIUM           Index LA Vol (A4C): 29.0 ml 14.38 ml/m  AORTIC VALVE AV Vmax:           108.00 cm/s AV Vmean:          70.200 cm/s AV VTI:             0.179 m AV Peak Grad:      4.7 mmHg AV Mean Grad:      2.0 mmHg LVOT Vmax:         92.00 cm/s LVOT Vmean:        54.900 cm/s LVOT VTI:          0.158 m LVOT/AV VTI ratio: 0.88 MITRAL VALVE MV Area (PHT): 5.31 cm    SHUNTS MV Decel Time: 143 msec    Systemic VTI: 0.16 m MV E velocity: 68.10 cm/s MV A velocity: 92.10 cm/s MV E/A ratio:  0.74 Fransico Him MD Electronically signed by Fransico Him MD Signature Date/Time: 03/09/2021/8:09:53 PM    Final       LOS: 7 days   Oren Binet, MD  [facility]    To contact the attending provider between 7A-7P or the covering provider during after hours 7P-7A, please log into the web site [email or web address] and access using universal [place] password for that web site. If you do not have the password, please call the hospital operator.  03/11/2021, 10:56 AM

## 2021-03-12 ENCOUNTER — Telehealth: Payer: Self-pay | Admitting: Critical Care Medicine

## 2021-03-12 ENCOUNTER — Encounter (HOSPITAL_COMMUNITY): Payer: Self-pay | Admitting: Internal Medicine

## 2021-03-12 LAB — COMPREHENSIVE METABOLIC PANEL
ALT: 61 U/L — ABNORMAL HIGH (ref 0–44)
AST: 26 U/L (ref 15–41)
Albumin: 2.6 g/dL — ABNORMAL LOW (ref 3.5–5.0)
Alkaline Phosphatase: 40 U/L (ref 38–126)
Anion gap: 6 (ref 5–15)
BUN: 27 mg/dL — ABNORMAL HIGH (ref 8–23)
CO2: 25 mmol/L (ref 22–32)
Calcium: 8.3 mg/dL — ABNORMAL LOW (ref 8.9–10.3)
Chloride: 105 mmol/L (ref 98–111)
Creatinine, Ser: 0.95 mg/dL (ref 0.61–1.24)
GFR, Estimated: >60 mL/min (ref >60)
Glucose, Bld: 119 mg/dL — ABNORMAL HIGH (ref 70–99)
Potassium: 3.6 mmol/L (ref 3.5–5.1)
Sodium: 136 mmol/L (ref 135–145)
Total Bilirubin: 0.8 mg/dL (ref 0.3–1.2)
Total Protein: 5 g/dL — ABNORMAL LOW (ref 6.5–8.1)

## 2021-03-12 LAB — CBC
HCT: 42.8 % (ref 39.0–52.0)
Hemoglobin: 14.8 g/dL (ref 13.0–17.0)
MCH: 31.4 pg (ref 26.0–34.0)
MCHC: 34.6 g/dL (ref 30.0–36.0)
MCV: 90.7 fL (ref 80.0–100.0)
Platelets: 209 10*3/uL (ref 150–400)
RBC: 4.72 MIL/uL (ref 4.22–5.81)
RDW: 13.1 % (ref 11.5–15.5)
WBC: 11.9 10*3/uL — ABNORMAL HIGH (ref 4.0–10.5)
nRBC: 0 % (ref 0.0–0.2)

## 2021-03-12 MED ORDER — ALBUTEROL (5 MG/ML) CONTINUOUS INHALATION SOLN
10.0000 mg/h | INHALATION_SOLUTION | RESPIRATORY_TRACT | Status: DC
  Filled 2021-03-12: qty 20

## 2021-03-12 MED ORDER — METHYLPREDNISOLONE SODIUM SUCC 125 MG IJ SOLR
125.0000 mg | Freq: Once | INTRAMUSCULAR | Status: AC
  Administered 2021-03-12: 125 mg via INTRAVENOUS
  Filled 2021-03-12: qty 2

## 2021-03-12 NOTE — Progress Notes (Signed)
PROGRESS NOTE        PATIENT DETAILS Name: Mark Tate Age: 70 y.o. Sex: male Date of Birth: 1951-07-28 Admit Date: 03/04/2021 Admitting Physician Albertine Patricia, MD BJS:[id number], L.Marlou Sa, MD  Brief Narrative: Patient is a 70 y.o. male with history of COPD, chronic systolic heart failure (EF improved on last echo) presented with 3-4-day history of cough/worsening shortness of breath-found to have acute hypoxic respiratory failure requiring BiPAP support due to COPD exacerbation.  Significant events: 6/7>> admit for COPD exacerbation-on BiPAP 6/8>> off BiPAP-transitioned to 5 L via nasal cannula  Significant studies: 01/01/2020>> Echo: EF 30-35% 04/22/2020>> Echo: EF 60-65% 03/04/2021>> CXR: No obvious pneumonia 03/07/21>> CXR: No obvious consolidation-chronic interstitial thickening. 03/08/2021>> Echo: EF 60-65%  Antimicrobial therapy: Rocephin: 6/7>> 6/11 Zithromax: 6/7>> 6/11  Microbiology data: 6/7>> COVID PCR: Negative  Procedures : None  Consults: None  DVT Prophylaxis : enoxaparin (LOVENOX) injection 40 mg Start: 03/04/21 2200   Subjective: Was stable overnight-had a evidence of bronchospasm with exertion earlier this morning.   Assessment/Plan: Acute hypoxic respiratory failure due to COPD exacerbation: Required BiPAP on admission-has been gradually tapered down to around 2-3 L.  Has been treated with steroids/bronchodilators-and as needed doses of IV Lasix to ensure negative balance.  He has also completed a course of IV antimicrobial therapy.  He continues to have intermittent issues with bronchospasm/exertional dyspnea.  Appreciate pulmonary input-we will watch over the next day or so-and if he remains stable-suspect can be discharged home.  We will plan on slow prednisone taper over 1-2 weeks.  Suspect he will require home O2.  PCCM will arrange for outpatient follow-up.  AKI: Likely hemodynamically mediated due to  diuretics-resolved.  Hyponatremia: Likely due to diuretics-resolved.  History of chronic combined systolic/diastolic heart failure: Most recent echo showed improved EF-no evidence of volume overload-suspect does not require diuretics today.  Has been getting as needed IV diuretics over the past few days  History of nonobstructive CAD: No anginal symptoms-continue aspirin/statin/Entresto-given diffuse/severe wheezing on admission-Coreg was held-since his bronchospasm has markedly improved-he has been switched to bisoprolol (more selective given bronchospasm)  Tobacco abuse: Counseled-continue transdermal nicotine.  Seems pretty determined to stop smoking.  History of 3 mm lung nodule: Stable for outpatient follow-up by his pulmonologist.  Diet: Diet Order             Diet Heart Room service appropriate? Yes; Fluid consistency: Thin  Diet effective now                    Code Status: Full code   Family Communication: Spouse at bedside  Disposition Plan: Status is: Inpatient  Remains inpatient appropriate because:Inpatient level of care appropriate due to severity of illness   Dispo: The patient is from: Home              Anticipated d/c is to: Home              Patient currently is not medically stable to d/c.   Difficult to place patient No    Barriers to Discharge: Bronchospasm this morning-watch overnight-if stable-will -consider discharge on 6/16.  Antimicrobial agents: Anti-infectives (From admission, onward)    Start     Dose/Rate Route Frequency Ordered Stop   03/05/21 1200  cefTRIAXone (ROCEPHIN) 1 g in sodium chloride 0.9 % 100 mL IVPB  1 g 200 mL/hr over 30 Minutes Intravenous Every 24 hours 03/04/21 2040 03/09/21 1550   03/05/21 1200  azithromycin (ZITHROMAX) 500 mg in sodium chloride 0.9 % 250 mL IVPB        500 mg 250 mL/hr over 60 Minutes Intravenous Every 24 hours 03/04/21 1510 03/09/21 1332   03/04/21 1330  cefTRIAXone (ROCEPHIN) 1 g in  sodium chloride 0.9 % 100 mL IVPB        1 g 200 mL/hr over 30 Minutes Intravenous  Once 03/04/21 1324 03/04/21 1437   03/04/21 1330  azithromycin (ZITHROMAX) 500 mg in sodium chloride 0.9 % 250 mL IVPB        500 mg 250 mL/hr over 60 Minutes Intravenous  Once 03/04/21 1324 03/04/21 1603        Time spent: 25 minutes-Greater than 50% of this time was spent in counseling, explanation of diagnosis, planning of further management, and coordination of care.  MEDICATIONS: Scheduled Meds:  aspirin EC  81 mg Oral Daily   atorvastatin  40 mg Oral Daily   bisoprolol  10 mg Oral Daily   budesonide (PULMICORT) nebulizer solution  0.5 mg Nebulization BID   chlorhexidine  15 mL Mouth Rinse BID   enoxaparin (LOVENOX) injection  40 mg Subcutaneous Q24H   fluticasone  2 spray Each Nare Daily   guaiFENesin  1,200 mg Oral BID   ipratropium-albuterol  3 mL Nebulization Q4H   loratadine  10 mg Oral Daily   mouth rinse  15 mL Mouth Rinse q12n4p   nicotine  21 mg Transdermal Daily   omega-3 acid ethyl esters  1 g Oral BID   pantoprazole  40 mg Oral Q1200   polyethylene glycol  17 g Oral Daily   predniSONE  40 mg Oral Q breakfast   sacubitril-valsartan  1 tablet Oral BID   Continuous Infusions:  albuterol      PRN Meds:.acetaminophen, albuterol, benzonatate, bisacodyl   PHYSICAL EXAM: Vital signs: Vitals:   03/12/21 0833 03/12/21 0834 03/12/21 1113 03/12/21 1211  BP:    (!) 137/95  Pulse: 82 81 72 70  Resp: (!) 23 18 20  (!) 24  Temp:    97.7 F (36.5 C)  TempSrc:    Oral  SpO2: 92% 93% 93% 90%  Weight:      Height:       Filed Weights   03/04/21 1758  Weight: 81.6 kg   Body mass index is 25.1 kg/m.   Gen Exam:Alert awake-not in any distress HEENT:atraumatic, normocephalic Chest: Moving air well-+ rhonchi CVS:S1S2 regular Abdomen:soft non tender, non distended Extremities:no edema Neurology: Non focal Skin: no rash   I have personally reviewed following labs and imaging  studies  LABORATORY DATA: CBC: Recent Labs  Lab 03/08/21 0752 03/09/21 0040 03/10/21 0330 03/11/21 0048 03/12/21 0055  WBC 11.3* 11.6* 10.1 10.6* 11.9*  HGB 14.3 14.6 14.5 14.5 14.8  HCT 41.9 43.3 42.4 41.9 42.8  MCV 91.9 91.7 91.2 89.9 90.7  PLT 200 223 202 205 209     Basic Metabolic Panel: Recent Labs  Lab 03/08/21 0752 03/09/21 0040 03/10/21 0330 03/11/21 0048 03/12/21 0055  NA 136 137 138 134* 136  K 4.3 3.9 4.2 3.6 3.6  CL 104 106 104 102 105  CO2 24 27 30 25 25   GLUCOSE 140* 129* 128* 124* 119*  BUN 30* 31* 24* 28* 27*  CREATININE 0.98 0.91 0.98 1.04 0.95  CALCIUM 8.5* 8.6* 8.4* 8.2* 8.3*     GFR:  Estimated Creatinine Clearance: 74.9 mL/min (by C-G formula based on SCr of 0.95 mg/dL).  Liver Function Tests: Recent Labs  Lab 03/08/21 0752 03/09/21 0040 03/10/21 0330 03/11/21 0048 03/12/21 0055  AST 45* 51* 36 32 26  ALT 61* 77* 68* 64* 61*  ALKPHOS 39 43 39 44 40  BILITOT 0.7 0.6 0.9 1.0 0.8  PROT 5.3* 5.6* 5.2* 5.1* 5.0*  ALBUMIN 2.7* 2.8* 2.6* 2.7* 2.6*    No results for input(s): LIPASE, AMYLASE in the last 168 hours. No results for input(s): AMMONIA in the last 168 hours.  Coagulation Profile: No results for input(s): INR, PROTIME in the last 168 hours.  Cardiac Enzymes: No results for input(s): CKTOTAL, CKMB, CKMBINDEX, TROPONINI in the last 168 hours.  BNP (last 3 results) No results for input(s): PROBNP in the last 8760 hours.  Lipid Profile: No results for input(s): CHOL, HDL, LDLCALC, TRIG, CHOLHDL, LDLDIRECT in the last 72 hours.  Thyroid Function Tests: No results for input(s): TSH, T4TOTAL, FREET4, T3FREE, THYROIDAB in the last 72 hours.  Anemia Panel: No results for input(s): VITAMINB12, FOLATE, FERRITIN, TIBC, IRON, RETICCTPCT in the last 72 hours.  Urine analysis:    Component Value Date/Time   COLORURINE AMBER (A) 03/04/2021 1626   APPEARANCEUR HAZY (A) 03/04/2021 1626   LABSPEC 1.024 03/04/2021 1626   PHURINE  5.0 03/04/2021 1626   GLUCOSEU NEGATIVE 03/04/2021 1626   HGBUR NEGATIVE 03/04/2021 1626   BILIRUBINUR NEGATIVE 03/04/2021 1626   KETONESUR 5 (A) 03/04/2021 1626   PROTEINUR 30 (A) 03/04/2021 1626   NITRITE NEGATIVE 03/04/2021 1626   LEUKOCYTESUR NEGATIVE 03/04/2021 1626    Sepsis Labs: Lactic Acid, Venous    Component Value Date/Time   LATICACIDVEN 1.9 03/04/2021 1626    MICROBIOLOGY: Recent Results (from the past 240 hour(s))  Resp Panel by RT-PCR (Flu A&B, Covid) Nasopharyngeal Swab     Status: None   Collection Time: 03/04/21 10:13 AM   Specimen: Nasopharyngeal Swab; Nasopharyngeal(NP) swabs in vial transport medium  Result Value Ref Range Status   SARS Coronavirus 2 by RT PCR NEGATIVE NEGATIVE Final    Comment: (NOTE) SARS-CoV-2 target nucleic acids are NOT DETECTED.  The SARS-CoV-2 RNA is generally detectable in upper respiratory specimens during the acute phase of infection. The lowest concentration of SARS-CoV-2 viral copies this assay can detect is 138 copies/mL. A negative result does not preclude SARS-Cov-2 infection and should not be used as the sole basis for treatment or other patient management decisions. A negative result may occur with  improper specimen collection/handling, submission of specimen other than nasopharyngeal swab, presence of viral mutation(s) within the areas targeted by this assay, and inadequate number of viral copies(<138 copies/mL). A negative result must be combined with clinical observations, patient history, and epidemiological information. The expected result is Negative.  Fact Sheet for Patients:  EntrepreneurPulse.com.au  Fact Sheet for Healthcare Providers:  IncredibleEmployment.be  This test is no t yet approved or cleared by the [place] FDA and  has been authorized for detection and/or diagnosis of SARS-CoV-2 by FDA under an Emergency Use Authorization (EUA). This EUA will remain   in effect (meaning this test can be used) for the duration of the COVID-19 declaration under Section 564(b)(1) of the Act, 21 U.S.C.section 360bbb-3(b)(1), unless the authorization is terminated  or revoked sooner.       Influenza A by PCR NEGATIVE NEGATIVE Final   Influenza B by PCR NEGATIVE NEGATIVE Final    Comment: (NOTE) The Xpert Xpress SARS-CoV-2/FLU/RSV plus  assay is intended as an aid in the diagnosis of influenza from Nasopharyngeal swab specimens and should not be used as a sole basis for treatment. Nasal washings and aspirates are unacceptable for Xpert Xpress SARS-CoV-2/FLU/RSV testing.  Fact Sheet for Patients: EntrepreneurPulse.com.au  Fact Sheet for Healthcare Providers: IncredibleEmployment.be  This test is not yet approved or cleared by the [place] FDA and has been authorized for detection and/or diagnosis of SARS-CoV-2 by FDA under an Emergency Use Authorization (EUA). This EUA will remain in effect (meaning this test can be used) for the duration of the COVID-19 declaration under Section 564(b)(1) of the Act, 21 U.S.C. section 360bbb-3(b)(1), unless the authorization is terminated or revoked.  Performed at [facility], [place] [address], [place], [place] [address]   MRSA PCR Screening     Status: None   Collection Time: 03/05/21  6:28 AM   Specimen: Nasal Mucosa; Nasopharyngeal  Result Value Ref Range Status   MRSA by PCR NEGATIVE NEGATIVE Final    Comment:        The GeneXpert MRSA Assay (FDA approved for NASAL specimens only), is one component of a comprehensive MRSA colonization surveillance program. It is not intended to diagnose MRSA infection nor to guide or monitor treatment for MRSA infections. Performed at [facility], [place] [address] [place], [place] [address]     RADIOLOGY STUDIES/RESULTS: No results found.    LOS: 8 days   Oren Binet, MD  [facility]    To  contact the attending provider between 7A-7P or the covering provider during after hours 7P-7A, please log into the web site [email or web address] and access using universal [place] password for that web site. If you do not have the password, please call the hospital operator.  03/12/2021, 1:47 PM

## 2021-03-12 NOTE — Plan of Care (Signed)

## 2021-03-12 NOTE — Telephone Encounter (Signed)
LC---appt scheduled with SG on 06/29 at 2 pm for HFU.

## 2021-03-12 NOTE — Progress Notes (Signed)
Occupational Therapy Treatment Patient Details Name: Mark Tate MRN: [id number] DOB: [birth year] Today's Date: 03/12/2021    History of present illness 70yo male admitted 6/7 with c/o SOB and DOE after exposure to a Covid + family member 5 days ago. Covid negative. Hypoxic to 85% in ED and required 4LPM O2. Found to have acute hypoxic/hypercarbic respiratory failure with COPD exacerbation. PMH CAD, COPD, SOB, tobacco use   OT comments  Pt making steady progress towards OT goals this session. Pt eager to work with therapy requesting to work on functional mobility. Pt continues to present with decreased activity tolerance and generalized deconditioning. Pt currently requires min guard assist for functional mobility greater than a household distance with Rw. Pt on 2L [place] at beginning of session, pt required short increase to 4L [place] upon initial mobility but able to wean down to 3L during hallway distance ( ~100 ft ) and maintain SpO2 of >88%. Once pt back in recliner, able to wean pt back down to 2L [place]. Reviewed using IS ( pulling 1500 mL) and flutter valve effectively. Pt would continue to benefit from skilled occupational therapy while admitted and after d/c to address the below listed limitations in order to improve overall functional mobility and facilitate independence with BADL participation. DC plan remains appropriate, will follow acutely per POC.     Follow Up Recommendations  Home health OT    Equipment Recommendations  Other (comment) (rollator)    Recommendations for Other Services      Precautions / Restrictions Precautions Precautions: Other (comment);Fall Precaution Comments: watch O2 Restrictions Weight Bearing Restrictions: No       Mobility Bed Mobility               General bed mobility comments: OOB in chair and left up in chair at end of session    Transfers Overall transfer level: Needs assistance Equipment used: Rolling walker (2 wheeled) Transfers: Sit  to/from Stand Sit to Stand: Min guard         General transfer comment: min guard for safety to rise from recliner    Balance Overall balance assessment: Needs assistance Sitting-balance support: No upper extremity supported;Feet supported Sitting balance-Leahy Scale: Good     Standing balance support: Bilateral upper extremity supported Standing balance-Leahy Scale: Poor Standing balance comment: reliant on BUE support                           ADL either performed or assessed with clinical judgement   ADL Overall ADL's : Needs assistance/impaired                 Upper Body Dressing : Set up;Sitting Upper Body Dressing Details (indicate cue type and reason): to don posterior gown     Toilet Transfer: Min Marine scientist Details (indicate cue type and reason): simulated via functional mobility with Rw         Functional mobility during ADLs: Min guard;Rolling walker General ADL Comments: pt able to prorgress functional mobility distance this session to greater than a household distance     Vision       Perception     Praxis      Cognition Arousal/Alertness: Awake/alert Behavior During Therapy: WFL for tasks assessed/performed Overall Cognitive Status: Within Functional Limits for tasks assessed  Exercises Other Exercises Other Exercises: eduation provided on correct use of IS ( keeping ball in between arrows) with pt able to pull 1569mL   Shoulder Instructions       General Comments pt on 2L [place] upon arrival, bumped up pt to 4L [place] initially during functional mobility however pt able to maintain SpO2 >88% on 3L during hallway ambulation, pts wife present during session    Pertinent Vitals/ Pain       Pain Assessment: No/denies pain  Home Living                                          Prior Functioning/Environment               Frequency  Min 2X/week        Progress Toward Goals  OT Goals(current goals can now be found in the care plan section)  Progress towards OT goals: Progressing toward goals  Acute Rehab OT Goals Patient Stated Goal: to go home OT Goal Formulation: With patient Time For Goal Achievement: 03/21/21 Potential to Achieve Goals: Good  Plan Discharge plan remains appropriate;Frequency remains appropriate    Co-evaluation                 AM-PAC OT "6 Clicks" Daily Activity     Outcome Measure   Help from another person eating meals?: None Help from another person taking care of personal grooming?: A Little Help from another person toileting, which includes using toliet, bedpan, or urinal?: A Little Help from another person bathing (including washing, rinsing, drying)?: A Little Help from another person to put on and taking off regular upper body clothing?: None Help from another person to put on and taking off regular lower body clothing?: A Little 6 Click Score: 20    End of Session Equipment Utilized During Treatment: Gait belt;Rolling walker;Oxygen;Other (comment) (2-4l [place])  OT Visit Diagnosis: Other abnormalities of gait and mobility (R26.89);Muscle weakness (generalized) (M62.81)   Activity Tolerance Patient tolerated treatment well   Patient Left in chair;with call bell/phone within reach;with family/visitor present   Nurse Communication Mobility status        Time: 0347-4259 OT Time Calculation (min): 25 min  Charges: OT General Charges $OT Visit: 1 Visit OT Treatments $Therapeutic Activity: 23-37 mins  Mark Tate., COTA/L Acute Rehabilitation Services [phone]    Mark Tate 03/12/2021, 2:22 PM

## 2021-03-12 NOTE — Progress Notes (Signed)
SATURATION QUALIFICATIONS: (This note is used to comply with regulatory documentation for home oxygen)  Patient Saturations on Room Air at Rest = 87%  Patient Saturations on 3 Liters of oxygen while Ambulating = 89%  Please briefly explain why patient needs home oxygen: Pt needs home O2 in order to maintain sats above 88%

## 2021-03-12 NOTE — Consult Note (Signed)
NAME:  Mark Tate, MRN:  [id number], DOB:  30-Dec-1950, LOS: 8 ADMISSION DATE:  03/04/2021, CONSULTATION DATE:  03/12/21 REFERRING MD:  Sloan Leiter CHIEF COMPLAINT:  Dyspnea   History of Present Illness:  Mark Tate is a 70 y.o. male who has a PMH including but not limited to COPD, seen by Dr. Chase Caller in the past (last 03/2018).  He was admitted 03/04/21 with dyspnea x 3 - 4 days along 2/2 AECOPD.  He initially required BiPAP but was transitioned down to [place] (had not previously required supplemental O2).  He was treated in standard fashion with BD's, steroids, abx (Azithromycin and Ceftriaxone).  His wife had requested that pulmonary team be notified of his admission.  He had planned discharge for 6/15 and PCCM asked to stop by for courtesy visit that morning.  Upon our visit, pt had just gotten out of bed to change, wash up, and walk to recliner so that he could eat breakfast.  This activity caused him to have worsening dyspnea and hypoxia to the point his O2 requirements climbed from 2L to 9L.  Sats currently 90%.  He is quite dyspneic but is able to speak to me with short breaks.  He states this is what has been happening with exertion but after a few minutes, things settle out and he is almost back to baseline.  He denies any new fevers/chills/sweats, chest pain, severe ongoing dyspnea, light headedness.    He states he had still been smoking prior to this admit; however, "I'm now done with cigarettes".  I reiterated and stressed importance of smoking cessation and he states that he understands and agrees to continue working on cessation.  Prior to admit, he had been using Breztri and not Symbicort.  Pertinent  Medical History:  has CAD in native artery; Dilated cardiomyopathy ([place]); COPD (chronic obstructive pulmonary disease) ([place]); Tobacco use; Abnormal stress test; and COPD exacerbation ([place]) on their problem list.   Significant Hospital Events: Including procedures, antibiotic start and stop  dates in addition to other pertinent events   6/7 > admit. 6/15 > PCCM consult.  Micro Data: Flu 6/15 > neg. SARS CoV2  6/15 > neg.  Antibiotics: Azithromycin 6/7 > 6/11. Ceftriaxone 6/7 > 6/11.  Interim History / Subjective:  Having worsening dyspnea after getting up from bed to wash up, change, and walk to recliner for breakfast. Up to  9L currently.  States this has been typical with exertion but things settle down thereafter. Has wheezing R > L.  Objective:  Blood pressure (!) 148/91, pulse 81, temperature 97.8 F (36.6 C), temperature source Oral, resp. rate 18, height 5\' 11"  (1.803 m), weight 81.6 kg, SpO2 93 %.        Intake/Output Summary (Last 24 hours) at 03/12/2021 0841 Last data filed at 03/12/2021 0433 Gross per 24 hour  Intake 120 ml  Output 1380 ml  Net -1260 ml   Filed Weights   03/04/21 1758  Weight: 81.6 kg    Examination: General: Adult male, sitting in chair eating breakfast, in NAD. Neuro: A&O x 3, no deficits. HEENT: [place]/AT. Sclerae anicteric. EOMI. Cardiovascular: RRR, no M/R/G.  Lungs: Respirations shallow and mildly labored.  Wheezing bilaterally but more so on the right. Abdomen: BS x 4, soft, NT/ND.  Musculoskeletal: No gross deformities, no edema.  Skin: Intact, warm, no rashes.  Labs/imaging personally reviewed:  Echo 6/12 > EF 60-65%.  Assessment & Plan:   AECOPD. - Continue BD's as ordered (Budesonide / DuoNebs  in lieu of home [place]). - Continue Prednisone but add in 1 time dose 125mg  Solumedrol now. - Add 1 time CAT now. - Continue IS and push pulmonary hygiene. - Needs ongoing work with PT. - Will ensure close follow up after discharge with our office (will schedule this now for the next 7 - 10 days).  Acute hypoxic respiratory failure - 2/2 above. - Continue supplemental O2 as needed to maintain SpO2 > 88%. - Agree with ambulatory desat study as anticipate that he will require home O2 at least short term. - Continue PT  efforts.  Tobacco dependence - he states that this admission was the final straw and that he is "now done with cigarettes". - Continue Nicotine patch. - Continue smoking cessation - strongly encouraged to pt and wife at bedside.  Rest per primary team.   Labs   CBC: Recent Labs  Lab 03/08/21 0752 03/09/21 0040 03/10/21 0330 03/11/21 0048 03/12/21 0055  WBC 11.3* 11.6* 10.1 10.6* 11.9*  HGB 14.3 14.6 14.5 14.5 14.8  HCT 41.9 43.3 42.4 41.9 42.8  MCV 91.9 91.7 91.2 89.9 90.7  PLT 200 223 202 205 694    Basic Metabolic Panel: Recent Labs  Lab 03/08/21 0752 03/09/21 0040 03/10/21 0330 03/11/21 0048 03/12/21 0055  NA 136 137 138 134* 136  K 4.3 3.9 4.2 3.6 3.6  CL 104 106 104 102 105  CO2 24 27 30 25 25   GLUCOSE 140* 129* 128* 124* 119*  BUN 30* 31* 24* 28* 27*  CREATININE 0.98 0.91 0.98 1.04 0.95  CALCIUM 8.5* 8.6* 8.4* 8.2* 8.3*   GFR: Estimated Creatinine Clearance: 74.9 mL/min (by C-G formula based on SCr of 0.95 mg/dL). Recent Labs  Lab 03/06/21 0231 03/07/21 0323 03/09/21 0040 03/10/21 0330 03/11/21 0048 03/12/21 0055  PROCALCITON <0.10  --   --   --   --   --   WBC 16.9*   < > 11.6* 10.1 10.6* 11.9*   < > = values in this interval not displayed.    Liver Function Tests: Recent Labs  Lab 03/08/21 0752 03/09/21 0040 03/10/21 0330 03/11/21 0048 03/12/21 0055  AST 45* 51* 36 32 26  ALT 61* 77* 68* 64* 61*  ALKPHOS 39 43 39 44 40  BILITOT 0.7 0.6 0.9 1.0 0.8  PROT 5.3* 5.6* 5.2* 5.1* 5.0*  ALBUMIN 2.7* 2.8* 2.6* 2.7* 2.6*   No results for input(s): LIPASE, AMYLASE in the last 168 hours. No results for input(s): AMMONIA in the last 168 hours.  ABG    Component Value Date/Time   HCO3 25.2 03/04/2021 1320   TCO2 26 03/04/2021 1320   ACIDBASEDEF 1.0 03/08/2020 1022   O2SAT 92.0 03/04/2021 1320     Coagulation Profile: No results for input(s): INR, PROTIME in the last 168 hours.  Cardiac Enzymes: No results for input(s): CKTOTAL, CKMB,  CKMBINDEX, TROPONINI in the last 168 hours.  HbA1C: No results found for: HGBA1C  CBG: No results for input(s): GLUCAP in the last 168 hours.  Review of Systems:   All negative; except for those that are bolded, which indicate positives.  Constitutional: weight loss, weight gain, night sweats, fevers, chills, fatigue, weakness.  HEENT: headaches, sore throat, sneezing, nasal congestion, post nasal drip, difficulty swallowing, tooth/dental problems, visual complaints, visual changes, ear aches. Neuro: difficulty with speech, weakness, numbness, ataxia. CV:  chest pain, orthopnea, PND, swelling in lower extremities, dizziness, palpitations, syncope.  Resp: cough, hemoptysis, dyspnea, wheezing. GI: heartburn, indigestion, abdominal pain, nausea, vomiting,  diarrhea, constipation, change in bowel habits, loss of appetite, hematemesis, melena, hematochezia.  GU: dysuria, change in color of urine, urgency or frequency, flank pain, hematuria. MSK: joint pain or swelling, decreased range of motion. Psych: change in mood or affect, depression, anxiety, suicidal ideations, homicidal ideations. Skin: rash, itching, bruising.   Past Medical History:  He,  has a past medical history of Allergies, Asthma, CAD in native artery (01/03/2020), COPD (chronic obstructive pulmonary disease) ([place]), COPD (chronic obstructive pulmonary disease) ([place]) (01/03/2020), Coughing, Fatigue, Neck swelling, Skin cancer, SOB (shortness of breath) on exertion, Tobacco use (01/03/2020), and Wheezing.   Surgical History:   Past Surgical History:  Procedure Laterality Date   BASAL CELL CARCINOMA EXCISION     NECK   CATARACT EXTRACTION     MOHS SURGERY  2013, 2019   ON SCALP AND LEFT ARM    RIGHT/LEFT HEART CATH AND CORONARY ANGIOGRAPHY N/A 03/08/2020   Procedure: RIGHT/LEFT HEART CATH AND CORONARY ANGIOGRAPHY;  Surgeon: Burnell Blanks, MD;  Location: [facility];  Service: Cardiovascular;  Laterality: N/A;      Social History:   reports that he has been smoking cigarettes. He has a 45.00 pack-year smoking history. He has never used smokeless tobacco.   Family History:  His family history includes Cancer in his father and mother; Cancer - Lung in his maternal aunt.   Allergies No Known Allergies   Home Medications  Prior to Admission medications   Medication Sig Start Date End Date Taking? Authorizing Provider  acetaminophen (TYLENOL) 500 MG tablet Take 500 mg by mouth every 6 (six) hours as needed for headache.     [provider]  albuterol (VENTOLIN HFA) 108 (90 Base) MCG/ACT inhaler Inhale 1-2 puffs into the lungs every 6 (six) hours as needed for wheezing or shortness of breath.     [provider]  Ascorbic Acid (VITAMIN C) 1000 MG tablet Take 1,000 mg by mouth daily.    [provider]  aspirin EC 81 MG tablet Take 1 tablet (81 mg total) by mouth daily. 01/04/20   [name], NP  atorvastatin (LIPITOR) 40 MG tablet TAKE 1 TABLET BY MOUTH EVERY DAY Patient taking differently: Take 40 mg by mouth daily. 12/16/20   [name], MD  BREZTRI AEROSPHERE 160-9-4.8 MCG/ACT AERO Take 2 puffs by mouth in the morning and at bedtime. 01/15/21   [provider]  carvedilol (COREG) 6.25 MG tablet TAKE 0.5 TABLETS (3.125 MG TOTAL) BY MOUTH 2 (TWO) TIMES DAILY WITH A MEAL. Patient taking differently: Take 3.125 mg by mouth 2 (two) times daily with a meal. 02/19/21   [name], MD  cetirizine (ZYRTEC) 10 MG tablet Take 10 mg by mouth daily.    [provider]  cholecalciferol (VITAMIN D3) 25 MCG (1000 UNIT) tablet Take 1,000 Units by mouth daily.    [provider]  ENTRESTO 97-103 MG TAKE 1 TABLET BY MOUTH 2 TIMES DAILY. Patient taking differently: Take 1 tablet by mouth 2 (two) times daily. 02/26/21   [name], MD  Multiple Vitamins-Minerals (CENTRUM SILVER 50+MEN) TABS Take 1 tablet by mouth daily.    [provider]   Omega-3 Fatty Acids (FISH OIL) 1000 MG CAPS Take 1,000 mg by mouth daily.     [provider]  prednisoLONE acetate (PRED FORTE) 1 % ophthalmic suspension Place into the left eye. 01/09/21   [provider]  SYMBICORT 160-4.5 MCG/ACT inhaler Inhale 2 puffs into the  lungs in the morning and at bedtime.  02/22/18   [provider]      Montey Hora, [place] For pager details, please see AMION or use Epic chat  After 1900, please call [facility] [place] for cross coverage needs 03/12/2021, 8:41 AM

## 2021-03-12 NOTE — Telephone Encounter (Signed)
Thanks guys 

## 2021-03-12 NOTE — Telephone Encounter (Signed)
Please schedule for outpatient follow up from hospitalization for acute COPD exacerbation.  Julian Hy, DO 03/12/21 1:35 PM [facility]

## 2021-03-13 LAB — CBC
HCT: 42.5 % (ref 39.0–52.0)
Hemoglobin: 14.7 g/dL (ref 13.0–17.0)
MCH: 31.3 pg (ref 26.0–34.0)
MCHC: 34.6 g/dL (ref 30.0–36.0)
MCV: 90.4 fL (ref 80.0–100.0)
Platelets: 219 10*3/uL (ref 150–400)
RBC: 4.7 MIL/uL (ref 4.22–5.81)
RDW: 13 % (ref 11.5–15.5)
WBC: 9.4 10*3/uL (ref 4.0–10.5)
nRBC: 0 % (ref 0.0–0.2)

## 2021-03-13 MED ORDER — BREZTRI AEROSPHERE 160-9-4.8 MCG/ACT IN AERO: 2.0000 | INHALATION_SPRAY | Freq: Two times a day (BID) | RESPIRATORY_TRACT | 1 refills | Status: AC

## 2021-03-13 MED ORDER — PREDNISONE 10 MG PO TABS: ORAL_TABLET | ORAL | 0 refills | Status: DC

## 2021-03-13 MED ORDER — BISOPROLOL FUMARATE 10 MG PO TABS: 10.0000 mg | ORAL_TABLET | Freq: Every day | ORAL | 1 refills | Status: DC

## 2021-03-13 MED ORDER — IPRATROPIUM-ALBUTEROL 0.5-2.5 (3) MG/3ML IN SOLN: 3.0000 mL | Freq: Three times a day (TID) | RESPIRATORY_TRACT | Status: DC

## 2021-03-13 MED ORDER — ALBUTEROL SULFATE (2.5 MG/3ML) 0.083% IN NEBU: 2.5000 mg | INHALATION_SOLUTION | RESPIRATORY_TRACT | 1 refills | Status: AC | PRN

## 2021-03-13 MED ORDER — FLUTICASONE PROPIONATE 50 MCG/ACT NA SUSP
2.0000 | Freq: Every day | NASAL | 1 refills | Status: DC
Start: 2021-03-14 — End: 2021-07-21

## 2021-03-13 MED ORDER — NICOTINE 21 MG/24HR TD PT24: 21.0000 mg | MEDICATED_PATCH | Freq: Every day | TRANSDERMAL | 1 refills | Status: DC

## 2021-03-13 MED ORDER — BENZONATATE 200 MG PO CAPS: 200.0000 mg | ORAL_CAPSULE | Freq: Three times a day (TID) | ORAL | 0 refills | Status: DC | PRN

## 2021-03-13 MED ORDER — LORATADINE 10 MG PO TABS: 10.0000 mg | ORAL_TABLET | Freq: Every day | ORAL | 1 refills | Status: DC

## 2021-03-13 NOTE — TOC Transition Note (Signed)
Transition of Care [facility]) - CM/SW Discharge Note   Patient Details  Name: Mark Tate MRN: [id number] Date of Birth: 07-30-1951  [facility]) CM/SW Contact:  Verdell Carmine, RN Phone Number: 03/13/2021, 12:06 PM   Clinical Narrative:     Nebulizer ordered for patient via [employer]. Home health and other DME established. Patient discharging today.   Final next level of care: Home w Home Health Services Barriers to Discharge: No Barriers Identified   Patient Goals and CMS Choice Patient states their goals for this hospitalization and ongoing recovery are:: get home   Choice offered to / list presented to : Patient  Discharge Placement                       Discharge Plan and Services   Discharge Planning Services: CM Consult Post Acute Care Choice: Durable Medical Equipment, Home Health          DME Arranged: Nebulizer machine DME Agency: [employer] Date DME Agency Contacted: 03/13/21 Time DME Agency Contacted: [id number] Representative spoke with at DME Agency: Freda Munro HH Arranged: PT, OT, NA [place] Agency: [place] Date [place]: 03/11/21 Time [place]: 41 Representative spoke with at [place]: [place] ([place]) Interventions     Readmission Risk Interventions No flowsheet data found.

## 2021-03-13 NOTE — Discharge Summary (Addendum)
PATIENT DETAILS Name: Mark Tate Age: 70 y.o. Sex: male Date of Birth: 05/11/1951 MRN: [id number]. Admitting Physician: Albertine Patricia, MD OZD:[id number], L.Marlou Sa, MD  Admit Date: 03/04/2021 Discharge date: 03/13/2021  Recommendations for Outpatient Follow-up:  Follow up with PCP in 1-2 weeks Please obtain CMP/CBC in one week Being discharged on home O2-reassess at next visit Continue counseling regarding importance of abstaining completely from tobacco use May require repeat PFTs Has known 3 mm lung nodule-needs updated CT scans.  Admitted From:  Home  Disposition: Home with home health services   Home Health: Yes  Equipment/Devices: Oxygen 2L  Discharge Condition: Stable  CODE STATUS: FULL CODE  Diet recommendation:  Diet Order             Diet - low sodium heart healthy           Diet Heart Room service appropriate? Yes; Fluid consistency: Thin  Diet effective now                    Brief Narrative: Patient is a 70 y.o. male with history of COPD, chronic systolic heart failure (EF improved on last echo) presented with 3-4-day history of cough/worsening shortness of breath-found to have acute hypoxic respiratory failure requiring BiPAP support due to COPD exacerbation.   Significant events: 6/7>> admit for COPD exacerbation-on BiPAP 6/8>> off BiPAP-transitioned to 5 L via nasal cannula   Significant studies: 01/01/2020>> Echo: EF 30-35% 04/22/2020>> Echo: EF 60-65% 03/04/2021>> CXR: No obvious pneumonia 03/07/21>> CXR: No obvious consolidation-chronic interstitial thickening. 03/08/2021>> Echo: EF 60-65%   Antimicrobial therapy: Rocephin: 6/7>> 6/11 Zithromax: 6/7>> 6/11   Microbiology data: 6/7>> COVID PCR: Negative   Procedures : None   Consults: PCCM  Brief Hospital Course: Acute hypoxic respiratory failure due to COPD exacerbation: Required BiPAP on admission-has been gradually tapered down to around 2-3 L.  Has been treated with  steroids/bronchodilators/empiric IV antimicrobial therapy and as needed doses of IV Lasix.  This morning-feels significantly better-hardly any wheezing-and is anxious to go home.  He does have history of mild COPD-and his prior PFTs-but has a 2 pack-a-day history of smoking-and likely this is worsened since his prior PFTs.  He will be discharged home on a inhaler regimen-and home O2-and tapering prednisone.  An outpatient appointment with pulmonology has been arranged.   AKI: Likely hemodynamically mediated due to diuretics-resolved.   Hyponatremia: Likely due to diuretics-resolved.   History of chronic combined systolic/diastolic heart failure: Most recent echo showed improved EF-no evidence of volume overload-was given a few doses of IV Lasix to maintain negative balance.   History of nonobstructive CAD: No anginal symptoms-continue aspirin/statin/Entresto-given diffuse/severe wheezing on admission-Coreg was held-since his bronchospasm has markedly improved-he has been switched to bisoprolol (more selective given bronchospasm)   Tobacco abuse: Counseled-continue transdermal nicotine.  Seems pretty determined to stop smoking.   History of 3 mm lung nodule: Stable for outpatient follow-up by his pulmonologist.  Discharge Diagnoses:  Active Problems:   CAD in native artery   Tobacco use   COPD exacerbation [facility])   Discharge Instructions:  Activity:  As tolerated   Discharge Instructions     Diet - low sodium heart healthy   Complete by: As directed    Discharge instructions   Complete by: As directed    Follow with Primary MD  Alroy Dust, L.Marlou Sa, MD in 1-2 weeks  Please get a complete blood count and chemistry panel checked by your Primary MD at your next visit, and again  as instructed by your Primary MD.  Get Medicines reviewed and adjusted: Please take all your medications with you for your next visit with your Primary MD  Laboratory/radiological data: Please request your  Primary MD to go over all hospital tests and procedure/radiological results at the follow up, please ask your Primary MD to get all Hospital records sent to his/her office.  In some cases, they will be blood work, cultures and biopsy results pending at the time of your discharge. Please request that your primary care M.D. follows up on these results.  Also Note the following: If you experience worsening of your admission symptoms, develop shortness of breath, life threatening emergency, suicidal or homicidal thoughts you must seek medical attention immediately by calling 911 or calling your MD immediately  if symptoms less severe.  You must read complete instructions/literature along with all the possible adverse reactions/side effects for all the Medicines you take and that have been prescribed to you. Take any new Medicines after you have completely understood and accpet all the possible adverse reactions/side effects.   Do not drive when taking Pain medications or sleeping medications (Benzodaizepines)  Do not take more than prescribed Pain, Sleep and Anxiety Medications. It is not advisable to combine anxiety,sleep and pain medications without talking with your primary care practitioner  Special Instructions: If you have smoked or chewed Tobacco  in the last 2 yrs please stop smoking, stop any regular Alcohol  and or any Recreational drug use.  Wear Seat belts while driving.  Please note: You were cared for by a hospitalist during your hospital stay. Once you are discharged, your primary care physician will handle any further medical issues. Please note that NO REFILLS for any discharge medications will be authorized once you are discharged, as it is imperative that you return to your primary care physician (or establish a relationship with a primary care physician if you do not have one) for your post hospital discharge needs so that they can reassess your need for medications and monitor your  lab values.   STOP SMOKING   Increase activity slowly   Complete by: As directed       Allergies as of 03/13/2021   No Known Allergies      Medication List     STOP taking these medications    carvedilol 6.25 MG tablet Commonly known as: COREG   cetirizine 10 MG tablet Commonly known as: ZYRTEC   Symbicort 160-4.5 MCG/ACT inhaler Generic drug: budesonide-formoterol       TAKE these medications    acetaminophen 500 MG tablet Commonly known as: TYLENOL Take 500 mg by mouth every 6 (six) hours as needed for headache.   albuterol 108 (90 Base) MCG/ACT inhaler Commonly known as: VENTOLIN HFA Inhale 1-2 puffs into the lungs every 6 (six) hours as needed for wheezing or shortness of breath. What changed: Another medication with the same name was added. Make sure you understand how and when to take each.   albuterol (2.5 MG/3ML) 0.083% nebulizer solution Commonly known as: PROVENTIL Take 3 mLs (2.5 mg total) by nebulization every 4 (four) hours as needed for wheezing or shortness of breath. What changed: You were already taking a medication with the same name, and this prescription was added. Make sure you understand how and when to take each.   aspirin EC 81 MG tablet Take 1 tablet (81 mg total) by mouth daily.   atorvastatin 40 MG tablet Commonly known as: LIPITOR TAKE 1 TABLET BY  MOUTH EVERY DAY   benzonatate 200 MG capsule Commonly known as: TESSALON Take 1 capsule (200 mg total) by mouth 3 (three) times daily as needed for cough.   bisoprolol 10 MG tablet Commonly known as: ZEBETA Take 1 tablet (10 mg total) by mouth daily. Start taking on: March 14, 2021   Breztri Aerosphere 160-9-4.8 MCG/ACT Aero Generic drug: Budeson-Glycopyrrol-Formoterol Take 2 puffs by mouth in the morning and at bedtime.   Centrum Silver 50+Men Tabs Take 1 tablet by mouth daily.   cholecalciferol 25 MCG (1000 UNIT) tablet Commonly known as: VITAMIN D3 Take 1,000 Units by mouth  daily.   Entresto 97-103 MG Generic drug: sacubitril-valsartan TAKE 1 TABLET BY MOUTH 2 TIMES DAILY.   Fish Oil 1000 MG Caps Take 1,000 mg by mouth daily.   fluticasone 50 MCG/ACT nasal spray Commonly known as: FLONASE Place 2 sprays into both nostrils daily. Start taking on: March 14, 2021   loratadine 10 MG tablet Commonly known as: CLARITIN Take 1 tablet (10 mg total) by mouth daily. Start taking on: March 14, 2021   nicotine 21 mg/24hr patch Commonly known as: NICODERM CQ - dosed in mg/24 hours Place 1 patch (21 mg total) onto the skin daily. Start taking on: March 14, 2021   prednisoLONE acetate 1 % ophthalmic suspension Commonly known as: PRED FORTE Place into the left eye.   predniSONE 10 MG tablet Commonly known as: DELTASONE Take 40 mg daily for 3 day, 30 mg daily for 3 day, 20 mg daily for  3 days,10 mg daily for 1 day, then stop   vitamin C 1000 MG tablet Take 1,000 mg by mouth daily.               Durable Medical Equipment  (From admission, onward)           Start     Ordered   03/12/21 1351  For home use only DME Nebulizer machine  Once       Question Answer Comment  Patient needs a nebulizer to treat with the following condition COPD (chronic obstructive pulmonary disease) ([place])   Length of Need Lifetime      03/12/21 1350   03/12/21 0737  For home use only DME oxygen  Once       Question Answer Comment  Length of Need 6 Months   Mode or (Route) Nasal cannula   Liters per Minute 2   Frequency Continuous (stationary and portable oxygen unit needed)   Oxygen conserving device Yes   Oxygen delivery system Gas      03/12/21 0736   03/11/21 1233  For home use only DME Other see comment  Once       Comments: Patient needs rollator  Question:  Length of Need  Answer:  Lifetime   03/11/21 1329            Follow-up Information     [facility] [place] Follow up.   Specialty: [place] Why: A representative from [facility] will contact you to arrange start date and time for your therapy Contact information: [address] [place] [address] [phone]         Magdalen Spatz, NP Follow up on 03/26/2021.   Specialty: Pulmonary Disease Why: appointment at 2 pm., Hospital follow up Contact information: [place]  [id number] [phone]  No Known Allergies   Other Procedures/Studies: DG Chest 2 View  Result Date: 03/04/2021 CLINICAL DATA:  Worsening shortness of breath since Friday, cough, history COPD EXAM: CHEST - 2 VIEW COMPARISON:  01/12/2014 FINDINGS: Normal heart size, mediastinal contours, and pulmonary vascularity. Mildly tortuous thoracic aorta. Emphysematous and bronchitic changes consistent with COPD. Minimal diffuse interstitial changes in the mid to lower lungs with bibasilar subsegmental atelectasis. No acute infiltrate, pleural effusion, or pneumothorax. Bones demineralized. IMPRESSION: COPD changes with interstitial prominence at lung bases. No acute abnormalities. Electronically Signed   By: Lavonia Dana M.D.   On: 03/04/2021 10:57   DG CHEST PORT 1 VIEW  Result Date: 03/08/2021 CLINICAL DATA:  Shortness of breath EXAM: PORTABLE CHEST 1 VIEW COMPARISON:  03/07/2021 FINDINGS: There is hyperinflation of the lungs compatible with COPD. Heart and mediastinal contours are within normal limits. No focal opacities or effusions. No acute bony abnormality. IMPRESSION: COPD.  No active disease. Electronically Signed   By: Rolm Baptise M.D.   On: 03/08/2021 21:40   DG Chest Port 1V same Day  Result Date: 03/07/2021 CLINICAL DATA:  Shortness of breath EXAM: PORTABLE CHEST 1 VIEW COMPARISON:  March 04, 2021 FINDINGS: Interstitial thickening is stable. No edema or airspace opacity. The heart size is normal. Pulmonary vascularity is within normal limits and stable. No adenopathy. No bone lesions. IMPRESSION: Interstitial thickening,  likely due to a degree of underlying chronic bronchitis. No edema or consolidation. Stable cardiac silhouette. Electronically Signed   By: Lowella Grip III M.D.   On: 03/07/2021 09:30   ECHOCARDIOGRAM COMPLETE  Result Date: 03/09/2021    ECHOCARDIOGRAM REPORT   Patient Name:   Mark Tate Date of Exam: 03/09/2021 Medical Rec #:  [id number]     Height:       71.0 in Accession #:    [id number]    Weight:       180.0 lb Date of Birth:  Feb 18, 1951      BSA:          2.016 m Patient Age:    36 years      BP:           137/108 mmHg Patient Gender: M             HR:           111 bpm. Exam Location:  Inpatient Procedure: 2D Echo, Cardiac Doppler and Color Doppler Indications:    Acute respiratory distress R06.3  History:        Patient has prior history of Echocardiogram examinations, most                 recent 04/22/2020.  Sonographer:    Merrie Roof RDCS Referring Phys: [place]  1. Left ventricular ejection fraction, by estimation, is 60 to 65%. The left ventricle has normal function. Left ventricular endocardial border not optimally defined to evaluate regional wall motion. Indeterminate diastolic filling due to E-A fusion.  2. Right ventricular systolic function was not well visualized. The right ventricular size is normal. Tricuspid regurgitation signal is inadequate for assessing PA pressure.  3. The mitral valve was not well visualized. No evidence of mitral valve regurgitation. No evidence of mitral stenosis.  4. The aortic valve was not assessed. Aortic valve regurgitation is not visualized.  5. The inferior vena cava is dilated in size with >50% respiratory variability, suggesting right atrial pressure of 8 mmHg. FINDINGS  Left Ventricle: Left ventricular ejection fraction, by  estimation, is 60 to 65%. The left ventricle has normal function. Left ventricular endocardial border not optimally defined to evaluate regional wall motion. The left ventricular internal cavity size was  normal in size. There is no left ventricular hypertrophy. Indeterminate diastolic filling due to E-A fusion. Normal left ventricular filling pressure. Right Ventricle: The right ventricular size is normal. No increase in right ventricular wall thickness. Right ventricular systolic function was not well visualized. Tricuspid regurgitation signal is inadequate for assessing PA pressure. Left Atrium: Left atrial size was not well visualized. Right Atrium: Right atrial size was not well visualized. Pericardium: There is no evidence of pericardial effusion. Mitral Valve: The mitral valve was not well visualized. No evidence of mitral valve regurgitation. No evidence of mitral valve stenosis. Tricuspid Valve: The tricuspid valve is not well visualized. Tricuspid valve regurgitation is not demonstrated. No evidence of tricuspid stenosis. Aortic Valve: The aortic valve was not assessed. Aortic valve regurgitation is not visualized. Aortic valve mean gradient measures 2.0 mmHg. Aortic valve peak gradient measures 4.7 mmHg. Pulmonic Valve: The pulmonic valve was not assessed. Pulmonic valve regurgitation is not visualized. No evidence of pulmonic stenosis. Aorta: Aortic root could not be assessed. Venous: The inferior vena cava is dilated in size with greater than 50% respiratory variability, suggesting right atrial pressure of 8 mmHg. IAS/Shunts: No atrial level shunt detected by color flow Doppler.   Diastology LV e' medial:    6.42 cm/s LV E/e' medial:  10.6 LV e' lateral:   7.18 cm/s LV E/e' lateral: 9.5  IVC IVC diam: 2.10 cm LEFT ATRIUM           Index LA Vol (A4C): 29.0 ml 14.38 ml/m  AORTIC VALVE AV Vmax:           108.00 cm/s AV Vmean:          70.200 cm/s AV VTI:            0.179 m AV Peak Grad:      4.7 mmHg AV Mean Grad:      2.0 mmHg LVOT Vmax:         92.00 cm/s LVOT Vmean:        54.900 cm/s LVOT VTI:          0.158 m LVOT/AV VTI ratio: 0.88 MITRAL VALVE MV Area (PHT): 5.31 cm    SHUNTS MV Decel Time: 143  msec    Systemic VTI: 0.16 m MV E velocity: 68.10 cm/s MV A velocity: 92.10 cm/s MV E/A ratio:  0.74 Fransico Him MD Electronically signed by Fransico Him MD Signature Date/Time: 03/09/2021/8:09:53 PM    Final      TODAY-DAY OF DISCHARGE:  Subjective:   Merleen Milliner today has no headache,no chest abdominal pain,no new weakness tingling or numbness, feels much better wants to go home today.   Objective:   Blood pressure (!) 135/94, pulse 78, temperature 98 F (36.7 C), temperature source Oral, resp. rate 20, height 5\' 11"  (1.803 m), weight 81.6 kg, SpO2 93 %.  Intake/Output Summary (Last 24 hours) at 03/13/2021 1108 Last data filed at 03/13/2021 0917 Gross per 24 hour  Intake 480 ml  Output 1200 ml  Net -720 ml   Filed Weights   03/04/21 1758  Weight: 81.6 kg    Exam: Awake Alert, Oriented *3, No new F.N deficits, Normal affect [place].AT,PERRAL Supple Neck,No JVD, No cervical lymphadenopathy appriciated.  Symmetrical Chest wall movement, Good air movement bilaterally, CTAB RRR,No Gallops,Rubs or new Murmurs, No Parasternal  Heave +ve B.Sounds, Abd Soft, Non tender, No organomegaly appriciated, No rebound -guarding or rigidity. No Cyanosis, Clubbing or edema, No new Rash or bruise   PERTINENT RADIOLOGIC STUDIES: No results found.   PERTINENT LAB RESULTS: CBC: Recent Labs    03/12/21 0055 03/13/21 0030  WBC 11.9* 9.4  HGB 14.8 14.7  HCT 42.8 42.5  PLT 209 219   CMET CMP     Component Value Date/Time   NA 136 03/12/2021 0055   NA 139 02/27/2020 0852   K 3.6 03/12/2021 0055   CL 105 03/12/2021 0055   CO2 25 03/12/2021 0055   GLUCOSE 119 (H) 03/12/2021 0055   BUN 27 (H) 03/12/2021 0055   BUN 14 02/27/2020 0852   CREATININE 0.95 03/12/2021 0055   CALCIUM 8.3 (L) 03/12/2021 0055   PROT 5.0 (L) 03/12/2021 0055   PROT 6.3 02/12/2020 1053   ALBUMIN 2.6 (L) 03/12/2021 0055   ALBUMIN 4.1 02/12/2020 1053   AST 26 03/12/2021 0055   ALT 61 (H) 03/12/2021 0055   ALKPHOS  40 03/12/2021 0055   BILITOT 0.8 03/12/2021 0055   BILITOT 0.6 02/12/2020 1053   GFRNONAA >60 03/12/2021 0055   GFRAA 79 02/27/2020 0852    GFR Estimated Creatinine Clearance: 74.9 mL/min (by C-G formula based on SCr of 0.95 mg/dL). No results for input(s): LIPASE, AMYLASE in the last 72 hours. No results for input(s): CKTOTAL, CKMB, CKMBINDEX, TROPONINI in the last 72 hours. Invalid input(s): POCBNP No results for input(s): DDIMER in the last 72 hours. No results for input(s): HGBA1C in the last 72 hours. No results for input(s): CHOL, HDL, LDLCALC, TRIG, CHOLHDL, LDLDIRECT in the last 72 hours. No results for input(s): TSH, T4TOTAL, T3FREE, THYROIDAB in the last 72 hours.  Invalid input(s): FREET3 No results for input(s): VITAMINB12, FOLATE, FERRITIN, TIBC, IRON, RETICCTPCT in the last 72 hours. Coags: No results for input(s): INR in the last 72 hours.  Invalid input(s): PT Microbiology: Recent Results (from the past 240 hour(s))  Resp Panel by RT-PCR (Flu A&B, Covid) Nasopharyngeal Swab     Status: None   Collection Time: 03/04/21 10:13 AM   Specimen: Nasopharyngeal Swab; Nasopharyngeal(NP) swabs in vial transport medium  Result Value Ref Range Status   SARS Coronavirus 2 by RT PCR NEGATIVE NEGATIVE Final    Comment: (NOTE) SARS-CoV-2 target nucleic acids are NOT DETECTED.  The SARS-CoV-2 RNA is generally detectable in upper respiratory specimens during the acute phase of infection. The lowest concentration of SARS-CoV-2 viral copies this assay can detect is 138 copies/mL. A negative result does not preclude SARS-Cov-2 infection and should not be used as the sole basis for treatment or other patient management decisions. A negative result may occur with  improper specimen collection/handling, submission of specimen other than nasopharyngeal swab, presence of viral mutation(s) within the areas targeted by this assay, and inadequate number of viral copies(<138 copies/mL). A  negative result must be combined with clinical observations, patient history, and epidemiological information. The expected result is Negative.  Fact Sheet for Patients:  EntrepreneurPulse.com.au  Fact Sheet for Healthcare Providers:  IncredibleEmployment.be  This test is no t yet approved or cleared by the [place] FDA and  has been authorized for detection and/or diagnosis of SARS-CoV-2 by FDA under an Emergency Use Authorization (EUA). This EUA will remain  in effect (meaning this test can be used) for the duration of the COVID-19 declaration under Section 564(b)(1) of the Act, 21 U.S.C.section 360bbb-3(b)(1), unless the authorization is terminated  or revoked sooner.       Influenza A by PCR NEGATIVE NEGATIVE Final   Influenza B by PCR NEGATIVE NEGATIVE Final    Comment: (NOTE) The Xpert Xpress SARS-CoV-2/FLU/RSV plus assay is intended as an aid in the diagnosis of influenza from Nasopharyngeal swab specimens and should not be used as a sole basis for treatment. Nasal washings and aspirates are unacceptable for Xpert Xpress SARS-CoV-2/FLU/RSV testing.  Fact Sheet for Patients: EntrepreneurPulse.com.au  Fact Sheet for Healthcare Providers: IncredibleEmployment.be  This test is not yet approved or cleared by the [place] FDA and has been authorized for detection and/or diagnosis of SARS-CoV-2 by FDA under an Emergency Use Authorization (EUA). This EUA will remain in effect (meaning this test can be used) for the duration of the COVID-19 declaration under Section 564(b)(1) of the Act, 21 U.S.C. section 360bbb-3(b)(1), unless the authorization is terminated or revoked.  Performed at [facility], [place] [address], [place], [place] [address]   MRSA PCR Screening     Status: None   Collection Time: 03/05/21  6:28 AM   Specimen: Nasal Mucosa; Nasopharyngeal  Result Value Ref Range Status    MRSA by PCR NEGATIVE NEGATIVE Final    Comment:        The GeneXpert MRSA Assay (FDA approved for NASAL specimens only), is one component of a comprehensive MRSA colonization surveillance program. It is not intended to diagnose MRSA infection nor to guide or monitor treatment for MRSA infections. Performed at [facility], [place] [address], [place], [place] [address]     FURTHER DISCHARGE INSTRUCTIONS:  Get Medicines reviewed and adjusted: Please take all your medications with you for your next visit with your Primary MD  Laboratory/radiological data: Please request your Primary MD to go over all hospital tests and procedure/radiological results at the follow up, please ask your Primary MD to get all Hospital records sent to his/her office.  In some cases, they will be blood work, cultures and biopsy results pending at the time of your discharge. Please request that your primary care M.D. goes through all the records of your hospital data and follows up on these results.  Also Note the following: If you experience worsening of your admission symptoms, develop shortness of breath, life threatening emergency, suicidal or homicidal thoughts you must seek medical attention immediately by calling 911 or calling your MD immediately  if symptoms less severe.  You must read complete instructions/literature along with all the possible adverse reactions/side effects for all the Medicines you take and that have been prescribed to you. Take any new Medicines after you have completely understood and accpet all the possible adverse reactions/side effects.   Do not drive when taking Pain medications or sleeping medications (Benzodaizepines)  Do not take more than prescribed Pain, Sleep and Anxiety Medications. It is not advisable to combine anxiety,sleep and pain medications without talking with your primary care practitioner  Special Instructions: If you have smoked or chewed Tobacco  in  the last 2 yrs please stop smoking, stop any regular Alcohol  and or any Recreational drug use.  Wear Seat belts while driving.  Please note: You were cared for by a hospitalist during your hospital stay. Once you are discharged, your primary care physician will handle any further medical issues. Please note that NO REFILLS for any discharge medications will be authorized once you are discharged, as it is imperative that you return to your primary care physician (or establish a relationship with  a primary care physician if you do not have one) for your post hospital discharge needs so that they can reassess your need for medications and monitor your lab values.  Total Time spent coordinating discharge including counseling, education and face to face time equals 35 minutes.  SignedOren Binet 03/13/2021 11:08 AM

## 2021-03-13 NOTE — Progress Notes (Signed)
Pt given discharge instructions, prescriptions, and care notes. Pt verbalized understanding AEB no further questions or concerns at this time. IV was discontinued, no redness, pain, or swelling noted at this time. Telemetry discontinued and Centralized Telemetry was notified. Home O2 and nebulizer brought to room, explained to Pt and wife how to use. Rolator walker also delivered. Pt left the floor via wheelchair with staff in stable condition.

## 2021-03-13 NOTE — Care Management Important Message (Signed)
Important Message  Patient Details  Name: Mark Tate MRN: [id number] Date of Birth: [birth year]   Medicare Important Message Given:  Yes - Important Message mailed due to current National Emergency   Verbal consent obtained due to current National Emergency  Relationship to patient: Self Contact Name: Keiondre Call Date: 03/13/21  Time: 1537 Phone: [phone] Outcome: Spoke with contact Important Message mailed to: Patient address on file    Delorse Lek 03/13/2021, 3:38 PM

## 2021-03-13 NOTE — Progress Notes (Signed)
Physical Therapy Treatment Patient Details Name: Mark Tate MRN: [id number] DOB: [birth year] Today's Date: 03/13/2021    History of Present Illness 70yo male admitted 6/7 with c/o SOB and DOE after exposure to a Covid + family member 5 days ago. Covid negative. Hypoxic to 85% in ED and required 4LPM O2. Found to have acute hypoxic/hypercarbic respiratory failure with COPD exacerbation. PMH CAD, COPD, SOB, tobacco use    PT Comments    Pt doing well with mobility. Ready for dc home from PT standpoint.    Follow Up Recommendations  Home health PT     Equipment Recommendations   (Rollator)    Recommendations for Other Services       Precautions / Restrictions Precautions Precautions: Other (comment);Fall Precaution Comments: watch O2    Mobility  Bed Mobility               General bed mobility comments: Pt up in chair    Transfers Overall transfer level: Modified independent Equipment used: None Transfers: Sit to/from Stand Sit to Stand: Modified independent (Device/Increase time)         General transfer comment: Steady to rise  Ambulation/Gait Ambulation/Gait assistance: Modified independent (Device/Increase time) Gait Distance (Feet): 200 Feet Assistive device: 4-wheeled walker Gait Pattern/deviations: Step-through pattern;Decreased stride length Gait velocity: decreased Gait velocity interpretation: 1.31 - 2.62 ft/sec, indicative of limited community ambulator General Gait Details: Steady gait with rollator   Stairs             Wheelchair Mobility    Modified Rankin (Stroke Patients Only)       Balance Overall balance assessment: Needs assistance Sitting-balance support: No upper extremity supported;Feet supported Sitting balance-Leahy Scale: Good     Standing balance support: No upper extremity supported Standing balance-Leahy Scale: Fair                              Cognition Arousal/Alertness:  Awake/alert Behavior During Therapy: WFL for tasks assessed/performed Overall Cognitive Status: Within Functional Limits for tasks assessed                                        Exercises      General Comments General comments (skin integrity, edema, etc.): SpO2 93% with amb on 2L      Pertinent Vitals/Pain      Home Living                      Prior Function            PT Goals (current goals can now be found in the care plan section) Acute Rehab PT Goals Patient Stated Goal: to go home Progress towards PT goals: Progressing toward goals    Frequency    Min 3X/week      PT Plan Current plan remains appropriate    Co-evaluation              AM-PAC PT "6 Clicks" Mobility   Outcome Measure  Help needed turning from your back to your side while in a flat bed without using bedrails?: None Help needed moving from lying on your back to sitting on the side of a flat bed without using bedrails?: None Help needed moving to and from a bed to a chair (including a wheelchair)?: None Help needed standing up from a  chair using your arms (e.g., wheelchair or bedside chair)?: None Help needed to walk in hospital room?: None Help needed climbing 3-5 steps with a railing? : A Little 6 Click Score: 23    End of Session Equipment Utilized During Treatment: Oxygen Activity Tolerance: Patient tolerated treatment well Patient left: with call bell/phone within reach;with family/visitor present;in chair Nurse Communication: Mobility status PT Visit Diagnosis: Muscle weakness (generalized) (M62.81);Difficulty in walking, not elsewhere classified (R26.2)     Time: 1484-0397 PT Time Calculation (min) (ACUTE ONLY): 16 min  Charges:  $Gait Training: 8-22 mins                     [place] Pager [phone] Office [place] 03/13/2021, 1:42 PM

## 2021-03-14 DIAGNOSIS — J449 Chronic obstructive pulmonary disease, unspecified: Secondary | ICD-10-CM | POA: Diagnosis not present

## 2021-03-14 DIAGNOSIS — R531 Weakness: Secondary | ICD-10-CM | POA: Diagnosis not present

## 2021-03-18 DIAGNOSIS — I2510 Atherosclerotic heart disease of native coronary artery without angina pectoris: Secondary | ICD-10-CM | POA: Diagnosis not present

## 2021-03-18 DIAGNOSIS — I5042 Chronic combined systolic (congestive) and diastolic (congestive) heart failure: Secondary | ICD-10-CM | POA: Diagnosis not present

## 2021-03-18 DIAGNOSIS — J9602 Acute respiratory failure with hypercapnia: Secondary | ICD-10-CM | POA: Diagnosis not present

## 2021-03-18 DIAGNOSIS — J9601 Acute respiratory failure with hypoxia: Secondary | ICD-10-CM | POA: Diagnosis not present

## 2021-03-18 DIAGNOSIS — J441 Chronic obstructive pulmonary disease with (acute) exacerbation: Secondary | ICD-10-CM | POA: Diagnosis not present

## 2021-03-19 DIAGNOSIS — I2510 Atherosclerotic heart disease of native coronary artery without angina pectoris: Secondary | ICD-10-CM | POA: Diagnosis not present

## 2021-03-19 DIAGNOSIS — J9602 Acute respiratory failure with hypercapnia: Secondary | ICD-10-CM | POA: Diagnosis not present

## 2021-03-19 DIAGNOSIS — J9601 Acute respiratory failure with hypoxia: Secondary | ICD-10-CM | POA: Diagnosis not present

## 2021-03-19 DIAGNOSIS — I5042 Chronic combined systolic (congestive) and diastolic (congestive) heart failure: Secondary | ICD-10-CM | POA: Diagnosis not present

## 2021-03-19 DIAGNOSIS — J441 Chronic obstructive pulmonary disease with (acute) exacerbation: Secondary | ICD-10-CM | POA: Diagnosis not present

## 2021-03-25 DIAGNOSIS — I2510 Atherosclerotic heart disease of native coronary artery without angina pectoris: Secondary | ICD-10-CM | POA: Diagnosis not present

## 2021-03-25 DIAGNOSIS — J449 Chronic obstructive pulmonary disease, unspecified: Secondary | ICD-10-CM | POA: Diagnosis not present

## 2021-03-25 DIAGNOSIS — J441 Chronic obstructive pulmonary disease with (acute) exacerbation: Secondary | ICD-10-CM | POA: Diagnosis not present

## 2021-03-26 ENCOUNTER — Encounter: Payer: Self-pay | Admitting: Acute Care

## 2021-03-26 ENCOUNTER — Other Ambulatory Visit: Payer: Self-pay

## 2021-03-26 ENCOUNTER — Ambulatory Visit: Payer: Medicare Other | Admitting: Acute Care

## 2021-03-26 VITALS — BP 122/82 | HR 73 | Temp 97.3°F | Ht 69.0 in | Wt 179.4 lb

## 2021-03-26 DIAGNOSIS — J441 Chronic obstructive pulmonary disease with (acute) exacerbation: Secondary | ICD-10-CM | POA: Diagnosis not present

## 2021-03-26 DIAGNOSIS — R5381 Other malaise: Secondary | ICD-10-CM

## 2021-03-26 DIAGNOSIS — I5042 Chronic combined systolic (congestive) and diastolic (congestive) heart failure: Secondary | ICD-10-CM | POA: Diagnosis not present

## 2021-03-26 DIAGNOSIS — J9602 Acute respiratory failure with hypercapnia: Secondary | ICD-10-CM | POA: Diagnosis not present

## 2021-03-26 DIAGNOSIS — I2510 Atherosclerotic heart disease of native coronary artery without angina pectoris: Secondary | ICD-10-CM | POA: Diagnosis not present

## 2021-03-26 DIAGNOSIS — Z720 Tobacco use: Secondary | ICD-10-CM

## 2021-03-26 DIAGNOSIS — F17210 Nicotine dependence, cigarettes, uncomplicated: Secondary | ICD-10-CM

## 2021-03-26 DIAGNOSIS — J9601 Acute respiratory failure with hypoxia: Secondary | ICD-10-CM | POA: Diagnosis not present

## 2021-03-26 MED ORDER — BREZTRI AEROSPHERE 160-9-4.8 MCG/ACT IN AERO: 2.0000 | INHALATION_SPRAY | Freq: Two times a day (BID) | RESPIRATORY_TRACT | 0 refills | Status: DC

## 2021-03-26 NOTE — Patient Instructions (Addendum)
Breztri 2 puffs twice daily  Rinse mouth after use Albuterol rescue inhaler 2 puffs every 4 hours as needed for breakthrough symptoms  Titrate oxygen for sats > 88% Note your daily symptoms > remember "red flags" for COPD:  Increase in cough, increase in sputum production, increase in shortness of breath or activity intolerance. If you notice these symptoms, please call to be seen early.  Do not wait until you cannot breath.  Optimize allergic rhinitis control-agree with Claritin and Flonase daily.  Please use spacer with your inhaler Please purchase an oxygen saturation monitor. [employer] has these for a reasonable price. Try to wean oxygen to 1 Liter while you are watching TV.  Make sure your oxygen saturation is < 90 % at all times.   Ensure that pneumonia, flu, COVID vaccines are up-to-date  PFT's once he is at his new baseline Please use spacer with your inhaler Please purchase an oxygen saturation monitor. Try to wean oxygen to 1 Liter while you are watching TV.  Make sure your oxygen saturation is < 90 % at all times.  Congratulations on quitting smoking. Keep up the great work.  I have given you a card for free nicotine patches. You will drop to the 14 mg patch size Please contact office for sooner follow up if symptoms do not improve or worsen or seek emergency care

## 2021-03-26 NOTE — Progress Notes (Signed)
History of Present Illness Mark Tate is a 70 y.o. male every day smoker ( 90 pack year smoking history) with COPD. He was seen as an inpatient for an exacerbation 6/7-6/16/2022. He is followed by Dr. Chase Caller. ( Last OV 03/2018)  Hospital follow up for COPD flare. Pt  endorsed ongoing tobacco abuse at the time of admission who had 1 week of upper respiratory symptoms and worsening shortness of breath prior to admission on 6/7 He was treated with oxygen, azithromycin, steroids, and bronchodilators. He did require Bipap. Home maintenance is Breztri. He did not have a rescue inhaler, but has subsequently been prescribed one. Prior to admission he was smoking 2 PPD.  He has not smoked since admission. He is currently using 21 mg nicotine patches.    Admit Date: 03/04/2021 Discharge date: 03/13/2021   03/28/2021  Pt. Presents for hospital follow up. He was admitted 03/04/2021-03/13/2021 with exacerbation. He was treated with azithromycin, steroids, and bronchodilators. He did require Bipap.He has quit smoking . Last cigarette was 03/04/2021. He is using the nicotine patches. He is wearing oxygen at 2 L with portable tank and 3 L with home tubing. Oxygen saturations were 97% on 2 L after ambulating into the room. We have discussed weaning oxygen just while he is at rest , and only if he has an oxygen saturation monitor to ensure his saturations are > 88% at all times. He has used his rescue inhaler x 2 since discharge. He states he does have chest congestion. He is using his IS and flutter valve. He is not using mucinex to thin secretions. He is willing to try this. He is working with Physical therapy. We discussed pulmonary rehab once he completes his home PT. He is using a walker. He is compliant with his Breztri 2 puffs in the morning and 2 puffs in the evening.He feels the nebulizer does help, and we have dicussed using this in the morning and the evening until he feels better. He is compliant with Flonase and  claritin for rhinitis control optimization  There is a significant need for education. Both patient and his wife will benefit from this. I am very encouraged that he has quit smoking. Nicotime patches are working. We discussed weaning from the 21 mg to the 14 mg to the 7 mg patches. He will need reinforcement of this, and support.  He denies any fever, no purulent secretions. States he feels much better, but is not back to baseline. He states he does have wheezing at intervals.Secretions are clear with slight yellow tinge.  Immunizations Covid vaccines and booster x 1[employer]) 11/30/2019, 12/26/2019, 11/14/2020 Pneumonia vaccine Prevnar 13>> 04/12/2014, Prevnar 23>> 04/22/2015  Test Results: CT chest, lung cancer screening 05/23/2019 reviewed-significant emphysema, upper lobe predominant.  03/08/2021 CXR-hyperinflated, increased bibasilar markings but no discrete opacities.  Labs reviewed.  Bicarb 25 WBC 11.9 H/H 14.8/42.8  Echocardiogram 03/09/21-LVEF 60 to 65%, indeterminate diastolic function.  RV not well visualized.  Dilated IVC with greater than 50% respiratory variability.  LA and RA not well seen.  PFT 04/14/2018-mild obstruction without bronchodilator reversibility, FEV1 87% of predicted.  Mildly reduced total lung capacity-TLC 73% of predicted. DLCO moderately reduced, 58% predicted.   Micro Data: Flu 6/15 > neg. SARS CoV2  6/15 > neg.   Antibiotics: Azithromycin 6/7 > 6/11. Ceftriaxone 6/7 > 6/11.     CBC Latest Ref Rng & Units 03/13/2021 03/12/2021 03/11/2021  WBC 4.0 - 10.5 K/uL 9.4 11.9(H) 10.6(H)  Hemoglobin 13.0 -  17.0 g/dL 14.7 14.8 14.5  Hematocrit 39.0 - 52.0 % 42.5 42.8 41.9  Platelets 150 - 400 K/uL 219 209 205    BMP Latest Ref Rng & Units 03/12/2021 03/11/2021 03/10/2021  Glucose 70 - 99 mg/dL 119(H) 124(H) 128(H)  BUN 8 - 23 mg/dL 27(H) 28(H) 24(H)  Creatinine 0.61 - 1.24 mg/dL 0.95 1.04 0.98  BUN/Creat Ratio 10 - 24 - - -  Sodium 135 - 145 mmol/L 136 134(L) 138   Potassium 3.5 - 5.1 mmol/L 3.6 3.6 4.2  Chloride 98 - 111 mmol/L 105 102 104  CO2 22 - 32 mmol/L 25 25 30   Calcium 8.9 - 10.3 mg/dL 8.3(L) 8.2(L) 8.4(L)    BNP    Component Value Date/Time   BNP 54.3 03/08/2021 2058    ProBNP    Component Value Date/Time   PROBNP 180 01/22/2020 0917    PFT    Component Value Date/Time   FEV1PRE 2.51 04/14/2018 1000   FEV1POST 2.52 04/14/2018 1000   FVCPRE 3.79 04/14/2018 1000   FVCPOST 3.88 04/14/2018 1000   TLC 4.69 04/14/2018 1000   DLCOUNC 16.56 04/14/2018 1000   PREFEV1FVCRT 66 04/14/2018 1000   PSTFEV1FVCRT 65 04/14/2018 1000    DG Chest 2 View  Result Date: 03/04/2021 CLINICAL DATA:  Worsening shortness of breath since Friday, cough, history COPD EXAM: CHEST - 2 VIEW COMPARISON:  01/12/2014 FINDINGS: Normal heart size, mediastinal contours, and pulmonary vascularity. Mildly tortuous thoracic aorta. Emphysematous and bronchitic changes consistent with COPD. Minimal diffuse interstitial changes in the mid to lower lungs with bibasilar subsegmental atelectasis. No acute infiltrate, pleural effusion, or pneumothorax. Bones demineralized. IMPRESSION: COPD changes with interstitial prominence at lung bases. No acute abnormalities. Electronically Signed   By: Lavonia Dana M.D.   On: 03/04/2021 10:57   DG CHEST PORT 1 VIEW  Result Date: 03/08/2021 CLINICAL DATA:  Shortness of breath EXAM: PORTABLE CHEST 1 VIEW COMPARISON:  03/07/2021 FINDINGS: There is hyperinflation of the lungs compatible with COPD. Heart and mediastinal contours are within normal limits. No focal opacities or effusions. No acute bony abnormality. IMPRESSION: COPD.  No active disease. Electronically Signed   By: Rolm Baptise M.D.   On: 03/08/2021 21:40   DG Chest Port 1V same Day  Result Date: 03/07/2021 CLINICAL DATA:  Shortness of breath EXAM: PORTABLE CHEST 1 VIEW COMPARISON:  March 04, 2021 FINDINGS: Interstitial thickening is stable. No edema or airspace opacity. The heart  size is normal. Pulmonary vascularity is within normal limits and stable. No adenopathy. No bone lesions. IMPRESSION: Interstitial thickening, likely due to a degree of underlying chronic bronchitis. No edema or consolidation. Stable cardiac silhouette. Electronically Signed   By: Lowella Grip III M.D.   On: 03/07/2021 09:30   ECHOCARDIOGRAM COMPLETE  Result Date: 03/09/2021    ECHOCARDIOGRAM REPORT   Patient Name:   KARANVIR BALDERSTON Date of Exam: 03/09/2021 Medical Rec #:  [id number]     Height:       71.0 in Accession #:    [id number]    Weight:       180.0 lb Date of Birth:  1951-03-14      BSA:          2.016 m Patient Age:    78 years      BP:           137/108 mmHg Patient Gender: M             HR:  111 bpm. Exam Location:  Inpatient Procedure: 2D Echo, Cardiac Doppler and Color Doppler Indications:    Acute respiratory distress R06.3  History:        Patient has prior history of Echocardiogram examinations, most                 recent 04/22/2020.  Sonographer:    Merrie Roof RDCS Referring Phys: [place]  1. Left ventricular ejection fraction, by estimation, is 60 to 65%. The left ventricle has normal function. Left ventricular endocardial border not optimally defined to evaluate regional wall motion. Indeterminate diastolic filling due to E-A fusion.  2. Right ventricular systolic function was not well visualized. The right ventricular size is normal. Tricuspid regurgitation signal is inadequate for assessing PA pressure.  3. The mitral valve was not well visualized. No evidence of mitral valve regurgitation. No evidence of mitral stenosis.  4. The aortic valve was not assessed. Aortic valve regurgitation is not visualized.  5. The inferior vena cava is dilated in size with >50% respiratory variability, suggesting right atrial pressure of 8 mmHg. FINDINGS  Left Ventricle: Left ventricular ejection fraction, by estimation, is 60 to 65%. The left ventricle has normal  function. Left ventricular endocardial border not optimally defined to evaluate regional wall motion. The left ventricular internal cavity size was normal in size. There is no left ventricular hypertrophy. Indeterminate diastolic filling due to E-A fusion. Normal left ventricular filling pressure. Right Ventricle: The right ventricular size is normal. No increase in right ventricular wall thickness. Right ventricular systolic function was not well visualized. Tricuspid regurgitation signal is inadequate for assessing PA pressure. Left Atrium: Left atrial size was not well visualized. Right Atrium: Right atrial size was not well visualized. Pericardium: There is no evidence of pericardial effusion. Mitral Valve: The mitral valve was not well visualized. No evidence of mitral valve regurgitation. No evidence of mitral valve stenosis. Tricuspid Valve: The tricuspid valve is not well visualized. Tricuspid valve regurgitation is not demonstrated. No evidence of tricuspid stenosis. Aortic Valve: The aortic valve was not assessed. Aortic valve regurgitation is not visualized. Aortic valve mean gradient measures 2.0 mmHg. Aortic valve peak gradient measures 4.7 mmHg. Pulmonic Valve: The pulmonic valve was not assessed. Pulmonic valve regurgitation is not visualized. No evidence of pulmonic stenosis. Aorta: Aortic root could not be assessed. Venous: The inferior vena cava is dilated in size with greater than 50% respiratory variability, suggesting right atrial pressure of 8 mmHg. IAS/Shunts: No atrial level shunt detected by color flow Doppler.   Diastology LV e' medial:    6.42 cm/s LV E/e' medial:  10.6 LV e' lateral:   7.18 cm/s LV E/e' lateral: 9.5  IVC IVC diam: 2.10 cm LEFT ATRIUM           Index LA Vol (A4C): 29.0 ml 14.38 ml/m  AORTIC VALVE AV Vmax:           108.00 cm/s AV Vmean:          70.200 cm/s AV VTI:            0.179 m AV Peak Grad:      4.7 mmHg AV Mean Grad:      2.0 mmHg LVOT Vmax:         92.00 cm/s  LVOT Vmean:        54.900 cm/s LVOT VTI:          0.158 m LVOT/AV VTI ratio: 0.88 MITRAL VALVE MV Area (PHT): 5.31  cm    SHUNTS MV Decel Time: 143 msec    Systemic VTI: 0.16 m MV E velocity: 68.10 cm/s MV A velocity: 92.10 cm/s MV E/A ratio:  0.74 Fransico Him MD Electronically signed by Fransico Him MD Signature Date/Time: 03/09/2021/8:09:53 PM    Final      Past medical hx Past Medical History:  Diagnosis Date   Allergies    Asthma    CAD in native artery 01/03/2020   COPD (chronic obstructive pulmonary disease) (HCC)    COPD (chronic obstructive pulmonary disease) ([place]) 01/03/2020   Coughing    Fatigue    Neck swelling    Skin cancer    SOB (shortness of breath) on exertion    Tobacco use 01/03/2020   Wheezing      Social History   Tobacco Use   Smoking status: Former    Packs/day: 2.00    Years: 45.00    Pack years: 90.00    Types: Cigarettes    Quit date: 02/2021    Years since quitting: 0.0   Smokeless tobacco: Never  Vaping Use   Vaping Use: Never used    Mr.Lanuza reports that he quit smoking about 4 weeks ago. His smoking use included cigarettes. He has a 90.00 pack-year smoking history. He has never used smokeless tobacco.  Tobacco Cessation: Current every day smoker as of 6/7 ( Day of admission). Is currently using 21 mg nicotine patches. He has not smoked since admission. I have congratulated him and told him to keep up the good work.  Past surgical hx, Family hx, Social hx all reviewed.  Current Outpatient Medications on File Prior to Visit  Medication Sig   acetaminophen (TYLENOL) 500 MG tablet Take 500 mg by mouth every 6 (six) hours as needed for headache.    albuterol (PROVENTIL) (2.5 MG/3ML) 0.083% nebulizer solution Take 3 mLs (2.5 mg total) by nebulization every 4 (four) hours as needed for wheezing or shortness of breath.   albuterol (VENTOLIN HFA) 108 (90 Base) MCG/ACT inhaler Inhale 1-2 puffs into the lungs every 6 (six) hours as needed for wheezing or  shortness of breath.    Ascorbic Acid (VITAMIN C) 1000 MG tablet Take 1,000 mg by mouth daily.   Ascorbic Acid (VITAMIN C) 1000 MG tablet 1 tablet   aspirin EC 81 MG tablet Take 1 tablet (81 mg total) by mouth daily.   atorvastatin (LIPITOR) 40 MG tablet TAKE 1 TABLET BY MOUTH EVERY DAY   benzonatate (TESSALON) 200 MG capsule Take 1 capsule (200 mg total) by mouth 3 (three) times daily as needed for cough.   bisoprolol (ZEBETA) 10 MG tablet Take 1 tablet (10 mg total) by mouth daily.   BREZTRI AEROSPHERE 160-9-4.8 MCG/ACT AERO Take 2 puffs by mouth in the morning and at bedtime.   carvedilol (COREG) 6.25 MG tablet 1/2 tablet   cholecalciferol (VITAMIN D3) 25 MCG (1000 UNIT) tablet Take 1,000 Units by mouth daily.   fluticasone (FLONASE) 50 MCG/ACT nasal spray Place 2 sprays into both nostrils daily.   fluticasone (FLONASE) 50 MCG/ACT nasal spray 1 spray in each nostril   loratadine (CLARITIN) 10 MG tablet Take 1 tablet (10 mg total) by mouth daily.   loratadine (CLARITIN) 10 MG tablet 1 tablet   Multiple Vitamins-Minerals (CENTRUM SILVER 50+MEN) TABS Take 1 tablet by mouth daily.   nicotine (NICODERM CQ - DOSED IN MG/24 HOURS) 21 mg/24hr patch Place 1 patch (21 mg total) onto the skin daily.   Omega-3 Fatty  Acids (FISH OIL) 1000 MG CAPS Take 1,000 mg by mouth daily.    prednisoLONE acetate (PRED FORTE) 1 % ophthalmic suspension Place into the left eye.   predniSONE (DELTASONE) 10 MG tablet Take 40 mg daily for 3 day, 30 mg daily for 3 day, 20 mg daily for  3 days,10 mg daily for 1 day, then stop   ENTRESTO 97-103 MG TAKE 1 TABLET BY MOUTH 2 TIMES DAILY. (Patient not taking: Reported on 03/26/2021)   No current facility-administered medications on file prior to visit.     No Known Allergies  Review Of Systems:  Constitutional:   No  weight loss, night sweats,  Fevers, chills, +fatigue, or  lassitude.  HEENT:   No headaches,  Difficulty swallowing,  Tooth/dental problems, or  Sore throat,                 No sneezing, itching, ear ache, nasal congestion, post nasal drip,   CV:  No chest pain,  Orthopnea, PND, trace swelling in lower extremities, anasarca, dizziness, palpitations, syncope.   GI  No heartburn, indigestion, abdominal pain, nausea, vomiting, diarrhea, change in bowel habits, loss of appetite, bloody stools.   Resp:+ shortness of breath with exertion less at rest.  Scant  excess mucus, no productive cough,  + baseline  non-productive cough,  No coughing up of blood.  No change in color of mucus.  + wheezing.  No chest wall deformity  Skin: no rash or lesions.  GU: no dysuria, change in color of urine, no urgency or frequency.  No flank pain, no hematuria   MS:  No joint pain or swelling.  + decreased range of motion.  No back pain.  Psych:  No change in mood or affect. No depression or anxiety.  No memory loss.   Vital Signs BP 122/82 (BP Location: Right Arm, Cuff Size: Normal)   Pulse 73   Temp (!) 97.3 F (36.3 C) (Oral)   Ht 5\' 9"  (1.753 m)   Wt 179 lb 6.4 oz (81.4 kg)   SpO2 97%   BMI 26.49 kg/m    Physical Exam:  General- No distress,  A&Ox3, pleasant ENT: No sinus tenderness, TM clear, pale nasal mucosa, no oral exudate,no post nasal drip, no LAN Cardiac: S1, S2, regular rate and rhythm, no murmur Chest: No wheeze/ rales/ dullness; no accessory muscle use, no nasal flaring, no sternal retractions, diminished per bases Abd.: Soft Non-tender, ND, BS +, Body mass index is 26.49 kg/m. Ext: No clubbing cyanosis, trace edema Neuro:  normal strength, MAE x 4, A&O x 3, Body mass index is 26.49 kg/m. Skin: No rashes, warm and dry Psych: normal mood and behavior.   Assessment/Plan Acute hypoxic respiratory failure due to acute COPD exacerbation >> resolving after inpatient admission Plan Breztri 2 puffs twice daily  Rinse mouth after use Albuterol rescue inhaler 2 puffs every 4 hours as needed for breakthrough symptoms  Titrate oxygen for sats  > 88% Note your daily symptoms > remember "red flags" for COPD:  Increase in cough, increase in sputum production, increase in shortness of breath or activity intolerance. If you notice these symptoms, please call to be seen early.  Do not wait until you cannot breath.  Optimize allergic rhinitis control-agree with Claritin and Flonase daily.  Ensure that pneumonia, flu, COVID vaccines are up-to-date  PFT's once he is at his new baseline Please use spacer with your inhaler Please purchase an oxygen saturation monitor. [employer] has these for a  reasonable price. Try to wean oxygen to 1 Liter while you are watching TV.  Make sure your oxygen saturation is < 90 % at all times. Pneumonia vaccine Prevnar 13>> 04/12/2014, Prevnar 23>> 04/22/2015  Tobacco Abuse Plan Smoking cessation counseling done Continue working on quitting smoking I have given you a card for free nicotine patches. You will drop to the 14 mg patch size Needs ongoing outpatient screening for lung cancer >> please place referral Nicotine replacement therapy with appropriate wean.  Follow up in 1 month with Judson Roch NP or Dr. Chase Caller  >> we will do a walk and we will see if he has returned to his baseline We will do PFT's when back to baseline  Physical Deconditioning Plan Continue to work with home physical therapy Consider Pulmonary Rehab once home PT has concluded   I spent 50 minutes dedicated to the care of this patient on the date of this encounter to include pre-visit review of records, face-to-face time with the patient discussing conditions above, post visit ordering of testing, clinical documentation with the electronic health record, making appropriate referrals as documented, and communicating necessary information to the patient's healthcare team.   Magdalen Spatz, NP 03/28/2021  5:33 PM

## 2021-03-27 NOTE — Progress Notes (Signed)
   03/04/21 2033  Assess: MEWS Score  Temp 98.7 F (37.1 C)  BP (!) 135/95  Pulse Rate (!) 115  ECG Heart Rate (!) 110  Resp (!) 25  SpO2 90 %  O2 Device Bi-PAP  FiO2 (%) 50 %  Assess: MEWS Score  MEWS Temp 0  MEWS Systolic 0  MEWS Pulse 1  MEWS RR 1  MEWS LOC 0  MEWS Score 2  MEWS Score Color Yellow  Assess: if the MEWS score is Yellow or Red  Were vital signs taken at a resting state? Yes  Focused Assessment No change from prior assessment  Early Detection of Sepsis Score *See Row Information* High  MEWS guidelines implemented *See Row Information* Yes  Treat  MEWS Interventions Escalated (See documentation below)  Pain Scale 0-10  Pain Score 0  Take Vital Signs  Increase Vital Sign Frequency  Yellow: Q 2hr X 2 then Q 4hr X 2, if remains yellow, continue Q 4hrs  Escalate  MEWS: Escalate Yellow: discuss with charge nurse/RN and consider discussing with provider and RRT  Notify: Charge Nurse/RN  Name of Charge Nurse/RN Notified self  Date Charge Nurse/RN Notified 03/04/21  Time Charge Nurse/RN Notified 2033

## 2021-03-28 ENCOUNTER — Encounter: Payer: Self-pay | Admitting: Acute Care

## 2021-03-28 DIAGNOSIS — I5042 Chronic combined systolic (congestive) and diastolic (congestive) heart failure: Secondary | ICD-10-CM | POA: Diagnosis not present

## 2021-03-28 DIAGNOSIS — J9602 Acute respiratory failure with hypercapnia: Secondary | ICD-10-CM | POA: Diagnosis not present

## 2021-03-28 DIAGNOSIS — I2510 Atherosclerotic heart disease of native coronary artery without angina pectoris: Secondary | ICD-10-CM | POA: Diagnosis not present

## 2021-03-28 DIAGNOSIS — J441 Chronic obstructive pulmonary disease with (acute) exacerbation: Secondary | ICD-10-CM | POA: Diagnosis not present

## 2021-03-28 DIAGNOSIS — J9601 Acute respiratory failure with hypoxia: Secondary | ICD-10-CM | POA: Diagnosis not present

## 2021-04-02 DIAGNOSIS — J441 Chronic obstructive pulmonary disease with (acute) exacerbation: Secondary | ICD-10-CM | POA: Diagnosis not present

## 2021-04-02 DIAGNOSIS — J9601 Acute respiratory failure with hypoxia: Secondary | ICD-10-CM | POA: Diagnosis not present

## 2021-04-02 DIAGNOSIS — J9602 Acute respiratory failure with hypercapnia: Secondary | ICD-10-CM | POA: Diagnosis not present

## 2021-04-02 DIAGNOSIS — I5042 Chronic combined systolic (congestive) and diastolic (congestive) heart failure: Secondary | ICD-10-CM | POA: Diagnosis not present

## 2021-04-02 DIAGNOSIS — I2510 Atherosclerotic heart disease of native coronary artery without angina pectoris: Secondary | ICD-10-CM | POA: Diagnosis not present

## 2021-04-03 DIAGNOSIS — J441 Chronic obstructive pulmonary disease with (acute) exacerbation: Secondary | ICD-10-CM | POA: Diagnosis not present

## 2021-04-03 DIAGNOSIS — J9601 Acute respiratory failure with hypoxia: Secondary | ICD-10-CM | POA: Diagnosis not present

## 2021-04-03 DIAGNOSIS — I2510 Atherosclerotic heart disease of native coronary artery without angina pectoris: Secondary | ICD-10-CM | POA: Diagnosis not present

## 2021-04-03 DIAGNOSIS — I5042 Chronic combined systolic (congestive) and diastolic (congestive) heart failure: Secondary | ICD-10-CM | POA: Diagnosis not present

## 2021-04-03 DIAGNOSIS — J9602 Acute respiratory failure with hypercapnia: Secondary | ICD-10-CM | POA: Diagnosis not present

## 2021-04-08 DIAGNOSIS — I5042 Chronic combined systolic (congestive) and diastolic (congestive) heart failure: Secondary | ICD-10-CM | POA: Diagnosis not present

## 2021-04-08 DIAGNOSIS — J9602 Acute respiratory failure with hypercapnia: Secondary | ICD-10-CM | POA: Diagnosis not present

## 2021-04-08 DIAGNOSIS — J9601 Acute respiratory failure with hypoxia: Secondary | ICD-10-CM | POA: Diagnosis not present

## 2021-04-08 DIAGNOSIS — J441 Chronic obstructive pulmonary disease with (acute) exacerbation: Secondary | ICD-10-CM | POA: Diagnosis not present

## 2021-04-08 DIAGNOSIS — I2510 Atherosclerotic heart disease of native coronary artery without angina pectoris: Secondary | ICD-10-CM | POA: Diagnosis not present

## 2021-04-12 DIAGNOSIS — R531 Weakness: Secondary | ICD-10-CM | POA: Diagnosis not present

## 2021-04-12 DIAGNOSIS — J449 Chronic obstructive pulmonary disease, unspecified: Secondary | ICD-10-CM | POA: Diagnosis not present

## 2021-04-13 DIAGNOSIS — J449 Chronic obstructive pulmonary disease, unspecified: Secondary | ICD-10-CM | POA: Diagnosis not present

## 2021-04-13 DIAGNOSIS — R531 Weakness: Secondary | ICD-10-CM | POA: Diagnosis not present

## 2021-04-15 DIAGNOSIS — I2510 Atherosclerotic heart disease of native coronary artery without angina pectoris: Secondary | ICD-10-CM | POA: Diagnosis not present

## 2021-04-15 DIAGNOSIS — J441 Chronic obstructive pulmonary disease with (acute) exacerbation: Secondary | ICD-10-CM | POA: Diagnosis not present

## 2021-04-15 DIAGNOSIS — I5042 Chronic combined systolic (congestive) and diastolic (congestive) heart failure: Secondary | ICD-10-CM | POA: Diagnosis not present

## 2021-04-15 DIAGNOSIS — J9601 Acute respiratory failure with hypoxia: Secondary | ICD-10-CM | POA: Diagnosis not present

## 2021-04-15 DIAGNOSIS — J9602 Acute respiratory failure with hypercapnia: Secondary | ICD-10-CM | POA: Diagnosis not present

## 2021-04-22 ENCOUNTER — Ambulatory Visit (INDEPENDENT_AMBULATORY_CARE_PROVIDER_SITE_OTHER): Payer: Medicare Other

## 2021-04-22 ENCOUNTER — Ambulatory Visit: Payer: Medicare Other | Admitting: Primary Care

## 2021-04-22 ENCOUNTER — Ambulatory Visit (INDEPENDENT_AMBULATORY_CARE_PROVIDER_SITE_OTHER): Payer: Medicare Other | Admitting: Internal Medicine

## 2021-04-22 ENCOUNTER — Other Ambulatory Visit: Payer: Self-pay

## 2021-04-22 ENCOUNTER — Encounter: Payer: Self-pay | Admitting: Primary Care

## 2021-04-22 VITALS — BP 120/80 | HR 64 | Ht 69.0 in | Wt 189.0 lb

## 2021-04-22 DIAGNOSIS — J441 Chronic obstructive pulmonary disease with (acute) exacerbation: Secondary | ICD-10-CM

## 2021-04-22 DIAGNOSIS — J9611 Chronic respiratory failure with hypoxia: Secondary | ICD-10-CM | POA: Diagnosis not present

## 2021-04-22 DIAGNOSIS — Z87891 Personal history of nicotine dependence: Secondary | ICD-10-CM

## 2021-04-22 DIAGNOSIS — J449 Chronic obstructive pulmonary disease, unspecified: Secondary | ICD-10-CM

## 2021-04-22 DIAGNOSIS — J9610 Chronic respiratory failure, unspecified whether with hypoxia or hypercapnia: Secondary | ICD-10-CM | POA: Diagnosis not present

## 2021-04-22 LAB — PULMONARY FUNCTION TEST
DL/VA % pred: 55 %
DL/VA: 2.27 ml/min/mmHg/L
DLCO cor % pred: 60 %
DLCO cor: 14.05 ml/min/mmHg
DLCO unc % pred: 60 %
DLCO unc: 14.09 ml/min/mmHg
FEF 25-75 Post: 1.11 L/sec
FEF 25-75 Pre: 1.18 L/sec
FEF2575-%Change-Post: -6 %
FEF2575-%Pred-Post: 52 %
FEF2575-%Pred-Pre: 56 %
FEV1-%Change-Post: -1 %
FEV1-%Pred-Post: 81 %
FEV1-%Pred-Pre: 82 %
FEV1-Post: 2.27 L
FEV1-Pre: 2.3 L
FEV1FVC-%Change-Post: 0 %
FEV1FVC-%Pred-Pre: 88 %
FEV6-%Change-Post: 0 %
FEV6-%Pred-Post: 95 %
FEV6-%Pred-Pre: 95 %
FEV6-Post: 3.44 L
FEV6-Pre: 3.43 L
FEV6FVC-%Change-Post: 1 %
FEV6FVC-%Pred-Post: 104 %
FEV6FVC-%Pred-Pre: 102 %
FVC-%Change-Post: 0 %
FVC-%Pred-Post: 92 %
FVC-%Pred-Pre: 92 %
FVC-Post: 3.54 L
FVC-Pre: 3.56 L
Post FEV1/FVC ratio: 64 %
Post FEV6/FVC ratio: 98 %
Pre FEV1/FVC ratio: 65 %
Pre FEV6/FVC Ratio: 96 %
RV % pred: 150 %
RV: 3.5 L
TLC % pred: 113 %
TLC: 7.26 L

## 2021-04-22 NOTE — Progress Notes (Signed)
$'@Patient'L$  ID: Mark Tate, male    DOB: 1951/08/06, 70 y.o.   MRN: [id number]  Chief Complaint  Patient presents with   Follow-up    PFT review.     Referring provider: Wenda Low, MD  HPI: 70 year old male, former smoker quit in June 2022 (90-pack-year history).  Past medical history significant for COPD.  Patient of Dr. Chase Caller last seen in office on 03/26/2021 by pulmonary nurse practitioner for COPD exacerbation. He was seen as an inpatient for an exacerbation 03/04/21-03/13/2021.   Previous [facility] pulmonary encounter:  6/29/22Elie Confer, NP/ Hospital follow-up Hospital follow up for COPD flare. Pt  endorsed ongoing tobacco abuse at the time of admission who had 1 week of upper respiratory symptoms and worsening shortness of breath prior to admission on 6/7 He was treated with oxygen, azithromycin, steroids, and bronchodilators. He did require Bipap. Home maintenance is Breztri. He did not have a rescue inhaler, but has subsequently been prescribed one. Prior to admission he was smoking 2 PPD.  He has not smoked since admission. He is currently using 21 mg nicotine patches.    Pt. Presents for hospital follow up. He was admitted 03/04/2021-03/13/2021 with exacerbation. He was treated with azithromycin, steroids, and bronchodilators. He did require Bipap.He has quit smoking . Last cigarette was 03/04/2021. He is using the nicotine patches. He is wearing oxygen at 2 L with portable tank and 3 L with home tubing. Oxygen saturations were 97% on 2 L after ambulating into the room. We have discussed weaning oxygen just while he is at rest , and only if he has an oxygen saturation monitor to ensure his saturations are > 88% at all times. He has used his rescue inhaler x 2 since discharge. He states he does have chest congestion. He is using his IS and flutter valve. He is not using mucinex to thin secretions. He is willing to try this. He is working with Physical therapy. We discussed pulmonary rehab once he  completes his home PT. He is using a walker. He is compliant with his Breztri 2 puffs in the morning and 2 puffs in the evening.He feels the nebulizer does help, and we have dicussed using this in the morning and the evening until he feels better. He is compliant with Flonase and claritin for rhinitis control optimization  There is a significant need for education. Both patient and his wife will benefit from this. I am very encouraged that he has quit smoking. Nicotime patches are working. We discussed weaning from the 21 mg to the 14 mg to the 7 mg patches. He will need reinforcement of this, and support.  He denies any fever, no purulent secretions. States he feels much better, but is not back to baseline. He states he does have wheezing at intervals.Secretions are clear with slight yellow tinge.   04/22/2021- Interim hx  Patient presents today for 1 month follow-up with PFTs. Accompanied by his wife. He is doing well today. No acute complaints. He was admitted in June for COPD exacerbation. He has been on oxygen since discharge, he was walked in office today and was able to maintain O2 level >94% on room air. He is compliant with Breztri Aerosphere 2 puffs twice daily. Pulmonary function testing today showed mild obstructive airway disease with moderate diffusion defect. These remain stable since last PFTs in 2019. CXR in June showed interstitial thickening felt to be related to chronic bronchitis.      Test Results: CT chest, lung cancer  screening 05/23/2019 reviewed-significant emphysema, upper lobe predominant.  03/08/2021 CXR-hyperinflated, increased bibasilar markings but no discrete opacities.  Labs reviewed.  Bicarb 25 WBC 11.9 H/H 14.8/42.8  Echocardiogram 03/09/21-LVEF 60 to 65%, indeterminate diastolic function.  RV not well visualized.  Dilated IVC with greater than 50% respiratory variability.  LA and RA not well seen.  PFT 04/14/2018-mild obstruction without bronchodilator reversibility,  FEV1 87% of predicted.  Mildly reduced total lung capacity-TLC 73% of predicted. DLCO moderately reduced, 58% predicted.  PFT-04/22/21-FVC 3.54 (92%), FEV1 2.27 (81%), ratio 64, TLC 113%, DLCOcor 14.05 (60%)   Past medical hx Past Medical History:  Diagnosis Date   Allergies    Asthma    CAD in native artery 01/03/2020     No Known Allergies  Immunization History  Administered Date(s) Administered   Influenza Split 07/27/2012, 07/09/2017, 07/26/2018   PFIZER(Purple Top)SARS-COV-2 Vaccination 11/30/2019, 12/26/2019, 11/14/2020   Pneumococcal Conjugate-13 04/12/2014   Pneumococcal Polysaccharide-23 04/22/2015   Td 11/26/2004   Tdap 05/12/2011   Zoster, Live 08/23/2012, 06/04/2019, 08/16/2019    Past Medical History:  Diagnosis Date   Allergies    Asthma    CAD in native artery 01/03/2020   COPD (chronic obstructive pulmonary disease) (HCC)    COPD (chronic obstructive pulmonary disease) ([place]) 01/03/2020   Coughing    Fatigue    Neck swelling    Skin cancer    SOB (shortness of breath) on exertion    Tobacco use 01/03/2020   Wheezing     Tobacco History: Social History   Tobacco Use  Smoking Status Former   Packs/day: 2.00   Years: 45.00   Pack years: 90.00   Types: Cigarettes   Quit date: 02/2021   Years since quitting: 0.1  Smokeless Tobacco Never   Counseling given: Not Answered   Outpatient Medications Prior to Visit  Medication Sig Dispense Refill   acetaminophen (TYLENOL) 500 MG tablet Take 500 mg by mouth every 6 (six) hours as needed for headache.      albuterol (PROVENTIL) (2.5 MG/3ML) 0.083% nebulizer solution Take 3 mLs (2.5 mg total) by nebulization every 4 (four) hours as needed for wheezing or shortness of breath. 75 mL 1   albuterol (VENTOLIN HFA) 108 (90 Base) MCG/ACT inhaler Inhale 1-2 puffs into the lungs every 6 (six) hours as needed for wheezing or shortness of breath.      Ascorbic Acid (VITAMIN C) 1000 MG tablet Take 1,000 mg by mouth daily.      Ascorbic Acid (VITAMIN C) 1000 MG tablet 1 tablet     aspirin EC 81 MG tablet Take 1 tablet (81 mg total) by mouth daily. 90 tablet 3   atorvastatin (LIPITOR) 40 MG tablet TAKE 1 TABLET BY MOUTH EVERY DAY 90 tablet 3   benzonatate (TESSALON) 200 MG capsule Take 1 capsule (200 mg total) by mouth 3 (three) times daily as needed for cough. 20 capsule 0   bisoprolol (ZEBETA) 10 MG tablet Take 1 tablet (10 mg total) by mouth daily. 30 tablet 1   BREZTRI AEROSPHERE 160-9-4.8 MCG/ACT AERO Take 2 puffs by mouth in the morning and at bedtime. 10.7 g 1   carvedilol (COREG) 6.25 MG tablet 1/2 tablet     cholecalciferol (VITAMIN D3) 25 MCG (1000 UNIT) tablet Take 1,000 Units by mouth daily.     ENTRESTO 97-103 MG TAKE 1 TABLET BY MOUTH 2 TIMES DAILY. 60 tablet 8   fluticasone (FLONASE) 50 MCG/ACT nasal spray Place 2 sprays into both nostrils daily. [place]  g 1   fluticasone (FLONASE) 50 MCG/ACT nasal spray 1 spray in each nostril     loratadine (CLARITIN) 10 MG tablet Take 1 tablet (10 mg total) by mouth daily. 30 tablet 1   loratadine (CLARITIN) 10 MG tablet 1 tablet     Multiple Vitamins-Minerals (CENTRUM SILVER 50+MEN) TABS Take 1 tablet by mouth daily.     nicotine (NICODERM CQ - DOSED IN MG/24 HOURS) 21 mg/24hr patch Place 1 patch (21 mg total) onto the skin daily. 28 patch 1   Omega-3 Fatty Acids (FISH OIL) 1000 MG CAPS Take 1,000 mg by mouth daily.      prednisoLONE acetate (PRED FORTE) 1 % ophthalmic suspension Place into the left eye.     Budeson-Glycopyrrol-Formoterol (BREZTRI AEROSPHERE) 160-9-4.8 MCG/ACT AERO Inhale 2 puffs into the lungs in the morning and at bedtime. (Patient not taking: Reported on 04/22/2021) 4.8 g 0   predniSONE (DELTASONE) 10 MG tablet Take 40 mg daily for 3 day, 30 mg daily for 3 day, 20 mg daily for  3 days,10 mg daily for 1 day, then stop (Patient not taking: Reported on 04/22/2021) 28 tablet 0   No facility-administered medications prior to visit.    Review of  Systems  Review of Systems  Constitutional: Negative.   HENT: Negative.    Respiratory: Negative.  Negative for cough, chest tightness and shortness of breath.   Cardiovascular: Negative.     Physical Exam  BP 120/80 (BP Location: Left Arm, Patient Position: Sitting, Cuff Size: Normal)   Pulse 64   Ht '5\' 9"'$  (1.753 m)   Wt 189 lb (85.7 kg)   SpO2 97% Comment: 2 liters.  BMI 27.91 kg/m  Physical Exam Constitutional:      General: He is not in acute distress.    Appearance: Normal appearance. He is not ill-appearing.  HENT:     Head: Normocephalic and atraumatic.     Mouth/Throat:     Mouth: Mucous membranes are moist.     Pharynx: Oropharynx is clear.  Cardiovascular:     Rate and Rhythm: Normal rate and regular rhythm.  Pulmonary:     Effort: Pulmonary effort is normal.     Breath sounds: Normal breath sounds. No wheezing, rhonchi or rales.  Musculoskeletal:        General: Normal range of motion.  Skin:    General: Skin is warm and dry.  Neurological:     General: No focal deficit present.     Mental Status: He is alert and oriented to person, place, and time. Mental status is at baseline.  Psychiatric:        Mood and Affect: Mood normal.        Behavior: Behavior normal.        Thought Content: Thought content normal.        Judgment: Judgment normal.     Lab Results:  CBC    Component Value Date/Time   WBC 9.4 03/13/2021 0030   RBC 4.70 03/13/2021 0030   HGB 14.7 03/13/2021 0030   HGB 16.0 02/27/2020 0852   HCT 42.5 03/13/2021 0030   HCT 46.1 02/27/2020 0852   PLT 219 03/13/2021 0030   PLT 205 02/27/2020 0852   MCV 90.4 03/13/2021 0030   MCV 90 02/27/2020 0852   MCH 31.3 03/13/2021 0030   MCHC 34.6 03/13/2021 0030   RDW 13.0 03/13/2021 0030   RDW 14.6 02/27/2020 0852   LYMPHSABS 1.8 03/04/2021 1025   LYMPHSABS 4.4 (H) 02/27/2020  [id number]   MONOABS 1.2 (H) 03/04/2021 1025   EOSABS 0.1 03/04/2021 1025   EOSABS 0.5 (H) 02/27/2020 0852   BASOSABS 0.0  03/04/2021 1025   BASOSABS 0.0 02/27/2020 0852    BMET    Component Value Date/Time   NA 136 03/12/2021 0055   NA 139 02/27/2020 0852   K 3.6 03/12/2021 0055   CL 105 03/12/2021 0055   CO2 25 03/12/2021 0055   GLUCOSE 119 (H) 03/12/2021 0055   BUN 27 (H) 03/12/2021 0055   BUN 14 02/27/2020 0852   CREATININE 0.95 03/12/2021 0055   CALCIUM 8.3 (L) 03/12/2021 0055   GFRNONAA >60 03/12/2021 0055   GFRAA 79 02/27/2020 0852    BNP    Component Value Date/Time   BNP 54.3 03/08/2021 2058    ProBNP    Component Value Date/Time   PROBNP 180 01/22/2020 0917    Imaging: DG Chest 2 View  Result Date: 04/23/2021 CLINICAL DATA:  COPD exacerbation, follow-up EXAM: CHEST - 2 VIEW COMPARISON:  03/08/2021 FINDINGS: Background changes of COPD. Chronic interstitial changes. No pleural effusion or pneumothorax. Stable cardiomediastinal contours. No acute osseous abnormality. IMPRESSION: No acute process.  Chronic interstitial changes. Electronically Signed   By: Macy Mis M.D.   On: 04/23/2021 09:10     Assessment & Plan:   COPD (chronic obstructive pulmonary disease) ([place]) - Stable interval; He has no acute complaints today. Admitted in June 2022 for COPD exacerbation, discharged on oxygen. PFTs today showed mild obstruction with moderate diffusion defect, these remain stable compared to 2019. He is compliant with Breztri Aerosphere and supplemental oxygen. Previous CXR showed chronic bronchitis, we will repeat imaging today to follow-up.   Chronic respiratory failure with hypoxia (HCC) - Ambulatory O2 today remained >94% RA, continue 2L prn with exertion and at night - We will check over night oximetry test on room air   Smoker within last 12 months - Patient quit smoking in June 2022. He is currently using nicotine patches as NRT - Advised patient to follow nicotine patch instruction below (call 1-800-quit-now for free nicotine replacement therapy)  Smoking cessation: 10  cigarettes/day: Begin with step 1 (21 mg/day) for 6 weeks, followed by step 2 (14 mg/day) one box for 2 weeks; finish with step 3 (7 mg/day) one box for 2 weeks.   3 months follow-up with Dr. Illene Labrador, NP 04/23/2021

## 2021-04-22 NOTE — Progress Notes (Signed)
PFT done today. 

## 2021-04-22 NOTE — Patient Instructions (Addendum)
Breathing test today showed stable lung function  Recommendations: Continue to use Breztri two puffs morning and evening (rinse mouth after use) Use albuterol rescue inhaler every 6 ours as needed for breakthrough shortness of breath/wheezing Continue to wear 1L oxygen with exertion and at night  Follow nicotine patch instruction below (call 1-800-quit-now for free nicotine replacement therapy)  Smoking cessation: 10 cigarettes/day: Begin with step 1 (21 mg/day) for 6 weeks, followed by step 2 (14 mg/day) one box for 2 weeks; finish with step 3 (7 mg/day) one box for 2 weeks.   Orders: Checking CXR today Simple walk test today   Follow-up: 3 months follow-up with Dr. Chase Caller

## 2021-04-23 ENCOUNTER — Encounter: Payer: Self-pay | Admitting: Primary Care

## 2021-04-23 DIAGNOSIS — J9611 Chronic respiratory failure with hypoxia: Secondary | ICD-10-CM | POA: Insufficient documentation

## 2021-04-23 NOTE — Assessment & Plan Note (Signed)
-   Ambulatory O2 today remained >94% RA, continue 2L prn with exertion and at night - We will check over night oximetry test on room air

## 2021-04-23 NOTE — Assessment & Plan Note (Addendum)
-   Stable interval; He has no acute complaints today. Admitted in June 2022 for COPD exacerbation, discharged on oxygen. PFTs today showed mild obstruction with moderate diffusion defect, these remain stable compared to 2019. He is compliant with Breztri Aerosphere and supplemental oxygen. Previous CXR showed chronic bronchitis, we will repeat imaging today to follow-up.

## 2021-04-23 NOTE — Progress Notes (Signed)
CXR showed no acute process, chronic interstitial changes felt to be d/t chronic bronchitis. He had some scarring and emphysema back in 2020 on LDCT. I will re-refer him to our lung cancer screening program. Recommend he take mucinex twice a day and use a flutter valve if he has one.   Cc: Denies [employer]

## 2021-04-23 NOTE — Assessment & Plan Note (Signed)
-   Patient quit smoking in June 2022. He is currently using nicotine patches as NRT - Advised patient to follow nicotine patch instruction below (call 1-800-quit-now for free nicotine replacement therapy)  Smoking cessation: 10 cigarettes/day: Begin with step 1 (21 mg/day) for 6 weeks, followed by step 2 (14 mg/day) one box for 2 weeks; finish with step 3 (7 mg/day) one box for 2 weeks.

## 2021-04-24 DIAGNOSIS — I428 Other cardiomyopathies: Secondary | ICD-10-CM | POA: Diagnosis not present

## 2021-04-24 DIAGNOSIS — J449 Chronic obstructive pulmonary disease, unspecified: Secondary | ICD-10-CM | POA: Diagnosis not present

## 2021-04-28 DIAGNOSIS — G4730 Sleep apnea, unspecified: Secondary | ICD-10-CM | POA: Diagnosis not present

## 2021-04-28 DIAGNOSIS — R0683 Snoring: Secondary | ICD-10-CM | POA: Diagnosis not present

## 2021-05-01 ENCOUNTER — Telehealth: Payer: Self-pay | Admitting: Internal Medicine

## 2021-05-01 NOTE — Telephone Encounter (Signed)
ONO 05/01/21 showed patient spent 46 min with SpO2 <88%. SpO2 low 82%, basal 91%. He needs to continue to wear 2L oxygen at night. I spoke with him and notified him of results. Not currently able to discontinue oxygen.

## 2021-05-13 DIAGNOSIS — J449 Chronic obstructive pulmonary disease, unspecified: Secondary | ICD-10-CM | POA: Diagnosis not present

## 2021-05-13 DIAGNOSIS — R531 Weakness: Secondary | ICD-10-CM | POA: Diagnosis not present

## 2021-05-14 DIAGNOSIS — J449 Chronic obstructive pulmonary disease, unspecified: Secondary | ICD-10-CM | POA: Diagnosis not present

## 2021-05-14 DIAGNOSIS — R531 Weakness: Secondary | ICD-10-CM | POA: Diagnosis not present

## 2021-06-04 DIAGNOSIS — M67912 Unspecified disorder of synovium and tendon, left shoulder: Secondary | ICD-10-CM | POA: Diagnosis not present

## 2021-06-09 DIAGNOSIS — Z23 Encounter for immunization: Secondary | ICD-10-CM | POA: Diagnosis not present

## 2021-06-13 DIAGNOSIS — J449 Chronic obstructive pulmonary disease, unspecified: Secondary | ICD-10-CM | POA: Diagnosis not present

## 2021-06-13 DIAGNOSIS — R531 Weakness: Secondary | ICD-10-CM | POA: Diagnosis not present

## 2021-06-14 DIAGNOSIS — R531 Weakness: Secondary | ICD-10-CM | POA: Diagnosis not present

## 2021-06-14 DIAGNOSIS — J449 Chronic obstructive pulmonary disease, unspecified: Secondary | ICD-10-CM | POA: Diagnosis not present

## 2021-07-13 DIAGNOSIS — J449 Chronic obstructive pulmonary disease, unspecified: Secondary | ICD-10-CM | POA: Diagnosis not present

## 2021-07-13 DIAGNOSIS — R531 Weakness: Secondary | ICD-10-CM | POA: Diagnosis not present

## 2021-07-14 DIAGNOSIS — J449 Chronic obstructive pulmonary disease, unspecified: Secondary | ICD-10-CM | POA: Diagnosis not present

## 2021-07-14 DIAGNOSIS — R531 Weakness: Secondary | ICD-10-CM | POA: Diagnosis not present

## 2021-07-21 ENCOUNTER — Other Ambulatory Visit: Payer: Self-pay

## 2021-07-21 ENCOUNTER — Ambulatory Visit (INDEPENDENT_AMBULATORY_CARE_PROVIDER_SITE_OTHER): Payer: Medicare Other | Admitting: Internal Medicine

## 2021-07-21 ENCOUNTER — Encounter: Payer: Self-pay | Admitting: Internal Medicine

## 2021-07-21 VITALS — BP 130/74 | HR 66 | Temp 97.6°F | Ht 69.0 in | Wt 201.2 lb

## 2021-07-21 DIAGNOSIS — R053 Chronic cough: Secondary | ICD-10-CM | POA: Diagnosis not present

## 2021-07-21 DIAGNOSIS — R0609 Other forms of dyspnea: Secondary | ICD-10-CM

## 2021-07-21 DIAGNOSIS — J449 Chronic obstructive pulmonary disease, unspecified: Secondary | ICD-10-CM | POA: Diagnosis not present

## 2021-07-21 DIAGNOSIS — Z87891 Personal history of nicotine dependence: Secondary | ICD-10-CM | POA: Diagnosis not present

## 2021-07-21 MED ORDER — ALBUTEROL SULFATE HFA 108 (90 BASE) MCG/ACT IN AERS: 1.0000 | INHALATION_SPRAY | Freq: Four times a day (QID) | RESPIRATORY_TRACT | 3 refills | Status: AC | PRN

## 2021-07-21 MED ORDER — BENZONATATE 200 MG PO CAPS: 200.0000 mg | ORAL_CAPSULE | Freq: Three times a day (TID) | ORAL | 2 refills | Status: DC | PRN

## 2021-07-21 NOTE — Patient Instructions (Addendum)
ICD-10-CM   1. Stopped smoking with greater than 40 pack year history  Z87.891     2. Stage 1 mild COPD by GOLD classification ([place])  J44.9     3. Chronic cough  R05.3     4. DOE (dyspnea on exertion)  R06.09        Stable since recent admit June 2022  but some residual dyspnea on exertion Noted that your wife is concerned about night o2 machine makig too much noise - given your improivement might not needed Mild cough could be from fish oil or entresto or both - but seems tessalon helping you  Plan  - cotninue breztril scheduled and albuterol as needed  - refill tessalon - re-test ONO on room air - if normal can return o2  - do HRCT supine and prone in 3 months - consider stopping fish oilk  Followup  - will call with ONO results  - return in 3 months after HRCT

## 2021-07-21 NOTE — Progress Notes (Signed)
@Patient  ID: Mark Tate, male    DOB: [birth year], 70 y.o.   MRN: [id number]  Chief Complaint  Patient presents with   Follow-up    PFT review.    HPI  IOV 02/24/2018  Chief Complaint  Patient presents with   Consult    Referred by [name] due to breathing issues. Pt states he mainly becomes SOB with exertion. Denies any complaints of cough or CP.   70 year old male smoker 45 pack.  Referred by [name] who is a partner Dr. Marlou [facility] [place] practice.  Patient's brother is a Engineer, drilling in [place], [place].  He tells me that his mom died recently and the brother spent some time with him and noticed he was short of breath with exertion.  Also had chronic cough and fatigue.  Symptoms have been going on for a few years unchanged stable insidious onset no associated chest pain or radiation.  There is significant associated wheezing.  Therefore he went and saw her primary care physician's office [name].  Was started on Symbicort may be over a week ago along with prednisone and antibiotics.  He says is given some relief.  He has been taking Symbicort twice daily.  COPD CAT score is 9 based on symptomatology but we do not know if he has COPD.  Symptom severity is rated below.  There is no chest x-ray report review.  No outside records for review.  No lab test for review.  OV 04/14/2018  Chief Complaint  Patient presents with   Follow-up    PFT today    Follow-up shortness of breath and coughin the setting of smoking. Here to review test results. . In the interim no new complaints. He says Symbicort works well for him. He does not want to change his inhaler to anything else. He feels symptoms are actually better compared to 02/24/2018 last saw him. He had pulmonary function test today that I personally reviewed and visualized. He is isolated reduction in diffusion capacity in the setting of Symbicort. He had a low-dose CT scan of the chest For surveillance annually has a 2  mm right upper lobe nodule and emphysema. There are no other new findings.   CAT COPD Symptom & Quality of Life Score (GSK trademark) 0 is no burden. 5 is highest burden 02/24/2018   Never Cough -> Cough all the time 2  No phlegm in chest -> Chest is full of phlegm 0  No chest tightness -> Chest feels very tight 0  No dyspnea for 1 flight stairs/hill -> Very dyspneic for 1 flight of stairs 4  No limitations for ADL at home -> Very limited with ADL at home 0  Confident leaving home -> Not at all confident leaving home 0  Sleep soundly -> Do not sleep soundly because of lung condition 0  Lots of Energy -> No energy at all 3  TOTAL Score (max 40)  9      03/26/21- Gro[place], NP/ Hospital follow-up 70 year old male, former smoker quit in June 2022 (90-pack-year history).  Past medical history significant for COPD.  Patient of Dr. Chase Caller last seen in office on 03/26/2021 by pulmonary nurse practitioner for COPD exacerbation. He was seen as an inpatient for an exacerbation 03/04/21-03/13/2021.   Hospital follow up for COPD flare. Pt  endorsed ongoing tobacco abuse at the time of admission who had 1 week of upper respiratory symptoms and worsening shortness of breath prior to admission on 6/7 He  was treated with oxygen, azithromycin, steroids, and bronchodilators. He did require Bipap. Home maintenance is Breztri. He did not have a rescue inhaler, but has subsequently been prescribed one. Prior to admission he was smoking 2 PPD.  He has not smoked since admission. He is currently using 21 mg nicotine patches.    Pt. Presents for hospital follow up. He was admitted 03/04/2021-03/13/2021 with exacerbation. He was treated with azithromycin, steroids, and bronchodilators. He did require Bipap.He has quit smoking . Last cigarette was 03/04/2021. He is using the nicotine patches. He is wearing oxygen at 2 L with portable tank and 3 L with home tubing. Oxygen saturations were 97% on 2 L after ambulating into the room.  We have discussed weaning oxygen just while he is at rest , and only if he has an oxygen saturation monitor to ensure his saturations are > 88% at all times. He has used his rescue inhaler x 2 since discharge. He states he does have chest congestion. He is using his IS and flutter valve. He is not using mucinex to thin secretions. He is willing to try this. He is working with Physical therapy. We discussed pulmonary rehab once he completes his home PT. He is using a walker. He is compliant with his Breztri 2 puffs in the morning and 2 puffs in the evening.He feels the nebulizer does help, and we have dicussed using this in the morning and the evening until he feels better. He is compliant with Flonase and claritin for rhinitis control optimization  There is a significant need for education. Both patient and his wife will benefit from this. I am very encouraged that he has quit smoking. Nicotime patches are working. We discussed weaning from the 21 mg to the 14 mg to the 7 mg patches. He will need reinforcement of this, and support.  He denies any fever, no purulent secretions. States he feels much better, but is not back to baseline. He states he does have wheezing at intervals.Secretions are clear with slight yellow tinge.   04/22/2021- Interim hx  Patient presents today for 1 month follow-up with PFTs. Accompanied by his wife. He is doing well today. No acute complaints. He was admitted in June for COPD exacerbation. He has been on oxygen since discharge, he was walked in office today and was able to maintain O2 level >94% on room air. He is compliant with Breztri Aerosphere 2 puffs twice daily. Pulmonary function testing today showed mild obstructive airway disease with moderate diffusion defect. These remain stable since last PFTs in 2019. CXR in June showed interstitial thickening felt to be related to chronic bronchitis.      Test Results: CT chest, lung cancer screening 05/23/2019  reviewed-significant emphysema, upper lobe predominant.  03/08/2021 CXR-hyperinflated, increased bibasilar markings but no discrete opacities.  Labs reviewed.  Bicarb 25 WBC 11.9 H/H 14.8/42.8  Echocardiogram 03/09/21-LVEF 60 to 65%, indeterminate diastolic function.  RV not well visualized.  Dilated IVC with greater than 50% respiratory variability.  LA and RA not well seen.  PFT 04/14/2018-mild obstruction without bronchodilator reversibility, FEV1 87% of predicted.  Mildly reduced total lung capacity-TLC 73% of predicted. DLCO moderately reduced, 58% predicted.    PFT-04/22/21-FVC 3.54 (92%), FEV1 2.27 (81%), ratio 64, TLC 113%, DLCOcor 14.05 (60%)   OV 07/21/2021  Subjective:  Patient ID: Mark Tate, male , DOB: [birth year] , age 67 y.o. , MRN: [id number] , ADDRESS: [place] [id number] PCP Wenda Low, MD Patient Care Team:  Wenda Low, MD as PCP - General (Internal Medicine) [name], MD as PCP - Cardiology (Cardiology)  This Provider for this visit: Treatment Team:  Attending Provider: Brand Males, MD    07/21/2021 -   Chief Complaint  Patient presents with   Follow-up    Pt states he has been doing okay since last visit. State he does have some SOB with exertion.   Follow-up Gold stage I COPD Follow-up heavy prior history of smoking Follow-up mild chronic cough because of COPD, interested in fish oil Hospitalization summer 2022 for respiratory issues  HPI Mark Tate 70 y.o. -returns for follow-up.  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  He is doing well.  Symptom burden is minimal.  He is off oxygen after nurse practitioner walked him at last visit in July 2022.  I personally saw him on in 2019.  After that he was lost to follow-up because of COVID and then hospitalized and the nurse practitioner saw him.  He still uses nighttime oxygen.  He says it helps him.  His wife says that it is causing too much noise in the house and would like to return  it.  They both mutually agreed to have overnight oxygen study done and make an assessment.  He takes Best boy for cough which is mild.  He wants me to do a refill but review of the medication list shows that he is on Entresto and fish oil.  I suggested he stop at least the fish oil and reassess.  His most recent chest x-ray this year suggest chronic ILD changes but his last CT scan of the chest was 2 years ago.  He has stable dyspnea on exertion and significant visceral obesity.    CT Chest data  No results found.  CAT Score 07/21/2021 02/24/2018  Total CAT Score 11 9      PFT  PFT Results Latest Ref Rng & Units 04/22/2021 04/14/2018  FVC-Pre L 3.56 3.79  FVC-Predicted Pre % 92 95  FVC-Post L 3.54 3.88  FVC-Predicted Post % 92 98  Pre FEV1/FVC % % 65 66  Post FEV1/FCV % % 64 65  FEV1-Pre L 2.30 2.51  FEV1-Predicted Pre % 82 86  FEV1-Post L 2.27 2.52  DLCO uncorrected ml/min/mmHg 14.09 16.56  DLCO UNC% % 60 69  DLCO corrected ml/min/mmHg 14.05 -  DLCO COR %Predicted % 60 -  DLVA Predicted % 55 65  TLC L 7.26 4.69  TLC % Predicted % 113 73  RV % Predicted % 150 23       has a past medical history of Allergies, Asthma, CAD in native artery (01/03/2020), COPD (chronic obstructive pulmonary disease) (HCC), COPD (chronic obstructive pulmonary disease) (HCC) (01/03/2020), Coughing, Fatigue, Neck swelling, Skin cancer, SOB (shortness of breath) on exertion, Tobacco use (01/03/2020), and Wheezing.   reports that he quit smoking about 4 months ago. His smoking use included cigarettes. He has a 90.00 pack-year smoking history. He has never used smokeless tobacco.  Past Surgical History:  Procedure Laterality Date   BASAL CELL CARCINOMA EXCISION     NECK   CATARACT EXTRACTION     MOHS SURGERY  2013, 2019   ON SCALP AND LEFT ARM    RIGHT/LEFT HEART CATH AND CORONARY ANGIOGRAPHY N/A 03/08/2020   Procedure: RIGHT/LEFT HEART CATH AND CORONARY ANGIOGRAPHY;  Surgeon: Burnell Blanks, MD;  Location: [facility];  Service: Cardiovascular;  Laterality: N/A;    No Known Allergies  Immunization History  Administered Date(s) Administered   Fluad Quad(high Dose 65+) 07/07/2021   Influenza Split 07/27/2012, 07/09/2017, 07/26/2018   PFIZER(Purple Top)SARS-COV-2 Vaccination 11/30/2019, 12/26/2019, 11/14/2020   Pneumococcal Conjugate-13 04/12/2014   Pneumococcal Polysaccharide-23 04/22/2015   Td 11/26/2004   Tdap 05/12/2011   Zoster, Live 08/23/2012, 06/04/2019, 08/16/2019    Family History  Problem Relation Age of Onset   Cancer Mother        BONE, LUNG   Cancer Father        COLON, PROSTATE   Cancer - Lung Maternal Aunt      Current Outpatient Medications:    acetaminophen (TYLENOL) 500 MG tablet, Take 500 mg by mouth every 6 (six) hours as needed for headache. , Disp: , Rfl:    albuterol (PROVENTIL) (2.5 MG/3ML) 0.083% nebulizer solution, Take 3 mLs (2.5 mg total) by nebulization every 4 (four) hours as needed for wheezing or shortness of breath., Disp: 75 mL, Rfl: 1   Ascorbic Acid (VITAMIN C) 1000 MG tablet, Take 1,000 mg by mouth daily., Disp: , Rfl:    aspirin EC 81 MG tablet, Take 1 tablet (81 mg total) by mouth daily., Disp: 90 tablet, Rfl: 3   atorvastatin (LIPITOR) 40 MG tablet, TAKE 1 TABLET BY MOUTH EVERY DAY, Disp: 90 tablet, Rfl: 3   bisoprolol (ZEBETA) 10 MG tablet, Take 1 tablet (10 mg total) by mouth daily., Disp: 30 tablet, Rfl: 1   BREZTRI AEROSPHERE 160-9-4.8 MCG/ACT AERO, Take 2 puffs by mouth in the morning and at bedtime., Disp: 10.7 g, Rfl: 1   carvedilol (COREG) 6.25 MG tablet, 1/2 tablet, Disp: , Rfl:    cholecalciferol (VITAMIN D3) 25 MCG (1000 UNIT) tablet, Take 1,000 Units by mouth daily., Disp: , Rfl:    ENTRESTO 97-103 MG, TAKE 1 TABLET BY MOUTH 2 TIMES DAILY., Disp: 60 tablet, Rfl: 8   loratadine (CLARITIN) 10 MG tablet, Take 1 tablet (10 mg total) by mouth daily., Disp: 30 tablet, Rfl: 1   Multiple  Vitamins-Minerals (CENTRUM SILVER 50+MEN) TABS, Take 1 tablet by mouth daily., Disp: , Rfl:    Omega-3 Fatty Acids (FISH OIL) 1000 MG CAPS, Take 1,000 mg by mouth daily. , Disp: , Rfl:    albuterol (VENTOLIN HFA) 108 (90 Base) MCG/ACT inhaler, Inhale 1-2 puffs into the lungs every 6 (six) hours as needed for wheezing or shortness of breath., Disp: 18 g, Rfl: 3   benzonatate (TESSALON) 200 MG capsule, Take 1 capsule (200 mg total) by mouth 3 (three) times daily as needed for cough., Disp: 30 capsule, Rfl: 2      Objective:   Vitals:   07/21/21 1147  BP: 130/74  Pulse: 66  Temp: 97.6 F (36.4 C)  TempSrc: Oral  SpO2: 97%  Weight: 201 lb 3.2 oz (91.3 kg)  Height: 5\' 9"  (1.753 m)    Estimated body mass index is 29.71 kg/m as calculated from the following:   Height as of this encounter: 5\' 9"  (1.753 m).   Weight as of this encounter: 201 lb 3.2 oz (91.3 kg).  @WEIGHTCHANGE @  [employer]   07/21/21 1147  Weight: 201 lb 3.2 oz (91.3 kg)     Physical Exam  General: No distress. Looks well Neuro: Alert and Oriented x 3. GCS 15. Speech normal Psych: Pleasant Resp:  Barrel Chest - no.  Wheeze - no, Crackles - no, No overt respiratory distress CVS: Normal heart sounds. Murmurs - no Ext: Stigmata of Connective Tissue Disease - no HEENT: Normal upper airway. PEERL +.  No post nasal drip        Assessment:       ICD-10-CM   1. Stopped smoking with greater than 40 pack year history  Z87.891     2. Stage 1 mild COPD by GOLD classification (HCC)  J44.9 Pulse oximetry, overnight    3. Chronic cough  R05.3 Pulse oximetry, overnight    CT Chest High Resolution    4. DOE (dyspnea on exertion)  R06.09 Pulse oximetry, overnight         Plan:     Patient Instructions     ICD-10-CM   1. Stopped smoking with greater than 40 pack year history  Z87.891     2. Stage 1 mild COPD by GOLD classification ([facility])  J44.9     3. Chronic cough  R05.3     4. DOE (dyspnea on exertion)   R06.09        Stable since recent admit June 2022  but some residual dyspnea on exertion Noted that your wife is concerned about night o2 machine makig too much noise - given your improivement might not needed Mild cough could be from fish oil or entresto or both - but seems tessalon helping you  Plan  - cotninue breztril scheduled and albuterol as needed  - refill tessalon - re-test ONO on room air - if normal can return o2  - do HRCT supine and prone in 3 months - consider stopping fish oilk  Followup  - will call with ONO results  - return in 3 months after HRCT    SIGNATURE    Dr. Brand Males, M.D., F.C.C.P,  Pulmonary and Critical Care Medicine Staff Physician, [facility] Director - Interstitial Lung Disease  Program  [facility] [place], [place], [address]  Pager: [phone], If no answer or between  15:00h - 7:00h: call [phone] Telephone: [phone]  12:44 PM 07/21/2021

## 2021-07-29 DIAGNOSIS — I700 Atherosclerosis of aorta: Secondary | ICD-10-CM | POA: Diagnosis not present

## 2021-07-29 DIAGNOSIS — I428 Other cardiomyopathies: Secondary | ICD-10-CM | POA: Diagnosis not present

## 2021-07-29 DIAGNOSIS — R059 Cough, unspecified: Secondary | ICD-10-CM | POA: Diagnosis not present

## 2021-07-29 DIAGNOSIS — R5383 Other fatigue: Secondary | ICD-10-CM | POA: Diagnosis not present

## 2021-07-29 DIAGNOSIS — J449 Chronic obstructive pulmonary disease, unspecified: Secondary | ICD-10-CM | POA: Diagnosis not present

## 2021-07-30 DIAGNOSIS — R0683 Snoring: Secondary | ICD-10-CM | POA: Diagnosis not present

## 2021-07-30 DIAGNOSIS — G4730 Sleep apnea, unspecified: Secondary | ICD-10-CM | POA: Diagnosis not present

## 2021-08-13 DIAGNOSIS — R531 Weakness: Secondary | ICD-10-CM | POA: Diagnosis not present

## 2021-08-13 DIAGNOSIS — J449 Chronic obstructive pulmonary disease, unspecified: Secondary | ICD-10-CM | POA: Diagnosis not present

## 2021-08-14 DIAGNOSIS — R531 Weakness: Secondary | ICD-10-CM | POA: Diagnosis not present

## 2021-08-14 DIAGNOSIS — J449 Chronic obstructive pulmonary disease, unspecified: Secondary | ICD-10-CM | POA: Diagnosis not present

## 2021-08-29 DIAGNOSIS — D485 Neoplasm of uncertain behavior of skin: Secondary | ICD-10-CM | POA: Diagnosis not present

## 2021-08-29 DIAGNOSIS — C44212 Basal cell carcinoma of skin of right ear and external auricular canal: Secondary | ICD-10-CM | POA: Diagnosis not present

## 2021-08-29 DIAGNOSIS — L570 Actinic keratosis: Secondary | ICD-10-CM | POA: Diagnosis not present

## 2021-08-29 DIAGNOSIS — C44222 Squamous cell carcinoma of skin of right ear and external auricular canal: Secondary | ICD-10-CM | POA: Diagnosis not present

## 2021-08-29 DIAGNOSIS — C44629 Squamous cell carcinoma of skin of left upper limb, including shoulder: Secondary | ICD-10-CM | POA: Diagnosis not present

## 2021-09-01 NOTE — Telephone Encounter (Signed)
Patient came into the office and states he didn't hear back from his pulse ox on 05/01/2021 and about next steps. Please call patient again to discuss.

## 2021-09-01 NOTE — Telephone Encounter (Signed)
I have called the pt and he is aware of results per BW.

## 2021-09-12 DIAGNOSIS — J449 Chronic obstructive pulmonary disease, unspecified: Secondary | ICD-10-CM | POA: Diagnosis not present

## 2021-09-12 DIAGNOSIS — R531 Weakness: Secondary | ICD-10-CM | POA: Diagnosis not present

## 2021-09-13 DIAGNOSIS — J449 Chronic obstructive pulmonary disease, unspecified: Secondary | ICD-10-CM | POA: Diagnosis not present

## 2021-09-13 DIAGNOSIS — R531 Weakness: Secondary | ICD-10-CM | POA: Diagnosis not present

## 2021-10-02 DIAGNOSIS — J449 Chronic obstructive pulmonary disease, unspecified: Secondary | ICD-10-CM | POA: Diagnosis not present

## 2021-10-02 DIAGNOSIS — J45909 Unspecified asthma, uncomplicated: Secondary | ICD-10-CM | POA: Diagnosis not present

## 2021-10-02 DIAGNOSIS — I2510 Atherosclerotic heart disease of native coronary artery without angina pectoris: Secondary | ICD-10-CM | POA: Diagnosis not present

## 2021-10-02 DIAGNOSIS — E7800 Pure hypercholesterolemia, unspecified: Secondary | ICD-10-CM | POA: Diagnosis not present

## 2021-10-02 DIAGNOSIS — J441 Chronic obstructive pulmonary disease with (acute) exacerbation: Secondary | ICD-10-CM | POA: Diagnosis not present

## 2021-10-14 DIAGNOSIS — R531 Weakness: Secondary | ICD-10-CM | POA: Diagnosis not present

## 2021-10-14 DIAGNOSIS — J449 Chronic obstructive pulmonary disease, unspecified: Secondary | ICD-10-CM | POA: Diagnosis not present

## 2021-10-21 ENCOUNTER — Other Ambulatory Visit: Payer: Self-pay

## 2021-10-21 ENCOUNTER — Ambulatory Visit (INDEPENDENT_AMBULATORY_CARE_PROVIDER_SITE_OTHER)
Admission: RE | Admit: 2021-10-21 | Discharge: 2021-10-21 | Disposition: A | Discharge status: Home / Self Care | Payer: Medicare Other | Source: Ambulatory Visit | Attending: Internal Medicine | Admitting: Internal Medicine

## 2021-10-21 DIAGNOSIS — R918 Other nonspecific abnormal finding of lung field: Secondary | ICD-10-CM | POA: Diagnosis not present

## 2021-10-21 DIAGNOSIS — R053 Chronic cough: Secondary | ICD-10-CM

## 2021-10-22 ENCOUNTER — Telehealth: Payer: Self-pay | Admitting: Internal Medicine

## 2021-10-22 DIAGNOSIS — L905 Scar conditions and fibrosis of skin: Secondary | ICD-10-CM | POA: Diagnosis not present

## 2021-10-22 DIAGNOSIS — C44629 Squamous cell carcinoma of skin of left upper limb, including shoulder: Secondary | ICD-10-CM | POA: Diagnosis not present

## 2021-10-22 DIAGNOSIS — L570 Actinic keratosis: Secondary | ICD-10-CM | POA: Diagnosis not present

## 2021-10-22 DIAGNOSIS — C4492 Squamous cell carcinoma of skin, unspecified: Secondary | ICD-10-CM | POA: Diagnosis not present

## 2021-10-22 DIAGNOSIS — I719 Aortic aneurysm of unspecified site, without rupture: Secondary | ICD-10-CM

## 2021-10-22 NOTE — Telephone Encounter (Signed)
Call report from [facility]  IMPRESSION: 1. No evidence of fibrotic interstitial lung disease.  2. New tiny solid pulmonary nodules measuring less than 4 mm. Since patient is in lung cancer screening program, recommend attention on annual follow-up according to Lung [facility] guidelines.  3. Aneurysm of the descending measuring up to 5.4 cm, previously measured up to 4.5 cm. Greater than 5 mm growth over the past 12 months is associated with an increased risk of aneurysm rupture. Recommend cardiothoracic/vascular surgery referral if not already obtained. This recommendation follows 2010 ACCF/AHA/AATS/ACR/ASA/SCA/SCAI/SIR/STS/SVM Guidelines for the Diagnosis and Management of Patients With Thoracic Aortic Disease. Circulation. 2010; 121: H914-C458. Aortic aneurysm NOS (ICD10-I71.9)   These results will be called to the ordering clinician or representative by the Radiologist Assistant, and communication documented in the PACS or [employer].     Electronically Signed   By: Yetta Glassman M.D.   On: 10/22/2021 11:54

## 2021-10-22 NOTE — Telephone Encounter (Signed)
Called and spoke with pt letting him know the results of the recent CT and recommendations per SG. Pt verbalized understanding. Urgent referral to TCTS has been placed. Nothing further needed.

## 2021-10-24 ENCOUNTER — Other Ambulatory Visit: Payer: Self-pay | Admitting: *Deleted

## 2021-10-24 DIAGNOSIS — I719 Aortic aneurysm of unspecified site, without rupture: Secondary | ICD-10-CM

## 2021-10-28 ENCOUNTER — Ambulatory Visit: Payer: Medicare Other | Admitting: Vascular Surgery

## 2021-10-28 ENCOUNTER — Other Ambulatory Visit: Payer: Self-pay

## 2021-10-28 ENCOUNTER — Encounter: Payer: Self-pay | Admitting: Vascular Surgery

## 2021-10-28 DIAGNOSIS — I7123 Aneurysm of the descending thoracic aorta, without rupture: Secondary | ICD-10-CM | POA: Diagnosis not present

## 2021-10-28 DIAGNOSIS — I712 Thoracic aortic aneurysm, without rupture, unspecified: Secondary | ICD-10-CM | POA: Insufficient documentation

## 2021-10-28 NOTE — Progress Notes (Signed)
Patient name: Mark Tate MRN: [id number] DOB: 1951-08-14 Sex: male  REASON FOR CONSULT: Evaluate descending thoracic aneurysm  HPI: Mark Tate is a 71 y.o. male, with history of COPD that presents for evaluation of descending thoracic aneurysm.  Patient states he had no knowledge of this aneurysm until recent CT chest this year for cancer screening.  He denies any abdominal back or chest pain.  In review of the notes it was identified on CT chest 05/23/2019 at which time it measured 4.3 cm.  Most recent CT on 10/22/2021 shows it measured 5.4 cm in maximal diameter.  States he quit smoking around Christmas.    Past Medical History:  Diagnosis Date   Allergies    Asthma    CAD in native artery 01/03/2020   COPD (chronic obstructive pulmonary disease) (HCC)    COPD (chronic obstructive pulmonary disease) (Reform) 01/03/2020   Coughing    Fatigue    Neck swelling    Skin cancer    SOB (shortness of breath) on exertion    Tobacco use 01/03/2020   Wheezing     Past Surgical History:  Procedure Laterality Date   BASAL CELL CARCINOMA EXCISION     NECK   CATARACT EXTRACTION     MOHS SURGERY  2013, 2019   ON SCALP AND LEFT ARM    RIGHT/LEFT HEART CATH AND CORONARY ANGIOGRAPHY N/A 03/08/2020   Procedure: RIGHT/LEFT HEART CATH AND CORONARY ANGIOGRAPHY;  Surgeon: Burnell Blanks, MD;  Location: [facility];  Service: Cardiovascular;  Laterality: N/A;    Family History  Problem Relation Age of Onset   Cancer Mother        BONE, LUNG   Cancer Father        COLON, PROSTATE   Cancer - Lung Maternal Aunt     SOCIAL HISTORY: Social History   Socioeconomic History   Marital status: Married    Spouse name: Not on file   Number of children: Not on file   Years of education: Not on file   Highest education level: Not on file  Occupational History   Not on file  Tobacco Use   Smoking status: Former    Packs/day: 2.00    Years: 45.00    Pack years: 90.00    Types:  Cigarettes    Quit date: 02/2021    Years since quitting: 0.6   Smokeless tobacco: Never  Vaping Use   Vaping Use: Never used  Substance and Sexual Activity   Alcohol use: Not Currently   Drug use: Never   Sexual activity: Not on file  Other Topics Concern   Not on file  Social History Narrative   Not on file   Social Determinants of Health   Financial Resource Strain: Not on file  Food Insecurity: Not on file  Transportation Needs: Not on file  Physical Activity: Not on file  Stress: Not on file  Social Connections: Not on file  Intimate Partner Violence: Not on file    No Known Allergies  Current Outpatient Medications  Medication Sig Dispense Refill   acetaminophen (TYLENOL) 500 MG tablet Take 500 mg by mouth every 6 (six) hours as needed for headache.      albuterol (PROVENTIL) (2.5 MG/3ML) 0.083% nebulizer solution Take 3 mLs (2.5 mg total) by nebulization every 4 (four) hours as needed for wheezing or shortness of breath. 75 mL 1   albuterol (VENTOLIN HFA) 108 (90 Base) MCG/ACT inhaler Inhale 1-2  puffs into the lungs every 6 (six) hours as needed for wheezing or shortness of breath. 18 g 3   Ascorbic Acid (VITAMIN C) 1000 MG tablet Take 1,000 mg by mouth daily.     aspirin EC 81 MG tablet Take 1 tablet (81 mg total) by mouth daily. 90 tablet 3   atorvastatin (LIPITOR) 40 MG tablet TAKE 1 TABLET BY MOUTH EVERY DAY 90 tablet 3   benzonatate (TESSALON) 200 MG capsule Take 1 capsule (200 mg total) by mouth 3 (three) times daily as needed for cough. 30 capsule 2   bisoprolol (ZEBETA) 10 MG tablet Take 1 tablet (10 mg total) by mouth daily. 30 tablet 1   BREZTRI AEROSPHERE 160-9-4.8 MCG/ACT AERO Take 2 puffs by mouth in the morning and at bedtime. 10.7 g 1   carvedilol (COREG) 6.25 MG tablet 1/2 tablet     cholecalciferol (VITAMIN D3) 25 MCG (1000 UNIT) tablet Take 1,000 Units by mouth daily.     ENTRESTO 97-103 MG TAKE 1 TABLET BY MOUTH 2 TIMES DAILY. 60 tablet 8    loratadine (CLARITIN) 10 MG tablet Take 1 tablet (10 mg total) by mouth daily. 30 tablet 1   Multiple Vitamins-Minerals (CENTRUM SILVER 50+MEN) TABS Take 1 tablet by mouth daily.     Omega-3 Fatty Acids (FISH OIL) 1000 MG CAPS Take 1,000 mg by mouth daily.      No current facility-administered medications for this visit.    REVIEW OF SYSTEMS:  [X]  denotes positive finding, [ ]  denotes negative finding Cardiac  Comments:  Chest pain or chest pressure:    Shortness of breath upon exertion:    Short of breath when lying flat:    Irregular heart rhythm:        Vascular    Pain in calf, thigh, or hip brought on by ambulation:    Pain in feet at night that wakes you up from your sleep:     Blood clot in your veins:    Leg swelling:         Pulmonary    Oxygen at home:    Productive cough:     Wheezing:         Neurologic    Sudden weakness in arms or legs:     Sudden numbness in arms or legs:     Sudden onset of difficulty speaking or slurred speech:    Temporary loss of vision in one eye:     Problems with dizziness:         Gastrointestinal    Blood in stool:     Vomited blood:         Genitourinary    Burning when urinating:     Blood in urine:        Psychiatric    Major depression:         Hematologic    Bleeding problems:    Problems with blood clotting too easily:        Skin    Rashes or ulcers:        Constitutional    Fever or chills:      PHYSICAL EXAM: Vitals:   10/28/21 1233  BP: (!) 147/86  Pulse: 75  Resp: 16  Temp: 97.9 F (36.6 C)  TempSrc: Temporal  SpO2: 96%  Weight: 201 lb (91.2 kg)  Height: 5\' 10"  (1.778 m)    GENERAL: The patient is a well-nourished male, in no acute distress. The vital signs are documented above.  CARDIAC: There is a regular rate and rhythm.  VASCULAR:  Palpable femoral pulses bilaterally Palpable PT pulses bilaterally PULMONARY: No respiratory distress. ABDOMEN: Soft and non-tender. MUSCULOSKELETAL: There  are no major deformities or cyanosis. NEUROLOGIC: No focal weakness or paresthesias are detected. SKIN: There are no ulcers or rashes noted. PSYCHIATRIC: The patient has a normal affect.  DATA:   CT Chest reviewed from 10/22/21     Assessment/Plan:  71 year old male presents with a 5.4 cm saccular aneurysm in the descending thoracic aorta as pictured above.  This was initial identified on CT lung cancer screening in 2020 at which time it measured 4.3 cm in maximal diameter.  I discussed current guidelines are to repair these at 5.5 to 6 cm but given the saccular morphology these are considered to be higher risk.  I do think we should strongly consider stent graft repair via transfemoral access.  I have recommended a CTA chest abdomen pelvis to further evaluate his access.  I will have him follow-up with me after CTA complete and we can further evaluate options moving forward and discuss repair.   Marty Heck, MD [facility] Office: [phone]

## 2021-10-28 NOTE — Patient Instructions (Signed)
2867 °

## 2021-10-29 ENCOUNTER — Other Ambulatory Visit: Payer: Self-pay

## 2021-10-29 DIAGNOSIS — I7123 Aneurysm of the descending thoracic aorta, without rupture: Secondary | ICD-10-CM

## 2021-10-31 ENCOUNTER — Ambulatory Visit
Admission: RE | Admit: 2021-10-31 | Discharge: 2021-10-31 | Disposition: A | Discharge status: Home / Self Care | Payer: Medicare Other | Source: Ambulatory Visit | Attending: Vascular Surgery | Admitting: Vascular Surgery

## 2021-10-31 ENCOUNTER — Other Ambulatory Visit: Payer: Medicare Other

## 2021-10-31 DIAGNOSIS — I7123 Aneurysm of the descending thoracic aorta, without rupture: Secondary | ICD-10-CM

## 2021-10-31 DIAGNOSIS — Z01818 Encounter for other preprocedural examination: Secondary | ICD-10-CM | POA: Diagnosis not present

## 2021-10-31 DIAGNOSIS — I712 Thoracic aortic aneurysm, without rupture, unspecified: Secondary | ICD-10-CM | POA: Diagnosis not present

## 2021-10-31 DIAGNOSIS — K5730 Diverticulosis of large intestine without perforation or abscess without bleeding: Secondary | ICD-10-CM | POA: Diagnosis not present

## 2021-10-31 DIAGNOSIS — J432 Centrilobular emphysema: Secondary | ICD-10-CM | POA: Diagnosis not present

## 2021-10-31 DIAGNOSIS — K8020 Calculus of gallbladder without cholecystitis without obstruction: Secondary | ICD-10-CM | POA: Diagnosis not present

## 2021-10-31 MED ORDER — IOPAMIDOL (ISOVUE-370) INJECTION 76%
75.0000 mL | Freq: Once | INTRAVENOUS | Status: AC | PRN
  Administered 2021-10-31: 75 mL via INTRAVENOUS

## 2021-11-04 DIAGNOSIS — Z481 Encounter for planned postprocedural wound closure: Secondary | ICD-10-CM | POA: Diagnosis not present

## 2021-11-04 DIAGNOSIS — C4442 Squamous cell carcinoma of skin of scalp and neck: Secondary | ICD-10-CM | POA: Diagnosis not present

## 2021-11-04 DIAGNOSIS — C4492 Squamous cell carcinoma of skin, unspecified: Secondary | ICD-10-CM | POA: Diagnosis not present

## 2021-11-14 DIAGNOSIS — J449 Chronic obstructive pulmonary disease, unspecified: Secondary | ICD-10-CM | POA: Diagnosis not present

## 2021-11-14 DIAGNOSIS — R531 Weakness: Secondary | ICD-10-CM | POA: Diagnosis not present

## 2021-11-18 ENCOUNTER — Encounter: Payer: Self-pay | Admitting: Vascular Surgery

## 2021-11-18 ENCOUNTER — Other Ambulatory Visit: Payer: Self-pay

## 2021-11-18 ENCOUNTER — Ambulatory Visit: Payer: Medicare Other | Admitting: Vascular Surgery

## 2021-11-18 VITALS — BP 134/87 | HR 81 | Temp 97.7°F | Resp 16 | Ht 70.0 in | Wt 200.0 lb

## 2021-11-18 DIAGNOSIS — I7123 Aneurysm of the descending thoracic aorta, without rupture: Secondary | ICD-10-CM | POA: Diagnosis not present

## 2021-11-18 DIAGNOSIS — Z48817 Encounter for surgical aftercare following surgery on the skin and subcutaneous tissue: Secondary | ICD-10-CM | POA: Diagnosis not present

## 2021-11-18 DIAGNOSIS — Z4801 Encounter for change or removal of surgical wound dressing: Secondary | ICD-10-CM | POA: Diagnosis not present

## 2021-11-18 NOTE — Progress Notes (Signed)
Patient name: Mark Tate MRN: [id number] DOB: 1951-08-01 Sex: male  REASON FOR CONSULT: F/U after CTA C/A/P to further evaluate descending thoracic aneurysm  HPI: Mark Tate is a 71 y.o. male, with history of COPD that presents for follow-up after CTA to further evaluate descending thoracic aneurysm.  Patient was referred for thoracic aneurysm earlier this year and stated he had no knowledge of this aneurysm until recent CT chest this year for cancer screening.  In review of the notes it was identified on CT chest 05/23/2019 at which time it measured 4.3 cm.  Most recent CT on 10/22/2021 shows it measured 5.4 cm in maximal diameter.    CTA chest was ordered to evaluate femoral/iliac access and options for stent graft repair.  He reports no changes today.  Still has quit smoking since Christmas.  No chest or back pain.  Past Medical History:  Diagnosis Date   Allergies    Asthma    CAD in native artery 01/03/2020   COPD (chronic obstructive pulmonary disease) (HCC)    COPD (chronic obstructive pulmonary disease) ([place]) 01/03/2020   Coughing    Fatigue    Neck swelling    Skin cancer    SOB (shortness of breath) on exertion    Tobacco use 01/03/2020   Wheezing     Past Surgical History:  Procedure Laterality Date   BASAL CELL CARCINOMA EXCISION     NECK   CATARACT EXTRACTION     MOHS SURGERY  2013, 2019   ON SCALP AND LEFT ARM    RIGHT/LEFT HEART CATH AND CORONARY ANGIOGRAPHY N/A 03/08/2020   Procedure: RIGHT/LEFT HEART CATH AND CORONARY ANGIOGRAPHY;  Surgeon: Burnell Blanks, MD;  Location: [facility];  Service: Cardiovascular;  Laterality: N/A;    Family History  Problem Relation Age of Onset   Cancer Mother        BONE, LUNG   Cancer Father        COLON, PROSTATE   Cancer - Lung Maternal Aunt     SOCIAL HISTORY: Social History   Socioeconomic History   Marital status: Married    Spouse name: Not on file   Number of children: Not on file   Years of  education: Not on file   Highest education level: Not on file  Occupational History   Not on file  Tobacco Use   Smoking status: Former    Packs/day: 2.00    Years: 45.00    Pack years: 90.00    Types: Cigarettes    Quit date: 02/2021    Years since quitting: 0.7   Smokeless tobacco: Never  Vaping Use   Vaping Use: Never used  Substance and Sexual Activity   Alcohol use: Not Currently   Drug use: Never   Sexual activity: Not on file  Other Topics Concern   Not on file  Social History Narrative   Not on file   Social Determinants of Health   Financial Resource Strain: Not on file  Food Insecurity: Not on file  Transportation Needs: Not on file  Physical Activity: Not on file  Stress: Not on file  Social Connections: Not on file  Intimate Partner Violence: Not on file    No Known Allergies  Current Outpatient Medications  Medication Sig Dispense Refill   acetaminophen (TYLENOL) 500 MG tablet Take 500 mg by mouth every 6 (six) hours as needed for headache.      albuterol (PROVENTIL) (2.5 MG/3ML) 0.083%  nebulizer solution Take 3 mLs (2.5 mg total) by nebulization every 4 (four) hours as needed for wheezing or shortness of breath. 75 mL 1   albuterol (VENTOLIN HFA) 108 (90 Base) MCG/ACT inhaler Inhale 1-2 puffs into the lungs every 6 (six) hours as needed for wheezing or shortness of breath. 18 g 3   Ascorbic Acid (VITAMIN C) 1000 MG tablet Take 1,000 mg by mouth daily.     aspirin EC 81 MG tablet Take 1 tablet (81 mg total) by mouth daily. 90 tablet 3   benzonatate (TESSALON) 200 MG capsule Take 1 capsule (200 mg total) by mouth 3 (three) times daily as needed for cough. 30 capsule 2   bisoprolol (ZEBETA) 10 MG tablet Take 1 tablet (10 mg total) by mouth daily. 30 tablet 1   BREZTRI AEROSPHERE 160-9-4.8 MCG/ACT AERO Take 2 puffs by mouth in the morning and at bedtime. 10.7 g 1   carvedilol (COREG) 6.25 MG tablet 1/2 tablet     cholecalciferol (VITAMIN D3) 25 MCG (1000  UNIT) tablet Take 1,000 Units by mouth daily.     ENTRESTO 97-103 MG TAKE 1 TABLET BY MOUTH 2 TIMES DAILY. 60 tablet 8   loratadine (CLARITIN) 10 MG tablet Take 1 tablet (10 mg total) by mouth daily. 30 tablet 1   Multiple Vitamins-Minerals (CENTRUM SILVER 50+MEN) TABS Take 1 tablet by mouth daily.     Omega-3 Fatty Acids (FISH OIL) 1000 MG CAPS Take 1,000 mg by mouth daily.      atorvastatin (LIPITOR) 40 MG tablet TAKE 1 TABLET BY MOUTH EVERY DAY (Patient not taking: Reported on 11/18/2021) 90 tablet 3   No current facility-administered medications for this visit.    REVIEW OF SYSTEMS:  [X]  denotes positive finding, [ ]  denotes negative finding Cardiac  Comments:  Chest pain or chest pressure:    Shortness of breath upon exertion:    Short of breath when lying flat:    Irregular heart rhythm:        Vascular    Pain in calf, thigh, or hip brought on by ambulation:    Pain in feet at night that wakes you up from your sleep:     Blood clot in your veins:    Leg swelling:         Pulmonary    Oxygen at home:    Productive cough:     Wheezing:         Neurologic    Sudden weakness in arms or legs:     Sudden numbness in arms or legs:     Sudden onset of difficulty speaking or slurred speech:    Temporary loss of vision in one eye:     Problems with dizziness:         Gastrointestinal    Blood in stool:     Vomited blood:         Genitourinary    Burning when urinating:     Blood in urine:        Psychiatric    Major depression:         Hematologic    Bleeding problems:    Problems with blood clotting too easily:        Skin    Rashes or ulcers:        Constitutional    Fever or chills:      PHYSICAL EXAM: Vitals:   11/18/21 1434  BP: 134/87  Pulse: 81  Resp: 16  Temp:  97.7 F (36.5 C)  TempSrc: Temporal  SpO2: 92%  Weight: 200 lb (90.7 kg)  Height: 5\' 10"  (1.778 m)    GENERAL: The patient is a well-nourished male, in no acute distress. The vital  signs are documented above. CARDIAC: There is a regular rate and rhythm.  VASCULAR:  Palpable femoral pulses bilaterally Palpable PT pulses bilaterally PULMONARY: No respiratory distress. ABDOMEN: Soft and non-tender. MUSCULOSKELETAL: There are no major deformities or cyanosis. NEUROLOGIC: No focal weakness or paresthesias are detected. SKIN: There are no ulcers or rashes noted. PSYCHIATRIC: The patient has a normal affect.  DATA:   CTA C/A/P reviewed from 10/31/21 and descending thoracic saccular aneurysm measuring 5.7 cm  Assessment/Plan:  71 year old male presents with a 5.7 cm aneurysm in the descending thoracic aorta.  This has a saccular morphology in the mid descending thoracic aorta and tend to be higher risk morphology.  I previously discussed recommendations are to repair these at 5.5 to 6 cm based on surgical risk of the patient.  I think he would be a good candidate for stent graft repair and has adequate access.  I have recommended stent graft repair given that he has adequate access.  I discussed transfemoral access with stenting with a covered stent of his descending thoracic aorta.  We will get him scheduled today.  Risk benefits discussed including risk of anesthesia, MI, stroke, renal failure, access issues etc.   Marty Heck, MD [facility] Office: [phone]

## 2021-11-19 ENCOUNTER — Other Ambulatory Visit: Payer: Self-pay

## 2021-11-19 DIAGNOSIS — I7123 Aneurysm of the descending thoracic aorta, without rupture: Secondary | ICD-10-CM

## 2021-11-20 NOTE — Progress Notes (Signed)
Cardiology Office Note   Date:  11/25/2021   ID:  Mark Tate, Mark Tate 1951/06/28, MRN [id number]  PCP:  Wenda Low, MD  Cardiologist:  Dr. Johnsie Cancel     No chief complaint on file.     History of Present Illness: Mark Tate is a 71 y.o. male who presents for f/u of DCM  Hx of CAD on CT of chest. Smoker with 45 pack year history Seen by pulmonary Ramaswamy Rx with spiriva and symbicort Lung cancer screening CT done 05/23/19 no cancer Commented on LM coronary calcification and aortic atherosclerosis with focal dilatation of the descending thoracic aorta 4.3 cm moderate bullous emphysema This was also noted on my review of CT done 03/09/18 The calcification is very mild with no other calcium noted in the coronary arteries to my review   He has no exertional chest pain  Some exertional dyspnea from COPD Has had COVID vaccine Has two daughters from two marriages and 10 grand children. 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    12/21/19 Myoview suggest old infarct  01/01/20 Echo with decreased, EF to 30-35%, G1 DD      Started on coreg and entresto   03/08/20 Cath with pRCA 30 %, mid RCA to dist RCA 20% stenosed, porx LAD to mLAD 20% stenosis, normal Rt and Left heart pressure. Done 03/08/20  Repeat echo done 04/22/20 EF normalized 60-65% on meds   He has significant emphysema and still smokes Lung cancer CT picked up a descending thoracic aneurysm 5.5 cm Dr Carlis Abbott [facility] plans on stent graft repair  Has not smoked since hospital d/c    Past Medical History:  Diagnosis Date   Allergies    Asthma    CAD in native artery 01/03/2020   COPD (chronic obstructive pulmonary disease) (HCC)    COPD (chronic obstructive pulmonary disease) ([place]) 01/03/2020   Coughing    Fatigue    Neck swelling    Skin cancer    SOB (shortness of breath) on exertion    Tobacco use 01/03/2020   Wheezing     Past Surgical History:  Procedure Laterality Date   BASAL CELL CARCINOMA EXCISION     NECK    CATARACT EXTRACTION     MOHS SURGERY  2013, 2019   ON SCALP AND LEFT ARM    RIGHT/LEFT HEART CATH AND CORONARY ANGIOGRAPHY N/A 03/08/2020   Procedure: RIGHT/LEFT HEART CATH AND CORONARY ANGIOGRAPHY;  Surgeon: Burnell Blanks, MD;  Location: [facility];  Service: Cardiovascular;  Laterality: N/A;     Current Outpatient Medications  Medication Sig Dispense Refill   acetaminophen (TYLENOL) 500 MG tablet Take 500 mg by mouth every 6 (six) hours as needed for headache.      albuterol (PROVENTIL) (2.5 MG/3ML) 0.083% nebulizer solution Take 3 mLs (2.5 mg total) by nebulization every 4 (four) hours as needed for wheezing or shortness of breath. 75 mL 1   albuterol (VENTOLIN HFA) 108 (90 Base) MCG/ACT inhaler Inhale 1-2 puffs into the lungs every 6 (six) hours as needed for wheezing or shortness of breath. 18 g 3   Ascorbic Acid (VITAMIN C) 1000 MG tablet Take 1,000 mg by mouth daily.     aspirin EC 81 MG tablet Take 1 tablet (81 mg total) by mouth daily. 90 tablet 3   atorvastatin (LIPITOR) 40 MG tablet TAKE 1 TABLET BY MOUTH EVERY DAY 90 tablet 3   benzonatate (TESSALON) 200 MG capsule Take 1 capsule (200  mg total) by mouth 3 (three) times daily as needed for cough. 30 capsule 2   bisoprolol (ZEBETA) 10 MG tablet Take 1 tablet (10 mg total) by mouth daily. 30 tablet 1   BREZTRI AEROSPHERE 160-9-4.8 MCG/ACT AERO Take 2 puffs by mouth in the morning and at bedtime. 10.7 g 1   carvedilol (COREG) 6.25 MG tablet 1/2 tablet     cholecalciferol (VITAMIN D3) 25 MCG (1000 UNIT) tablet Take 1,000 Units by mouth daily.     ENTRESTO 97-103 MG TAKE 1 TABLET BY MOUTH 2 TIMES DAILY. 60 tablet 8   loratadine (CLARITIN) 10 MG tablet Take 1 tablet (10 mg total) by mouth daily. 30 tablet 1   Multiple Vitamins-Minerals (CENTRUM SILVER 50+MEN) TABS Take 1 tablet by mouth daily.     Omega-3 Fatty Acids (FISH OIL) 1000 MG CAPS Take 1,000 mg by mouth daily.  (Patient not taking: Reported on 11/25/2021)      No current facility-administered medications for this visit.    Allergies:   Patient has no known allergies.    Social History:  The patient  reports that he quit smoking about 8 months ago. His smoking use included cigarettes. He has a 90.00 pack-year smoking history. He has never used smokeless tobacco. He reports that he does not currently use alcohol. He reports that he does not use drugs.   Family History:  The patient's family history includes Cancer in his father and mother; Cancer - Lung in his maternal aunt.    ROS:  General:no colds or fevers, no weight changes Skin:no rashes or ulcers HEENT:no blurred vision, no congestion CV:see HPI PUL:see HPI GI:no diarrhea constipation or melena, no indigestion GU:no hematuria, no dysuria MS:no joint pain, no claudication Neuro:no syncope, no lightheadedness Endo:no diabetes, no thyroid disease  Wt Readings from Last 3 Encounters:  11/25/21 202 lb (91.6 kg)  11/18/21 200 lb (90.7 kg)  10/28/21 201 lb (91.2 kg)     PHYSICAL EXAM: BP 122/88    Pulse 70    Ht 5\' 11"  (1.803 m)    Wt 202 lb (91.6 kg)    SpO2 94%    BMI 28.17 kg/m  Affect appropriate Healthy:  appears stated age 89: normal Neck supple with no adenopathy JVP normal no bruits no thyromegaly Lungs clear with no wheezing and good diaphragmatic motion Heart:  S1/S2 no murmur, no rub, gallop or click PMI normal Abdomen: benighn, BS positve, no tenderness, no AAA no bruit.  No HSM or HJR Distal pulses intact with no bruits No edema Neuro non-focal Skin warm and dry No muscular weakness  EKG:   02/12/20 SR rate 71 normal     Recent Labs: 03/08/2021: B Natriuretic Peptide 54.3 03/12/2021: ALT 61; BUN 27; Creatinine, Ser 0.95; Potassium 3.6; Sodium 136 03/13/2021: Hemoglobin 14.7; Platelets 219    Lipid Panel    Component Value Date/Time   CHOL 128 02/12/2020 1053   TRIG 246 (H) 02/12/2020 1053   HDL 37 (L) 02/12/2020 1053   CHOLHDL 3.5 02/12/2020 1053    LDLCALC 52 02/12/2020 1053       Other studies Reviewed: Additional studies/ records that were reviewed today include:. Cardiac cath 03/08/20 Prox RCA lesion is 30% stenosed. Mid RCA to Dist RCA lesion is 20% stenosed. Prox LAD to Mid LAD lesion is 20% stenosed.   1. Mild non-obstructive CAD 2. Normal right and left heart pressures.    Recommendations: Medical management of mild non-obstructive CAD.   Echo:  04/22/20 IMPRESSIONS  1. COmpared to echo from April 2021 LVEF is improved.   2. Left ventricular ejection fraction, by estimation, is 60 to 65%. The  left ventricle has normal function. The left ventricle has no regional  wall motion abnormalities. Left ventricular diastolic parameters are  indeterminate.   3. Right ventricular systolic function is normal. The right ventricular  size is normal.   4. The mitral valve is normal in structure. Trivial mitral valve  regurgitation.   5. The aortic valve is normal in structure. Aortic valve regurgitation is  not visualized.   ASSESSMENT AND PLAN:  1.  NICM with EF 30-35%, on coreg 6.25 BID and entresto 97-103  TTE done 03/09/21 EF 60-65% continue medical Rx no indication for AICD Improved   2.  Mild nonobstructive CAD.  On cath 03/08/20 continue statin and BB  3.  HLD continue statin  4.  Tobacco use has decreased but not yet stopped  5.  COPD/dyspnea stable and followed by pulmonary. Lung cancer screening CT last 10/22/21 new nodules < 4 mm f/u Ramaswamy advanced emphysema with bronchial impaction   6. Descending Thoracic Aneurysm:  noted on CT above 5.5 cm has been seen by Dr Carlis Abbott [facility] Plan for stent graft repair    Current medicines are reviewed with the patient today.  The patient Has no concerns regarding medicines.  The following changes have been made:  See above Labs/ tests ordered today include:see above  Disposition:   FU: 6 months   Signed, Jenkins Rouge, MD  11/25/2021 10:06 AM    [facility] [place], [place], [place], [place] Phone: [phone]; Fax: [phone]

## 2021-11-25 ENCOUNTER — Ambulatory Visit: Payer: Medicare Other | Admitting: Cardiovascular Disease

## 2021-11-25 ENCOUNTER — Encounter: Payer: Self-pay | Admitting: Cardiovascular Disease

## 2021-11-25 ENCOUNTER — Other Ambulatory Visit: Payer: Self-pay

## 2021-11-25 VITALS — BP 122/88 | HR 70 | Ht 71.0 in | Wt 202.0 lb

## 2021-11-25 DIAGNOSIS — I428 Other cardiomyopathies: Secondary | ICD-10-CM

## 2021-11-25 DIAGNOSIS — J449 Chronic obstructive pulmonary disease, unspecified: Secondary | ICD-10-CM | POA: Diagnosis not present

## 2021-11-25 DIAGNOSIS — I420 Dilated cardiomyopathy: Secondary | ICD-10-CM | POA: Diagnosis not present

## 2021-11-25 DIAGNOSIS — E782 Mixed hyperlipidemia: Secondary | ICD-10-CM

## 2021-11-25 DIAGNOSIS — I712 Thoracic aortic aneurysm, without rupture, unspecified: Secondary | ICD-10-CM | POA: Diagnosis not present

## 2021-11-25 NOTE — Patient Instructions (Signed)

## 2021-11-27 DIAGNOSIS — I700 Atherosclerosis of aorta: Secondary | ICD-10-CM | POA: Diagnosis not present

## 2021-11-27 DIAGNOSIS — I428 Other cardiomyopathies: Secondary | ICD-10-CM | POA: Diagnosis not present

## 2021-11-27 DIAGNOSIS — E7800 Pure hypercholesterolemia, unspecified: Secondary | ICD-10-CM | POA: Diagnosis not present

## 2021-11-27 DIAGNOSIS — I712 Thoracic aortic aneurysm, without rupture, unspecified: Secondary | ICD-10-CM | POA: Diagnosis not present

## 2021-11-27 DIAGNOSIS — C4490 Unspecified malignant neoplasm of skin, unspecified: Secondary | ICD-10-CM | POA: Diagnosis not present

## 2021-11-27 DIAGNOSIS — J449 Chronic obstructive pulmonary disease, unspecified: Secondary | ICD-10-CM | POA: Diagnosis not present

## 2021-11-27 DIAGNOSIS — I2510 Atherosclerotic heart disease of native coronary artery without angina pectoris: Secondary | ICD-10-CM | POA: Diagnosis not present

## 2021-11-30 ENCOUNTER — Other Ambulatory Visit: Payer: Self-pay | Admitting: Cardiovascular Disease

## 2021-12-03 NOTE — Pre-Procedure Instructions (Signed)
Surgical Instructions ? ? ? Your procedure is scheduled on Monday 12/08/21. ? ? Report to [facility] Main Entrance "A" at 05:30 A.M., then check in with the Admitting office. ? Call this number if you have problems the morning of surgery: ? [phone] ? ? If you have any questions prior to your surgery date call [phone]: Open Monday-Friday 8am-4pm ? ? ? Remember: ? Do not eat or drink after midnight the night before your surgery ? ?  ? Take these medicines the morning of surgery with A SIP OF WATER:  ? albuterol (PROVENTIL) ? atorvastatin (LIPITOR) ? BREZTRI AEROSPHERE  ? carvedilol (COREG)  ? cetirizine (KLS ALLER-TEC)  ? ? Take these medicines if needed:  ? acetaminophen (TYLENOL)  ? albuterol (VENTOLIN HFA)  ? ?Please follow your surgeon's instructions regarding Aspirin.  ? ?As of today, STOP taking any Aleve, Naproxen, Ibuprofen, Motrin, Advil, Goody's, BC's, all herbal medications, fish oil, and all vitamins. ? ?         ?Do not wear jewelry or makeup ?Do not wear lotions, powders, perfumes/colognes, or deodorant. ?Do not shave 48 hours prior to surgery.  Men may shave face and neck. ?Do not bring valuables to the hospital. ?Do not wear nail polish, gel polish, artificial nails, or any other type of covering on natural nails (fingers and toes) ?If you have artificial nails or gel coating that need to be removed by a nail salon, please have this removed prior to surgery. Artificial nails or gel coating may interfere with anesthesia's ability to adequately monitor your vital signs. ? ?[facility] is not responsible for any belongings or valuables. .  ? ?Do NOT Smoke (Tobacco/Vaping)  24 hours prior to your procedure ? ?If you use a CPAP at night, you may bring your mask for your overnight stay. ?  ?Contacts, glasses, hearing aids, dentures or partials may not be worn into surgery, please bring cases for these belongings ?  ?For patients admitted to the hospital, discharge time will be determined by your  treatment team. ?  ?Patients discharged the day of surgery will not be allowed to drive home, and someone needs to stay with them for 24 hours. ? ?NO VISITORS WILL BE ALLOWED IN PRE-OP WHERE PATIENTS ARE PREPPED FOR SURGERY.  ONLY 1 SUPPORT PERSON MAY BE PRESENT IN THE WAITING ROOM WHILE YOU ARE IN SURGERY.  IF YOU ARE TO BE ADMITTED, ONCE YOU ARE IN YOUR ROOM YOU WILL BE ALLOWED TWO (2) VISITORS. 1 (ONE) VISITOR MAY STAY OVERNIGHT BUT MUST ARRIVE TO THE ROOM BY 8pm.  Minor children may have two parents present. Special consideration for safety and communication needs will be reviewed on a case by case basis. ? ?Special instructions:   ? ?Oral Hygiene is also important to reduce your risk of infection.  Remember - BRUSH YOUR TEETH THE MORNING OF SURGERY WITH YOUR REGULAR TOOTHPASTE ? ? ?[facility]- Preparing For Surgery ? ?Before surgery, you can play an important role. Because skin is not sterile, your skin needs to be as free of germs as possible. You can reduce the number of germs on your skin by washing with CHG (chlorahexidine gluconate) Soap before surgery.  CHG is an antiseptic cleaner which kills germs and bonds with the skin to continue killing germs even after washing.   ? ? ?Please do not use if you have an allergy to CHG or antibacterial soaps. If your skin becomes reddened/irritated stop using the CHG.  ?Do not shave (including  legs and underarms) for at least 48 hours prior to first CHG shower. It is OK to shave your face. ? ?Please follow these instructions carefully. ?  ? ? Shower the NIGHT BEFORE SURGERY and the MORNING OF SURGERY with CHG Soap.  ? If you chose to wash your hair, wash your hair first as usual with your normal shampoo. After you shampoo, rinse your hair and body thoroughly to remove the shampoo.  Then [employer] and genitals (private parts) with your normal soap and rinse thoroughly to remove soap. ? ?After that Use CHG Soap as you would any other liquid soap. You can apply CHG  directly to the skin and wash gently with a scrungie or a clean washcloth.  ? ?Apply the CHG Soap to your body ONLY FROM THE NECK DOWN.  Do not use on open wounds or open sores. Avoid contact with your eyes, ears, mouth and genitals (private parts). Wash Face and genitals (private parts)  with your normal soap.  ? ?Wash thoroughly, paying special attention to the area where your surgery will be performed. ? ?Thoroughly rinse your body with warm water from the neck down. ? ?DO NOT shower/wash with your normal soap after using and rinsing off the CHG Soap. ? ?Pat yourself dry with a CLEAN TOWEL. ? ?Wear CLEAN PAJAMAS to bed the night before surgery ? ?Place CLEAN SHEETS on your bed the night before your surgery ? ?DO NOT SLEEP WITH PETS. ? ? ?Day of Surgery: ? ?Take a shower with CHG soap. ?Wear Clean/Comfortable clothing the morning of surgery ?Do not apply any deodorants/lotions.   ?Remember to brush your teeth WITH YOUR REGULAR TOOTHPASTE. ? ? ? ?COVID testing ? ?If you are going to stay overnight or be admitted after your procedure/surgery and require a pre-op COVID test, please follow these instructions after your COVID test  ? ?You are not required to quarantine however you are required to wear a well-fitting mask when you are out and around people not in your household.  If your mask becomes wet or soiled, replace with a new one. ? ?Wash your hands often with soap and water for 20 seconds or clean your hands with an alcohol-based hand sanitizer that contains at least 60% alcohol. ? ?Do not share personal items. ? ?Notify your provider: ?if you are in close contact with someone who has COVID  ?or if you develop a fever of 100.4 or greater, sneezing, cough, sore throat, shortness of breath or body aches. ? ?  ?Please read over the following fact sheets that you were given.  ? ?

## 2021-12-04 ENCOUNTER — Encounter (HOSPITAL_COMMUNITY): Payer: Self-pay

## 2021-12-04 ENCOUNTER — Encounter (HOSPITAL_COMMUNITY)
Admission: RE | Admit: 2021-12-04 | Discharge: 2021-12-04 | Disposition: A | Discharge status: Home / Self Care | Payer: Medicare Other | Source: Ambulatory Visit | Attending: Vascular Surgery | Admitting: Vascular Surgery

## 2021-12-04 ENCOUNTER — Other Ambulatory Visit: Payer: Self-pay

## 2021-12-04 VITALS — BP 135/93 | HR 76 | Temp 98.5°F | Resp 19 | Ht 71.0 in | Wt 201.8 lb

## 2021-12-04 DIAGNOSIS — R053 Chronic cough: Secondary | ICD-10-CM | POA: Diagnosis not present

## 2021-12-04 DIAGNOSIS — J449 Chronic obstructive pulmonary disease, unspecified: Secondary | ICD-10-CM | POA: Insufficient documentation

## 2021-12-04 DIAGNOSIS — Z20822 Contact with and (suspected) exposure to covid-19: Secondary | ICD-10-CM | POA: Diagnosis not present

## 2021-12-04 DIAGNOSIS — I429 Cardiomyopathy, unspecified: Secondary | ICD-10-CM | POA: Insufficient documentation

## 2021-12-04 DIAGNOSIS — I7123 Aneurysm of the descending thoracic aorta, without rupture: Secondary | ICD-10-CM

## 2021-12-04 DIAGNOSIS — Z87891 Personal history of nicotine dependence: Secondary | ICD-10-CM | POA: Diagnosis not present

## 2021-12-04 DIAGNOSIS — Z01812 Encounter for preprocedural laboratory examination: Secondary | ICD-10-CM | POA: Insufficient documentation

## 2021-12-04 DIAGNOSIS — I2510 Atherosclerotic heart disease of native coronary artery without angina pectoris: Secondary | ICD-10-CM | POA: Insufficient documentation

## 2021-12-04 DIAGNOSIS — Z01818 Encounter for other preprocedural examination: Secondary | ICD-10-CM

## 2021-12-04 LAB — COMPREHENSIVE METABOLIC PANEL
ALT: 21 U/L (ref 0–44)
AST: 22 U/L (ref 15–41)
Albumin: 3.7 g/dL (ref 3.5–5.0)
Alkaline Phosphatase: 51 U/L (ref 38–126)
Anion gap: 8 (ref 5–15)
BUN: 12 mg/dL (ref 8–23)
CO2: 25 mmol/L (ref 22–32)
Calcium: 9.1 mg/dL (ref 8.9–10.3)
Chloride: 107 mmol/L (ref 98–111)
Creatinine, Ser: 1.17 mg/dL (ref 0.61–1.24)
GFR, Estimated: >60 mL/min (ref >60)
Glucose, Bld: 106 mg/dL — ABNORMAL HIGH (ref 70–99)
Potassium: 4.4 mmol/L (ref 3.5–5.1)
Sodium: 140 mmol/L (ref 135–145)
Total Bilirubin: 0.8 mg/dL (ref 0.3–1.2)
Total Protein: 6.5 g/dL (ref 6.5–8.1)

## 2021-12-04 LAB — CBC
HCT: 47 % (ref 39.0–52.0)
Hemoglobin: 16.3 g/dL (ref 13.0–17.0)
MCH: 31.2 pg (ref 26.0–34.0)
MCHC: 34.7 g/dL (ref 30.0–36.0)
MCV: 89.9 fL (ref 80.0–100.0)
Platelets: 191 10*3/uL (ref 150–400)
RBC: 5.23 MIL/uL (ref 4.22–5.81)
RDW: 13.3 % (ref 11.5–15.5)
WBC: 7.9 10*3/uL (ref 4.0–10.5)
nRBC: 0 % (ref 0.0–0.2)

## 2021-12-04 LAB — URINALYSIS, ROUTINE W REFLEX MICROSCOPIC
Bilirubin Urine: NEGATIVE
Glucose, UA: NEGATIVE mg/dL
Hgb urine dipstick: NEGATIVE
Ketones, ur: NEGATIVE mg/dL
Leukocytes,Ua: NEGATIVE
Nitrite: NEGATIVE
Protein, ur: NEGATIVE mg/dL
Specific Gravity, Urine: 1.009 (ref 1.005–1.030)
pH: 5 (ref 5.0–8.0)

## 2021-12-04 LAB — PROTIME-INR
INR: 1 (ref 0.8–1.2)
Prothrombin Time: 13.3 seconds (ref 11.4–15.2)

## 2021-12-04 LAB — SURGICAL PCR SCREEN
MRSA, PCR: POSITIVE — AB
Staphylococcus aureus: POSITIVE — AB

## 2021-12-04 LAB — APTT: aPTT: 31 seconds (ref 24–36)

## 2021-12-04 NOTE — Progress Notes (Signed)
Received a call from the lab that patient's PCR positive for staph and MRSA. Solon Palm, RN with Dr. Carlis Abbott made aware and will notify Dr. Carlis Abbott.  ?

## 2021-12-04 NOTE — Progress Notes (Signed)
PCP - Dr. Wenda Low ?Cardiologist - Dr. Jenkins Rouge ? ?PPM/ICD - n/a ?Device Orders - n/a ?Rep Notified - n/a ? ?Chest x-ray - 04/23/21 ?EKG - 03/05/21 ?Stress Test - 12/01/19 ?ECHO - 03/09/21 ?Cardiac Cath - 03/08/20 ? ?Sleep Study - denies ?CPAP - n/a ? ?Fasting Blood Sugar - n/a ?Checks Blood Sugar _____ times a day- n/a ? ?Blood Thinner Instructions: n/a ?Aspirin Instructions: Continue taking Aspirin per surgeon's instructions.  ? ?ERAS Protcol - No. NPO ?PRE-SURGERY Ensure or G2- n/a ? ?COVID TEST- 12/04/21. Pending.  ? ? ?Anesthesia review: Yes. Cardiac History.  ? ?Patient denies shortness of breath, fever, cough and chest pain at PAT appointment ? ? ?All instructions explained to the patient, with a verbal understanding of the material. Patient agrees to go over the instructions while at home for a better understanding. Patient also instructed to self quarantine after being tested for COVID-19. The opportunity to ask questions was provided. ? ? ?

## 2021-12-05 LAB — SARS CORONAVIRUS 2 (TAT 6-24 HRS): SARS Coronavirus 2: NEGATIVE

## 2021-12-05 NOTE — Anesthesia Preprocedure Evaluation (Addendum)
Anesthesia Evaluation  ?Patient identified by MRN, date of birth, ID band ?Patient awake ? ? ? ?Reviewed: ?Allergy & Precautions, NPO status , Patient's Chart, lab work & pertinent test results ? ?Airway ?Mallampati: II ? ?TM Distance: >3 FB ?Neck ROM: Full ? ? ? Dental ? ?(+) Edentulous Upper, Poor Dentition, Missing, Partial Lower, Dental Advisory Given ?  ?Pulmonary ?asthma , COPD, former smoker,  ?  ?Pulmonary exam normal ?breath sounds clear to auscultation ? ? ? ? ? ? Cardiovascular ?+ CAD  ?Normal cardiovascular exam ?Rhythm:Regular Rate:Normal ? ?Echo 02/2021 ??1. Left ventricular ejection fraction, by estimation, is 60 to 65%. The left ventricle has normal function. Left ventricular endocardial border not optimally defined to evaluate regional wall motion. Indeterminate  ?diastolic filling due to E-A fusion.  ??2. Right ventricular systolic function was not well visualized. The right ventricular size is normal. Tricuspid regurgitation signal is inadequate for assessing PA pressure.  ??3. The mitral valve was not well visualized. No evidence of mitral valve regurgitation. No evidence of mitral stenosis.  ??4. The aortic valve was not assessed. Aortic valve regurgitation is not visualized.  ??5. The inferior vena cava is dilated in size with >50% respiratory variability, suggesting right atrial pressure of 8 mmHg.  ?  ?Neuro/Psych ?negative neurological ROS ?   ? GI/Hepatic ?negative GI ROS, Neg liver ROS,   ?Endo/Other  ?negative endocrine ROS ? Renal/GU ?negative Renal ROS  ? ?  ?Musculoskeletal ?negative musculoskeletal ROS ?(+)  ? Abdominal ?  ?Peds ? Hematology ?negative hematology ROS ?(+)   ?Anesthesia Other Findings ? ? Reproductive/Obstetrics ? ?  ? ? ? ? ? ? ? ? ? ? ? ? ? ?  ?  ? ? ? ? ? ?Anesthesia Physical ?Anesthesia Plan ? ?ASA: 3 ? ?Anesthesia Plan: General  ? ?Post-op Pain Management: Minimal or no pain anticipated  ? ?Induction: Intravenous ? ?PONV Risk Score  and Plan: 2 and Ondansetron, Treatment may vary due to age or medical condition and Dexamethasone ? ?Airway Management Planned: Oral ETT ? ?Additional Equipment: Arterial line and CVP ? ?Intra-op Plan:  ? ?Post-operative Plan: Extubation in OR ? ?Informed Consent: I have reviewed the patients History and Physical, chart, labs and discussed the procedure including the risks, benefits and alternatives for the proposed anesthesia with the patient or authorized representative who has indicated his/her understanding and acceptance.  ? ? ? ?Dental advisory given ? ?Plan Discussed with: CRNA ? ?Anesthesia Plan Comments: (PAT note by Karoline Caldwell, PA-C: ?Follows with cardiology for history of nonobstructive CAD by cath 02/2020, dilated cardiomyopathy (EF normalized 60 to 65% by most recent echo 02/2021).  Last seen by Dr. Johnsie Cancel 11/17/2021.  Stable from cardiac standpoint.  Discussed upcoming stent graft repair of descending thoracic aneurysm. ? ?Follows with pulmonologist Dr. Chase Caller for history of COPD Gold 1, chronic cough, DOE, 90-pack-year smoking history (reportedly quit June 2022). PFT 04/22/21: FVC 3.54 (92%), FEV1 2.27 (81%), ratio 64, TLC 113%, DLCOcor 14.05 (60%).  Last seen 07/21/2021, noted to be doing well with minimal symptom burden.  Maintained on Breo history and nocturnal O2.   ? ?Preop labs reviewed, WNL. ? ?EKG 03/04/2021: Sinus tachycardia.  Rate 104. ? ?CTA 10/21/2021: ?IMPRESSION: ?CTA CHEST ?? ?1. Eccentric aneurysmal dilation of the descending thoracic aorta ?with a maximal diameter of 5.6 cm. ?2. Normal caliber of the aortic root. ?3. Ectatic bordering on aneurysmal ascending thoracic aorta with a ?maximal diameter of 3.9 cm. ?4. Advanced centrilobular pulmonary emphysema. ?5. Scattered areas  of bronchial impaction and tree-in-bud micro ?nodularity superimposed on a diffuse background of mild bronchial ?wall thickening likely reflecting chronic bronchitis. ?? ?CTA ABD/PELVIS ?? ?1. Fusiform aneurysmal  dilation of the infrarenal abdominal aorta ?with a maximal diameter of 3.5 cm. Recommend follow-up every 2 ?years. This recommendation follows ACR consensus guidelines: White ?Paper of the ACR Incidental Findings Committee II on Vascular ?Findings. J Am Coll Radiol 2013; 10:789-794. ?2. Cholelithiasis. ?3. Colonic diverticular disease without CT evidence of active ?inflammation. ?4. Additional ancillary findings as above. ? ?TTE 03/09/2021: ??1. Left ventricular ejection fraction, by estimation, is 60 to 65%. The  ?left ventricle has normal function. Left ventricular endocardial border  ?not optimally defined to evaluate regional wall motion. Indeterminate  ?diastolic filling due to E-A fusion.  ??2. Right ventricular systolic function was not well visualized. The right  ?ventricular size is normal. Tricuspid regurgitation signal is inadequate  ?for assessing PA pressure.  ??3. The mitral valve was not well visualized. No evidence of mitral valve  ?regurgitation. No evidence of mitral stenosis.  ??4. The aortic valve was not assessed. Aortic valve regurgitation is not  ?visualized.  ??5. The inferior vena cava is dilated in size with >50% respiratory  ?variability, suggesting right atrial pressure of 8 mmHg.  ? ?Right/left heart cath 03/08/2020: ?? Prox RCA lesion is 30% stenosed. ?? Mid RCA to Dist RCA lesion is 20% stenosed. ?? Prox LAD to Mid LAD lesion is 20% stenosed. ?? ?1. Mild non-obstructive CAD ?2. Normal right and left heart pressures.  ?? ?Recommendations: Medical management of mild non-obstructive CAD.  ?? ? ?)  ? ? ? ?Anesthesia Quick Evaluation ? ?

## 2021-12-05 NOTE — Progress Notes (Signed)
Anesthesia Chart Review: ? ?Follows with cardiology for history of nonobstructive CAD by cath 02/2020, dilated cardiomyopathy (EF normalized 60 to 65% by most recent echo 02/2021).  Last seen by Dr. Johnsie Cancel 11/17/2021.  Stable from cardiac standpoint.  Discussed upcoming stent graft repair of descending thoracic aneurysm. ? ?Follows with pulmonologist Dr. Chase Caller for history of COPD Gold 1, chronic cough, DOE, 90-pack-year smoking history (reportedly quit June 2022). PFT 04/22/21: FVC 3.54 (92%), FEV1 2.27 (81%), ratio 64, TLC 113%, DLCOcor 14.05 (60%).  Last seen 07/21/2021, noted to be doing well with minimal symptom burden.  Maintained on Breo history and nocturnal O2.   ? ?Preop labs reviewed, WNL. ? ?EKG 03/04/2021: Sinus tachycardia.  Rate 104. ? ?CTA 10/21/2021: ?IMPRESSION: ?CTA CHEST ?  ?1. Eccentric aneurysmal dilation of the descending thoracic aorta ?with a maximal diameter of 5.6 cm. ?2. Normal caliber of the aortic root. ?3. Ectatic bordering on aneurysmal ascending thoracic aorta with a ?maximal diameter of 3.9 cm. ?4. Advanced centrilobular pulmonary emphysema. ?5. Scattered areas of bronchial impaction and tree-in-bud micro ?nodularity superimposed on a diffuse background of mild bronchial ?wall thickening likely reflecting chronic bronchitis. ?  ?CTA ABD/PELVIS ?  ?1. Fusiform aneurysmal dilation of the infrarenal abdominal aorta ?with a maximal diameter of 3.5 cm. Recommend follow-up every 2 ?years. This recommendation follows ACR consensus guidelines: White ?Paper of the ACR Incidental Findings Committee II on Vascular ?Findings. J Am Coll Radiol 2013; 10:789-794. ?2. Cholelithiasis. ?3. Colonic diverticular disease without CT evidence of active ?inflammation. ?4. Additional ancillary findings as above. ? ?TTE 03/09/2021: ? 1. Left ventricular ejection fraction, by estimation, is 60 to 65%. The  ?left ventricle has normal function. Left ventricular endocardial border  ?not optimally defined to evaluate  regional wall motion. Indeterminate  ?diastolic filling due to E-A fusion.  ? 2. Right ventricular systolic function was not well visualized. The right  ?ventricular size is normal. Tricuspid regurgitation signal is inadequate  ?for assessing PA pressure.  ? 3. The mitral valve was not well visualized. No evidence of mitral valve  ?regurgitation. No evidence of mitral stenosis.  ? 4. The aortic valve was not assessed. Aortic valve regurgitation is not  ?visualized.  ? 5. The inferior vena cava is dilated in size with >50% respiratory  ?variability, suggesting right atrial pressure of 8 mmHg.  ? ?Right/left heart cath 03/08/2020: ?Prox RCA lesion is 30% stenosed. ?Mid RCA to Dist RCA lesion is 20% stenosed. ?Prox LAD to Mid LAD lesion is 20% stenosed. ?  ?1. Mild non-obstructive CAD ?2. Normal right and left heart pressures.  ?  ?Recommendations: Medical management of mild non-obstructive CAD.  ?  ? ? ?Karoline Caldwell, PA-C ?[facility]/Anesthesiology ?Phone [phone] ?12/05/2021 10:12 AM ? ?

## 2021-12-08 ENCOUNTER — Inpatient Hospital Stay (HOSPITAL_COMMUNITY): Payer: Medicare Other

## 2021-12-08 ENCOUNTER — Encounter (HOSPITAL_COMMUNITY): Payer: Self-pay | Admitting: Vascular Surgery

## 2021-12-08 ENCOUNTER — Inpatient Hospital Stay (HOSPITAL_COMMUNITY): Payer: Medicare Other | Admitting: Registered Nurse

## 2021-12-08 ENCOUNTER — Inpatient Hospital Stay (HOSPITAL_COMMUNITY): Payer: Medicare Other | Admitting: Physician Assistant

## 2021-12-08 ENCOUNTER — Inpatient Hospital Stay (HOSPITAL_COMMUNITY)
Admission: RE | Admit: 2021-12-08 | Discharge: 2021-12-09 | DRG: 220 | Disposition: A | Discharge status: Home Health | Payer: Medicare Other | Attending: Vascular Surgery | Admitting: Vascular Surgery

## 2021-12-08 ENCOUNTER — Encounter (HOSPITAL_COMMUNITY)
Admission: RE | Disposition: A | Discharge status: Home / Self Care | Payer: Self-pay | Source: Home / Self Care | Attending: Vascular Surgery

## 2021-12-08 ENCOUNTER — Other Ambulatory Visit: Payer: Self-pay

## 2021-12-08 DIAGNOSIS — J449 Chronic obstructive pulmonary disease, unspecified: Secondary | ICD-10-CM | POA: Diagnosis not present

## 2021-12-08 DIAGNOSIS — I7123 Aneurysm of the descending thoracic aorta, without rupture: Principal | ICD-10-CM | POA: Diagnosis present

## 2021-12-08 DIAGNOSIS — Y712 Prosthetic and other implants, materials and accessory cardiovascular devices associated with adverse incidents: Secondary | ICD-10-CM | POA: Diagnosis not present

## 2021-12-08 DIAGNOSIS — Z79899 Other long term (current) drug therapy: Secondary | ICD-10-CM | POA: Diagnosis not present

## 2021-12-08 DIAGNOSIS — I712 Thoracic aortic aneurysm, without rupture, unspecified: Secondary | ICD-10-CM | POA: Diagnosis present

## 2021-12-08 DIAGNOSIS — Z48813 Encounter for surgical aftercare following surgery on the respiratory system: Secondary | ICD-10-CM | POA: Diagnosis not present

## 2021-12-08 DIAGNOSIS — I2510 Atherosclerotic heart disease of native coronary artery without angina pectoris: Secondary | ICD-10-CM

## 2021-12-08 DIAGNOSIS — Z87891 Personal history of nicotine dependence: Secondary | ICD-10-CM | POA: Diagnosis not present

## 2021-12-08 DIAGNOSIS — Z9889 Other specified postprocedural states: Secondary | ICD-10-CM

## 2021-12-08 DIAGNOSIS — Z85828 Personal history of other malignant neoplasm of skin: Secondary | ICD-10-CM | POA: Diagnosis not present

## 2021-12-08 DIAGNOSIS — T82330A Leakage of aortic (bifurcation) graft (replacement), initial encounter: Secondary | ICD-10-CM | POA: Diagnosis not present

## 2021-12-08 LAB — POCT ACTIVATED CLOTTING TIME
Activated Clotting Time: 245 seconds
Activated Clotting Time: 263 seconds

## 2021-12-08 LAB — BASIC METABOLIC PANEL
Anion gap: 6 (ref 5–15)
BUN: 8 mg/dL (ref 8–23)
CO2: 25 mmol/L (ref 22–32)
Calcium: 8.5 mg/dL — ABNORMAL LOW (ref 8.9–10.3)
Chloride: 108 mmol/L (ref 98–111)
Creatinine, Ser: 0.95 mg/dL (ref 0.61–1.24)
GFR, Estimated: >60 mL/min (ref >60)
Glucose, Bld: 111 mg/dL — ABNORMAL HIGH (ref 70–99)
Potassium: 4 mmol/L (ref 3.5–5.1)
Sodium: 139 mmol/L (ref 135–145)

## 2021-12-08 LAB — PROTIME-INR
INR: 1.2 (ref 0.8–1.2)
Prothrombin Time: 14.8 seconds (ref 11.4–15.2)

## 2021-12-08 LAB — CBC
HCT: 42.2 % (ref 39.0–52.0)
Hemoglobin: 14.9 g/dL (ref 13.0–17.0)
MCH: 31.2 pg (ref 26.0–34.0)
MCHC: 35.3 g/dL (ref 30.0–36.0)
MCV: 88.5 fL (ref 80.0–100.0)
Platelets: 158 10*3/uL (ref 150–400)
RBC: 4.77 MIL/uL (ref 4.22–5.81)
RDW: 13.3 % (ref 11.5–15.5)
WBC: 9.4 10*3/uL (ref 4.0–10.5)
nRBC: 0 % (ref 0.0–0.2)

## 2021-12-08 LAB — APTT: aPTT: 40 seconds — ABNORMAL HIGH (ref 24–36)

## 2021-12-08 LAB — ABO/RH: ABO/RH(D): O POS

## 2021-12-08 LAB — PREPARE RBC (CROSSMATCH)

## 2021-12-08 LAB — MAGNESIUM: Magnesium: 2.1 mg/dL (ref 1.7–2.4)

## 2021-12-08 SURGERY — INSERTION, ENDOVASCULAR STENT GRAFT, AORTA, THORACIC
Anesthesia: General

## 2021-12-08 MED ORDER — PHENYLEPHRINE HCL-NACL 20-0.9 MG/250ML-% IV SOLN
INTRAVENOUS | Status: DC | PRN
Start: 2021-12-08 — End: 2021-12-08
  Administered 2021-12-08: 50 ug/min via INTRAVENOUS

## 2021-12-08 MED ORDER — CEFAZOLIN SODIUM-DEXTROSE 2-4 GM/100ML-% IV SOLN
2.0000 g | INTRAVENOUS | Status: DC
Start: 2021-12-08 — End: 2021-12-08
  Filled 2021-12-08: qty 100

## 2021-12-08 MED ORDER — ACETAMINOPHEN 500 MG PO TABS: 500.0000 mg | ORAL_TABLET | Freq: Four times a day (QID) | ORAL | Status: DC | PRN

## 2021-12-08 MED ORDER — LABETALOL HCL 5 MG/ML IV SOLN: 10.0000 mg | INTRAVENOUS | Status: DC | PRN

## 2021-12-08 MED ORDER — FENTANYL CITRATE (PF) 100 MCG/2ML IJ SOLN: 25.0000 ug | INTRAMUSCULAR | Status: DC | PRN

## 2021-12-08 MED ORDER — DEXAMETHASONE SODIUM PHOSPHATE 10 MG/ML IJ SOLN
INTRAMUSCULAR | Status: AC
  Filled 2021-12-08: qty 1

## 2021-12-08 MED ORDER — PROPOFOL 10 MG/ML IV BOLUS
INTRAVENOUS | Status: DC | PRN
  Administered 2021-12-08: 100 mg via INTRAVENOUS

## 2021-12-08 MED ORDER — CHLORHEXIDINE GLUCONATE CLOTH 2 % EX PADS: 6.0000 | MEDICATED_PAD | Freq: Once | CUTANEOUS | Status: DC

## 2021-12-08 MED ORDER — ROCURONIUM BROMIDE 10 MG/ML (PF) SYRINGE
PREFILLED_SYRINGE | INTRAVENOUS | Status: DC | PRN
Start: 2021-12-08 — End: 2021-12-08
  Administered 2021-12-08: 60 mg via INTRAVENOUS
  Administered 2021-12-08: 40 mg via INTRAVENOUS

## 2021-12-08 MED ORDER — MIDAZOLAM HCL 2 MG/2ML IJ SOLN
INTRAMUSCULAR | Status: DC | PRN
  Administered 2021-12-08 (×2): 1 mg via INTRAVENOUS

## 2021-12-08 MED ORDER — DOCUSATE SODIUM 100 MG PO CAPS
100.0000 mg | ORAL_CAPSULE | Freq: Every day | ORAL | Status: DC
  Administered 2021-12-09: 100 mg via ORAL
  Filled 2021-12-08: qty 1

## 2021-12-08 MED ORDER — IODIXANOL 320 MG/ML IV SOLN
INTRAVENOUS | Status: DC | PRN
  Administered 2021-12-08: 40 mL

## 2021-12-08 MED ORDER — MEPERIDINE HCL 25 MG/ML IJ SOLN: 6.2500 mg | INTRAMUSCULAR | Status: DC | PRN

## 2021-12-08 MED ORDER — CHLORHEXIDINE GLUCONATE 0.12 % MT SOLN
15.0000 mL | Freq: Once | OROMUCOSAL | Status: AC
  Administered 2021-12-08: 15 mL via OROMUCOSAL
  Filled 2021-12-08: qty 15

## 2021-12-08 MED ORDER — PROPOFOL 10 MG/ML IV BOLUS
INTRAVENOUS | Status: AC
  Filled 2021-12-08: qty 20

## 2021-12-08 MED ORDER — CARVEDILOL 3.125 MG PO TABS
3.1250 mg | ORAL_TABLET | Freq: Two times a day (BID) | ORAL | Status: DC
  Administered 2021-12-08 – 2021-12-09 (×2): 3.125 mg via ORAL
  Filled 2021-12-08 (×2): qty 1

## 2021-12-08 MED ORDER — ACETAMINOPHEN 650 MG RE SUPP: 325.0000 mg | RECTAL | Status: DC | PRN

## 2021-12-08 MED ORDER — ORAL CARE MOUTH RINSE: 15.0000 mL | Freq: Once | OROMUCOSAL | Status: AC

## 2021-12-08 MED ORDER — GUAIFENESIN-DM 100-10 MG/5ML PO SYRP: 15.0000 mL | ORAL_SOLUTION | ORAL | Status: DC | PRN

## 2021-12-08 MED ORDER — CEFAZOLIN SODIUM-DEXTROSE 2-4 GM/100ML-% IV SOLN
2.0000 g | Freq: Three times a day (TID) | INTRAVENOUS | Status: AC
  Administered 2021-12-08 (×2): 2 g via INTRAVENOUS
  Filled 2021-12-08 (×2): qty 100

## 2021-12-08 MED ORDER — ASPIRIN EC 81 MG PO TBEC
81.0000 mg | DELAYED_RELEASE_TABLET | Freq: Every day | ORAL | Status: DC
  Administered 2021-12-08 – 2021-12-09 (×2): 81 mg via ORAL
  Filled 2021-12-08 (×2): qty 1

## 2021-12-08 MED ORDER — PROTAMINE SULFATE 10 MG/ML IV SOLN
INTRAVENOUS | Status: DC | PRN
  Administered 2021-12-08: 50 mg via INTRAVENOUS

## 2021-12-08 MED ORDER — BUDESON-GLYCOPYRROL-FORMOTEROL 160-9-4.8 MCG/ACT IN AERO: 2.0000 | INHALATION_SPRAY | Freq: Two times a day (BID) | RESPIRATORY_TRACT | Status: DC

## 2021-12-08 MED ORDER — SODIUM CHLORIDE 0.9 % IV SOLN: 500.0000 mL | Freq: Once | INTRAVENOUS | Status: DC | PRN

## 2021-12-08 MED ORDER — PANTOPRAZOLE SODIUM 40 MG PO TBEC
40.0000 mg | DELAYED_RELEASE_TABLET | Freq: Every day | ORAL | Status: DC
  Administered 2021-12-08 – 2021-12-09 (×2): 40 mg via ORAL
  Filled 2021-12-08 (×2): qty 1

## 2021-12-08 MED ORDER — DROPERIDOL 2.5 MG/ML IJ SOLN: 0.6250 mg | Freq: Once | INTRAMUSCULAR | Status: DC | PRN

## 2021-12-08 MED ORDER — BENZONATATE 100 MG PO CAPS: 200.0000 mg | ORAL_CAPSULE | Freq: Three times a day (TID) | ORAL | Status: DC | PRN

## 2021-12-08 MED ORDER — SODIUM CHLORIDE 0.9 % IV SOLN: INTRAVENOUS | Status: DC

## 2021-12-08 MED ORDER — LABETALOL HCL 5 MG/ML IV SOLN
INTRAVENOUS | Status: DC | PRN
  Administered 2021-12-08: 10 mg via INTRAVENOUS

## 2021-12-08 MED ORDER — ONDANSETRON HCL 4 MG/2ML IJ SOLN
INTRAMUSCULAR | Status: DC | PRN
  Administered 2021-12-08: 4 mg via INTRAVENOUS

## 2021-12-08 MED ORDER — ACETAMINOPHEN 325 MG PO TABS: 325.0000 mg | ORAL_TABLET | ORAL | Status: DC | PRN

## 2021-12-08 MED ORDER — DEXAMETHASONE SODIUM PHOSPHATE 10 MG/ML IJ SOLN
INTRAMUSCULAR | Status: DC | PRN
  Administered 2021-12-08: 10 mg via INTRAVENOUS

## 2021-12-08 MED ORDER — UMECLIDINIUM BROMIDE 62.5 MCG/ACT IN AEPB
1.0000 | INHALATION_SPRAY | Freq: Every day | RESPIRATORY_TRACT | Status: DC
  Filled 2021-12-08: qty 7

## 2021-12-08 MED ORDER — ATORVASTATIN CALCIUM 40 MG PO TABS
40.0000 mg | ORAL_TABLET | Freq: Every day | ORAL | Status: DC
  Administered 2021-12-08 – 2021-12-09 (×2): 40 mg via ORAL
  Filled 2021-12-08 (×2): qty 1

## 2021-12-08 MED ORDER — HEPARIN 6000 UNIT IRRIGATION SOLUTION
Status: DC | PRN
  Administered 2021-12-08: 1

## 2021-12-08 MED ORDER — PHENOL 1.4 % MT LIQD: 1.0000 | OROMUCOSAL | Status: DC | PRN

## 2021-12-08 MED ORDER — MAGNESIUM SULFATE 2 GM/50ML IV SOLN: 2.0000 g | Freq: Every day | INTRAVENOUS | Status: DC | PRN

## 2021-12-08 MED ORDER — SUGAMMADEX SODIUM 200 MG/2ML IV SOLN
INTRAVENOUS | Status: DC | PRN
  Administered 2021-12-08: 200 mg via INTRAVENOUS

## 2021-12-08 MED ORDER — FENTANYL CITRATE (PF) 250 MCG/5ML IJ SOLN
INTRAMUSCULAR | Status: DC | PRN
Start: 2021-12-08 — End: 2021-12-08
  Administered 2021-12-08: 50 ug via INTRAVENOUS
  Administered 2021-12-08: 100 ug via INTRAVENOUS
  Administered 2021-12-08: 50 ug via INTRAVENOUS

## 2021-12-08 MED ORDER — VANCOMYCIN HCL IN DEXTROSE 1-5 GM/200ML-% IV SOLN
1000.0000 mg | Freq: Once | INTRAVENOUS | Status: AC
  Administered 2021-12-08: 1000 mg via INTRAVENOUS
  Filled 2021-12-08: qty 200

## 2021-12-08 MED ORDER — MIDAZOLAM HCL 2 MG/2ML IJ SOLN
INTRAMUSCULAR | Status: AC
  Filled 2021-12-08: qty 2

## 2021-12-08 MED ORDER — MOMETASONE FURO-FORMOTEROL FUM 100-5 MCG/ACT IN AERO
2.0000 | INHALATION_SPRAY | Freq: Two times a day (BID) | RESPIRATORY_TRACT | Status: DC
  Administered 2021-12-08 – 2021-12-09 (×2): 2 via RESPIRATORY_TRACT
  Filled 2021-12-08: qty 8.8

## 2021-12-08 MED ORDER — ONDANSETRON HCL 4 MG/2ML IJ SOLN
INTRAMUSCULAR | Status: AC
  Filled 2021-12-08: qty 2

## 2021-12-08 MED ORDER — ALUM & MAG HYDROXIDE-SIMETH 200-200-20 MG/5ML PO SUSP: 15.0000 mL | ORAL | Status: DC | PRN

## 2021-12-08 MED ORDER — ALBUTEROL SULFATE HFA 108 (90 BASE) MCG/ACT IN AERS: 1.0000 | INHALATION_SPRAY | Freq: Four times a day (QID) | RESPIRATORY_TRACT | Status: DC | PRN

## 2021-12-08 MED ORDER — HEPARIN SODIUM (PORCINE) 1000 UNIT/ML IJ SOLN
INTRAMUSCULAR | Status: DC | PRN
  Administered 2021-12-08: 10000 [IU] via INTRAVENOUS
  Administered 2021-12-08: 2000 [IU] via INTRAVENOUS

## 2021-12-08 MED ORDER — ROCURONIUM BROMIDE 10 MG/ML (PF) SYRINGE
PREFILLED_SYRINGE | INTRAVENOUS | Status: AC
  Filled 2021-12-08: qty 10

## 2021-12-08 MED ORDER — LACTATED RINGERS IV SOLN: INTRAVENOUS | Status: DC

## 2021-12-08 MED ORDER — ALBUTEROL SULFATE (2.5 MG/3ML) 0.083% IN NEBU: 2.5000 mg | INHALATION_SOLUTION | RESPIRATORY_TRACT | Status: DC | PRN

## 2021-12-08 MED ORDER — HYDRALAZINE HCL 20 MG/ML IJ SOLN: 5.0000 mg | INTRAMUSCULAR | Status: DC | PRN

## 2021-12-08 MED ORDER — IODIXANOL 320 MG/ML IV SOLN
INTRAVENOUS | Status: DC | PRN
  Administered 2021-12-08: 150 mL

## 2021-12-08 MED ORDER — ONDANSETRON HCL 4 MG/2ML IJ SOLN: 4.0000 mg | Freq: Four times a day (QID) | INTRAMUSCULAR | Status: DC | PRN

## 2021-12-08 MED ORDER — FENTANYL CITRATE (PF) 250 MCG/5ML IJ SOLN
INTRAMUSCULAR | Status: AC
Start: 2021-12-08 — End: ?
  Filled 2021-12-08: qty 5

## 2021-12-08 MED ORDER — POTASSIUM CHLORIDE CRYS ER 20 MEQ PO TBCR: 20.0000 meq | EXTENDED_RELEASE_TABLET | Freq: Every day | ORAL | Status: DC | PRN

## 2021-12-08 MED ORDER — LIDOCAINE 2% (20 MG/ML) 5 ML SYRINGE
INTRAMUSCULAR | Status: AC
  Filled 2021-12-08: qty 5

## 2021-12-08 MED ORDER — HEPARIN 6000 UNIT IRRIGATION SOLUTION
Status: AC
  Filled 2021-12-08: qty 500

## 2021-12-08 MED ORDER — HEPARIN SODIUM (PORCINE) 5000 UNIT/ML IJ SOLN
5000.0000 [IU] | Freq: Three times a day (TID) | INTRAMUSCULAR | Status: DC
  Administered 2021-12-09: 5000 [IU] via SUBCUTANEOUS
  Filled 2021-12-08: qty 1

## 2021-12-08 MED ORDER — SACUBITRIL-VALSARTAN 97-103 MG PO TABS
1.0000 | ORAL_TABLET | Freq: Two times a day (BID) | ORAL | Status: DC
  Administered 2021-12-08 – 2021-12-09 (×3): 1 via ORAL
  Filled 2021-12-08 (×3): qty 1

## 2021-12-08 MED ORDER — METOPROLOL TARTRATE 5 MG/5ML IV SOLN: 2.0000 mg | INTRAVENOUS | Status: DC | PRN

## 2021-12-08 SURGICAL SUPPLY — 52 items
BAG COUNTER SPONGE SURGICOUNT (BAG) ×2 IMPLANT
CANISTER SUCT 3000ML PPV (MISCELLANEOUS) ×2 IMPLANT
CATH ACCU-VU SIZ PIG 5F 100CM (CATHETERS) ×1 IMPLANT
CLOSURE PERCLOSE PROSTYLE (VASCULAR PRODUCTS) ×1 IMPLANT
COVER PROBE W GEL 5X96 (DRAPES) ×2 IMPLANT
DERMABOND ADVANCED (GAUZE/BANDAGES/DRESSINGS) ×1
DERMABOND ADVANCED .7 DNX12 (GAUZE/BANDAGES/DRESSINGS) ×1 IMPLANT
DEVICE CLOSURE PERCLS PRGLD 6F (VASCULAR PRODUCTS) IMPLANT
DRSG TEGADERM 2-3/8X2-3/4 SM (GAUZE/BANDAGES/DRESSINGS) ×2 IMPLANT
DRYSEAL FLEXSHEATH 22FR 33CM (SHEATH) ×1
ELECT REM PT RETURN 9FT ADLT (ELECTROSURGICAL) ×4
ELECTRODE REM PT RTRN 9FT ADLT (ELECTROSURGICAL) ×2 IMPLANT
GLOVE SRG 8 PF TXTR STRL LF DI (GLOVE) ×1 IMPLANT
GLOVE SURG ENC MOIS LTX SZ7.5 (GLOVE) ×2 IMPLANT
GLOVE SURG UNDER POLY LF SZ8 (GLOVE) ×2
GOWN STRL REUS W/ TWL LRG LVL3 (GOWN DISPOSABLE) ×3 IMPLANT
GOWN STRL REUS W/ TWL XL LVL3 (GOWN DISPOSABLE) ×1 IMPLANT
GOWN STRL REUS W/TWL LRG LVL3 (GOWN DISPOSABLE) ×6
GOWN STRL REUS W/TWL XL LVL3 (GOWN DISPOSABLE) ×2
KIT BASIN OR (CUSTOM PROCEDURE TRAY) ×2 IMPLANT
KIT DILATOR VASC 18G NDL (KITS) ×1 IMPLANT
KIT TURNOVER KIT B (KITS) ×2 IMPLANT
NDL PERC 18GX7CM (NEEDLE) ×1 IMPLANT
NEEDLE PERC 18GX7CM (NEEDLE) ×2 IMPLANT
NS IRRIG 1000ML POUR BTL (IV SOLUTION) ×4 IMPLANT
PACK ENDOVASCULAR (PACKS) ×2 IMPLANT
PAD ARMBOARD 7.5X6 YLW CONV (MISCELLANEOUS) ×4 IMPLANT
PERCLOSE PROGLIDE 6F (VASCULAR PRODUCTS)
SET MICROPUNCTURE 5F STIFF (MISCELLANEOUS) ×2 IMPLANT
SHEATH AVANTI 11CM 8FR (SHEATH) IMPLANT
SHEATH DRYSEAL FLEX 22FR 33CM (SHEATH) IMPLANT
SHEATH PINNACLE 8F 10CM (SHEATH) ×1 IMPLANT
SPONGE T-LAP 12X12 ~~LOC~~+RFID (SPONGE) ×1 IMPLANT
SPONGE T-LAP 18X18 ~~LOC~~+RFID (SPONGE) ×1 IMPLANT
STAPLER VISISTAT 35W (STAPLE) IMPLANT
STENT GRFT THORAC ACS 40X40X10 (Endovascular Graft) ×1 IMPLANT
STENT GRFT THORAC ACS 40X40X15 (Endovascular Graft) ×1 IMPLANT
STOPCOCK MORSE 400PSI 3WAY (MISCELLANEOUS) ×2 IMPLANT
SUT ETHILON 3 0 PS 1 (SUTURE) IMPLANT
SUT MNCRL AB 4-0 PS2 18 (SUTURE) ×4 IMPLANT
SUT PROLENE 5 0 C 1 24 (SUTURE) IMPLANT
SUT VIC AB 2-0 CTX 36 (SUTURE) IMPLANT
SUT VIC AB 3-0 SH 27 (SUTURE)
SUT VIC AB 3-0 SH 27X BRD (SUTURE) IMPLANT
SYR 30ML LL (SYRINGE) IMPLANT
SYR 50ML LL SCALE MARK (SYRINGE) ×2 IMPLANT
TOWEL GREEN STERILE (TOWEL DISPOSABLE) ×2 IMPLANT
TRAY FOLEY MTR SLVR 16FR STAT (SET/KITS/TRAYS/PACK) ×2 IMPLANT
TUBING HIGH PRESSURE 120CM (CONNECTOR) ×2 IMPLANT
TUBING INJECTOR 48 (MISCELLANEOUS) ×1 IMPLANT
WIRE BENTSON .035X145CM (WIRE) ×1 IMPLANT
WIRE STIFF LUNDERQUIST 260CM (WIRE) ×1 IMPLANT

## 2021-12-08 NOTE — Anesthesia Procedure Notes (Signed)
Central Venous Catheter Insertion ?Performed by: Nolon Nations, MD, anesthesiologist ?Start/End3/13/2023 7:00 AM, 12/08/2021 7:22 AM ?Patient location: Pre-op. ?Preanesthetic checklist: patient identified, IV checked, site marked, risks and benefits discussed, surgical consent, monitors and equipment checked, pre-op evaluation, timeout performed and anesthesia consent ?Lidocaine 1% used for infiltration and patient sedated ?Hand hygiene performed  and maximum sterile barriers used  ?Catheter size: 8.5 Fr ?Sheath introducer ?Procedure performed using ultrasound guided technique. ?Ultrasound Notes:anatomy identified, needle tip was noted to be adjacent to the nerve/plexus identified, no ultrasound evidence of intravascular and/or intraneural injection and image(s) printed for medical record ?Attempts: 1 ?Following insertion, line sutured, dressing applied and Biopatch. ?Post procedure assessment: blood return through all ports, free fluid flow and no air ? ?Patient tolerated the procedure well with no immediate complications. ? ? ? ? ?

## 2021-12-08 NOTE — Op Note (Signed)
Date: December 08, 2021 ? ?Preoperative diagnosis: 5.8 cm descending thoracic aortic aneurysm ? ?Postoperative diagnosis: Same ? ?Procedure: ?1.  Ultrasound-guided access right common femoral artery for delivery of endograft with percutaneous closure ?2.  Arch aortogram and desending thoracic aortogram with interpretation ?3.  Treatment of descending thoracic aortic aneurysm with stent graft without coverage of the left subclavian artery (40 mm x 40 mm x 15 cm and 40 mm x 40 mm x 10 cm GORE TEVAR) ? ?Surgeon: Dr. Marty Heck, MD ? ?Assistant: Roxy Horseman, PA ? ?Indications: Patient is a 71 year old male seen in the office with a 5.8 cm descending thoracic aortic aneurysm.  He presents today for stent graft repair through transfemoral access after risks benefits discussed.  An assistant was needed for wire exchange and to keep the sheaths in place during delivery of the endograft. ? ?Findings: Transfemoral access of the right common femoral artery for delivery of endograft with percutaneous closure.  Initially deployed a 40 mm x 40 mm x 15 cm Gore thoracic endograft from the just above the celiac artery more proximal into the descending thoracic aorta in the aneurysm.  There was a type Ia endoleak.  We then had to place a second stent more proximally using a 40 mm x 40 mm x 10 cm with good overlap and had excellent seal of the descending thoracic aneurysm and no endoleak at completion. ? ?Anesthesia: General ? ?Details: Patient was taken to the operating room after informed consent was obtained.  Placed on operative table in supine position.  General endotracheal anesthesia was induced.  The abdominal wall and both groins were then prepped and draped in standard sterile fashion.  Timeout was performed.  Antibiotics were given.  Initially evaluated the right common femoral artery with ultrasound, it was patent, an image was saved.  This was accessed under ultrasound guidance in the common femoral artery  above the plaque in the common femoral artery with a micro access needle and placed a microwire.  I then made a stab incision around the microwire placed a micro sheath and then a Bentson wire.  Dilated with a 8 French dilator over the Bentson wire and then placed Perclose devices at 10:00 and 2:00 with fluoroscopic guidance.  I then placed a short 8 French sheath in the right common femoral artery and the patient was given 100 units/kg IV heparin.  ACT was checked to maintain greater than 250.  A pigtail catheter was advanced into the ascending aorta and exchanged for a double curved Lundy wire.  I then used sequential dilators including a 12, 16 and 20 French dilator in the right common femoral artery and then placed a 22 French Gore dry seal sheath in the right common femoral artery with the help of my assistant.  At that point in time, we then placed our 40 mm x 40 mm x 15 cm Gore thoracic endograft into the descending thoracic aorta.  I buddy wired a pigtail catheter.  We then got steep oblique imaging to identify the takeoff of the celiac artery.  Ultimately I had to get 90 degree lateral imaging to see the takeoff of the celiac artery.  At that point in time the endograft was deployed from the just above the celiac artery into the descending thoracic aorta more proximally using 40 mm x 40 mm x 15 cm.  We got another image that showed preserved runoff in the celiac and SMA.  We went back to AP imaging and  shot another image once we moved the pigtail catheter up proximal to the endograft.  There was a type Ia endoleak as we did not have enough seal proximally in the aneurysm.  We then placed a second 40 mm x 40 mm x 10 cm endograft that was half overlapped into the existing endograft and deployed more proximally in the descending thoracic aorta.  We shot a final aortogram here that showed excellent seal with a widely patent endograft and no filling of the aneurysm and no further endoleak.  At that point in  time, we pulled our sheath back into the right external iliac artery and a hand-injection was performed that showed no evidence of dissection.  We then removed the sheath holding manual pressure over a Bentson wire and Perclose devices were tied down at 10:00 and 2:00 with good hemostasis in the right common femoral artery.  Excellent palpable pulse in the right foot.  50 mg protamine was given.  The groin was closed with 4-0 Monocryl subcuticular and Dermabond.  Taken to holding in stable condition. ? ?Complication: None ? ?Condition: Stable ? ?Marty Heck, MD ?[facility] ?Office: [phone] ? ? ?Marty Heck ? ? ?

## 2021-12-08 NOTE — Progress Notes (Signed)
?  Transition of Care (TOC) Screening Note ? ? ?Patient Details  ?Name: Mark Tate ?Date of Birth: 21-Nov-1950 ? ? ?Transition of Care [facility]) CM/SW Contact:    ?Ella Bodo, RN ?Phone Number: ?12/08/2021, 3:35 PM ? ? ? ?Transition of Care Department [facility]) has reviewed patient and no TOC needs have been identified at this time. We will continue to monitor patient advancement through interdisciplinary progression rounds. If new patient transition needs arise, please place a TOC consult. ? ?Reinaldo Raddle, RN, BSN  ?Trauma/Neuro ICU Case Manager ?[phone] ? ?

## 2021-12-08 NOTE — Progress Notes (Addendum)
Pt arrived from PACU, VSS, CHG complete, orders checked and released. Oriented to unit, call light within reach, groin level 0.  ? ?Chrisandra Carota, RN ?12/08/2021 ?11:12 AM  ?

## 2021-12-08 NOTE — H&P (Signed)
History and Physical Interval Note:  12/08/2021 7:38 AM  Mark Tate  has presented today for surgery, with the diagnosis of Aneurysm of descending thoracic aorta without rupture.  The various methods of treatment have been discussed with the patient and family. After consideration of risks, benefits and other options for treatment, the patient has consented to  Procedure(s): THORACIC AORTIC ENDOVASCULAR STENT GRAFT (N/A) as a surgical intervention.  The patient's history has been reviewed, patient examined, no change in status, stable for surgery.  I have reviewed the patient's chart and labs.  Questions were answered to the patient's satisfaction.    TEVAR for descending thoracic aneurysm.  Risk benefits again discussed including risk of bleeding, groin complication, infection, small risk of stroke, paralysis etc.  Mark Tate  Patient name: Mark Tate MRN: [id number]        DOB: 02-Nov-1950            Sex: male   REASON FOR CONSULT: F/U after CTA C/A/P to further evaluate descending thoracic aneurysm   HPI: Mark Tate is a 71 y.o. male, with history of COPD that presents for follow-up after CTA to further evaluate descending thoracic aneurysm.  Patient was referred for thoracic aneurysm earlier this year and stated he had no knowledge of this aneurysm until recent CT chest this year for cancer screening.  In review of the notes it was identified on CT chest 05/23/2019 at which time it measured 4.3 cm.  Most recent CT on 10/22/2021 shows it measured 5.4 cm in maximal diameter.     CTA chest was ordered to evaluate femoral/iliac access and options for stent graft repair.  He reports no changes today.  Still has quit smoking since Christmas.  No chest or back pain.       Past Medical History:  Diagnosis Date   Allergies     Asthma     CAD in native artery 01/03/2020   COPD (chronic obstructive pulmonary disease) (HCC)     COPD (chronic obstructive pulmonary disease) ([place]) 01/03/2020    Coughing     Fatigue     Neck swelling     Skin cancer     SOB (shortness of breath) on exertion     Tobacco use 01/03/2020   Wheezing             Past Surgical History:  Procedure Laterality Date   BASAL CELL CARCINOMA EXCISION        NECK   CATARACT EXTRACTION       MOHS SURGERY   2013, 2019    ON SCALP AND LEFT ARM    RIGHT/LEFT HEART CATH AND CORONARY ANGIOGRAPHY N/A 03/08/2020    Procedure: RIGHT/LEFT HEART CATH AND CORONARY ANGIOGRAPHY;  Surgeon: Burnell Blanks, MD;  Location: [facility];  Service: Cardiovascular;  Laterality: N/A;           Family History  Problem Relation Age of Onset   Cancer Mother          BONE, LUNG   Cancer Father          COLON, PROSTATE   Cancer - Lung Maternal Aunt        SOCIAL HISTORY: Social History         Socioeconomic History   Marital status: Married      Spouse name: Not on file   Number of children: Not on file   Years of education: Not on file   Highest  education level: Not on file  Occupational History   Not on file  Tobacco Use   Smoking status: Former      Packs/day: 2.00      Years: 45.00      Pack years: 90.00      Types: Cigarettes      Quit date: 02/2021      Years since quitting: 0.7   Smokeless tobacco: Never  Vaping Use   Vaping Use: Never used  Substance and Sexual Activity   Alcohol use: Not Currently   Drug use: Never   Sexual activity: Not on file  Other Topics Concern   Not on file  Social History Narrative   Not on file    Social Determinants of Health    Financial Resource Strain: Not on file  Food Insecurity: Not on file  Transportation Needs: Not on file  Physical Activity: Not on file  Stress: Not on file  Social Connections: Not on file  Intimate Partner Violence: Not on file      No Known Allergies         Current Outpatient Medications  Medication Sig Dispense Refill   acetaminophen (TYLENOL) 500 MG tablet Take 500 mg by mouth every 6 (six) hours as needed  for headache.        albuterol (PROVENTIL) (2.5 MG/3ML) 0.083% nebulizer solution Take 3 mLs (2.5 mg total) by nebulization every 4 (four) hours as needed for wheezing or shortness of breath. 75 mL 1   albuterol (VENTOLIN HFA) 108 (90 Base) MCG/ACT inhaler Inhale 1-2 puffs into the lungs every 6 (six) hours as needed for wheezing or shortness of breath. 18 g 3   Ascorbic Acid (VITAMIN C) 1000 MG tablet Take 1,000 mg by mouth daily.       aspirin EC 81 MG tablet Take 1 tablet (81 mg total) by mouth daily. 90 tablet 3   benzonatate (TESSALON) 200 MG capsule Take 1 capsule (200 mg total) by mouth 3 (three) times daily as needed for cough. 30 capsule 2   bisoprolol (ZEBETA) 10 MG tablet Take 1 tablet (10 mg total) by mouth daily. 30 tablet 1   BREZTRI AEROSPHERE 160-9-4.8 MCG/ACT AERO Take 2 puffs by mouth in the morning and at bedtime. 10.7 g 1   carvedilol (COREG) 6.25 MG tablet 1/2 tablet       cholecalciferol (VITAMIN D3) 25 MCG (1000 UNIT) tablet Take 1,000 Units by mouth daily.       ENTRESTO 97-103 MG TAKE 1 TABLET BY MOUTH 2 TIMES DAILY. 60 tablet 8   loratadine (CLARITIN) 10 MG tablet Take 1 tablet (10 mg total) by mouth daily. 30 tablet 1   Multiple Vitamins-Minerals (CENTRUM SILVER 50+MEN) TABS Take 1 tablet by mouth daily.       Omega-3 Fatty Acids (FISH OIL) 1000 MG CAPS Take 1,000 mg by mouth daily.        atorvastatin (LIPITOR) 40 MG tablet TAKE 1 TABLET BY MOUTH EVERY DAY (Patient not taking: Reported on 11/18/2021) 90 tablet 3    No current facility-administered medications for this visit.      REVIEW OF SYSTEMS:  '[X]'$  denotes positive finding, '[ ]'$  denotes negative finding Cardiac   Comments:  Chest pain or chest pressure:      Shortness of breath upon exertion:      Short of breath when lying flat:      Irregular heart rhythm:  Vascular      Pain in calf, thigh, or hip brought on by ambulation:      Pain in feet at night that wakes you up from your sleep:        Blood clot in your veins:      Leg swelling:              Pulmonary      Oxygen at home:      Productive cough:       Wheezing:              Neurologic      Sudden weakness in arms or legs:       Sudden numbness in arms or legs:       Sudden onset of difficulty speaking or slurred speech:      Temporary loss of vision in one eye:       Problems with dizziness:              Gastrointestinal      Blood in stool:       Vomited blood:              Genitourinary      Burning when urinating:       Blood in urine:             Psychiatric      Major depression:              Hematologic      Bleeding problems:      Problems with blood clotting too easily:             Skin      Rashes or ulcers:             Constitutional      Fever or chills:          PHYSICAL EXAM:    Vitals:    11/18/21 1434  BP: 134/87  Pulse: 81  Resp: 16  Temp: 97.7 F (36.5 C)  TempSrc: Temporal  SpO2: 92%  Weight: 200 lb (90.7 kg)  Height: '5\' 10"'$  (1.778 m)      GENERAL: The patient is a well-nourished male, in no acute distress. The vital signs are documented above. CARDIAC: There is a regular rate and rhythm.  VASCULAR:  Palpable femoral pulses bilaterally Palpable PT pulses bilaterally PULMONARY: No respiratory distress. ABDOMEN: Soft and non-tender. MUSCULOSKELETAL: There are no major deformities or cyanosis. NEUROLOGIC: No focal weakness or paresthesias are detected. SKIN: There are no ulcers or rashes noted. PSYCHIATRIC: The patient has a normal affect.   DATA:    CTA C/A/P reviewed from 10/31/21 and descending thoracic saccular aneurysm measuring 5.7 cm   Assessment/Plan:   71 year old male presents with a 5.7 cm aneurysm in the descending thoracic aorta.  This has a saccular morphology in the mid descending thoracic aorta and tend to be higher risk morphology.  I previously discussed recommendations are to repair these at 5.5 to 6 cm based on surgical risk of the patient.  I  think he would be a good candidate for stent graft repair and has adequate access.  I have recommended stent graft repair given that he has adequate access.  I discussed transfemoral access with stenting with a covered stent of his descending thoracic aorta.  We will get him scheduled today.  Risk benefits discussed including risk of anesthesia, MI, stroke, renal failure, access issues etc.     Gwenyth Allegra.  Carlis Abbott, MD [facility] Office: [phone]

## 2021-12-08 NOTE — Transfer of Care (Signed)
Immediate Anesthesia Transfer of Care Note ? ?Patient: Mark Tate ? ?Procedure(s) Performed: THORACIC AORTIC ENDOVASCULAR STENT GRAFT ? ?Patient Location: PACU ? ?Anesthesia Type:General ? ?Level of Consciousness: drowsy and patient cooperative ? ?Airway & Oxygen Therapy: Patient Spontanous Breathing ? ?Post-op Assessment: Report given to RN and Post -op Vital signs reviewed and stable ? ?Post vital signs: Reviewed and stable ? ?Last Vitals:  ?Vitals Value Taken Time  ?BP 129/79 12/08/21 0929  ?Temp    ?Pulse 74 12/08/21 0930  ?Resp 13 12/08/21 0930  ?SpO2 91 % 12/08/21 0930  ?Vitals shown include unvalidated device data. ? ?Last Pain:  ?Vitals:  ? 12/08/21 0650  ?TempSrc:   ?PainSc: 0-No pain  ?   ? ?  ? ?Complications: No notable events documented. ?

## 2021-12-08 NOTE — Anesthesia Procedure Notes (Signed)
Arterial Line Insertion ?Start/End3/13/2023 7:10 AM, 12/08/2021 7:20 AM ?Performed by: Lance Coon, CRNA, CRNA ? Preanesthetic checklist: patient identified, IV checked, site marked, risks and benefits discussed, surgical consent, monitors and equipment checked, pre-op evaluation, timeout performed and anesthesia consent ?Lidocaine 1% used for infiltration and patient sedated ?Left, radial was placed ?Catheter size: 20 G ?Hand hygiene performed , maximum sterile barriers used  and Seldinger technique used ? ?Attempts: 2 ?Procedure performed without using ultrasound guided technique. ?Following insertion, dressing applied and Biopatch. ?Post procedure assessment: normal and unchanged ? ?Patient tolerated the procedure well with no immediate complications. ? ? ?

## 2021-12-08 NOTE — Anesthesia Procedure Notes (Signed)
Procedure Name: Intubation ?Date/Time: 12/08/2021 7:55 AM ?Performed by: Lance Coon, CRNA ?Pre-anesthesia Checklist: Patient identified, Emergency Drugs available, Suction available, Patient being monitored and Timeout performed ?Patient Re-evaluated:Patient Re-evaluated prior to induction ?Oxygen Delivery Method: Circle system utilized ?Preoxygenation: Pre-oxygenation with 100% oxygen ?Induction Type: IV induction ?Ventilation: Mask ventilation without difficulty ?Laryngoscope Size: Sabra Heck and 3 ?Grade View: Grade I ?Tube type: Oral ?Tube size: 7.5 mm ?Number of attempts: 1 ?Airway Equipment and Method: Stylet ?Placement Confirmation: ETT inserted through vocal cords under direct vision, positive ETCO2 and breath sounds checked- equal and bilateral ?Secured at: 21 cm ?Tube secured with: Tape ?Dental Injury: Teeth and Oropharynx as per pre-operative assessment  ? ? ? ? ?

## 2021-12-09 ENCOUNTER — Encounter (HOSPITAL_COMMUNITY): Payer: Self-pay | Admitting: Vascular Surgery

## 2021-12-09 LAB — TYPE AND SCREEN
ABO/RH(D): O POS
Antibody Screen: NEGATIVE
Unit division: 0
Unit division: 0
Unit division: 0
Unit division: 0

## 2021-12-09 LAB — BPAM RBC
Blood Product Expiration Date: 202304052359
Blood Product Expiration Date: 202304052359
Blood Product Expiration Date: 202304052359
Blood Product Expiration Date: 202304052359
ISSUE DATE / TIME: 202303090854
ISSUE DATE / TIME: 202303090854
Unit Type and Rh: 5100
Unit Type and Rh: 5100
Unit Type and Rh: 5100
Unit Type and Rh: 5100

## 2021-12-09 LAB — BASIC METABOLIC PANEL
Anion gap: 8 (ref 5–15)
BUN: 15 mg/dL (ref 8–23)
CO2: 22 mmol/L (ref 22–32)
Calcium: 8.6 mg/dL — ABNORMAL LOW (ref 8.9–10.3)
Chloride: 106 mmol/L (ref 98–111)
Creatinine, Ser: 1.11 mg/dL (ref 0.61–1.24)
GFR, Estimated: >60 mL/min (ref >60)
Glucose, Bld: 140 mg/dL — ABNORMAL HIGH (ref 70–99)
Potassium: 4 mmol/L (ref 3.5–5.1)
Sodium: 136 mmol/L (ref 135–145)

## 2021-12-09 LAB — CBC
HCT: 41.8 % (ref 39.0–52.0)
Hemoglobin: 14.8 g/dL (ref 13.0–17.0)
MCH: 31.2 pg (ref 26.0–34.0)
MCHC: 35.4 g/dL (ref 30.0–36.0)
MCV: 88 fL (ref 80.0–100.0)
Platelets: 178 10*3/uL (ref 150–400)
RBC: 4.75 MIL/uL (ref 4.22–5.81)
RDW: 13.5 % (ref 11.5–15.5)
WBC: 14.9 10*3/uL — ABNORMAL HIGH (ref 4.0–10.5)
nRBC: 0 % (ref 0.0–0.2)

## 2021-12-09 MED ORDER — OXYCODONE-ACETAMINOPHEN 5-325 MG PO TABS: 1.0000 | ORAL_TABLET | Freq: Four times a day (QID) | ORAL | 0 refills | Status: DC | PRN

## 2021-12-09 NOTE — TOC Transition Note (Signed)
Transition of Care (TOC) - CM/SW Discharge Note ? ? ?Patient Details  ?Name: JOSEY FORCIER ?MRN: [id number] ?Date of Birth: November 14, 1950 ? ?Transition of Care (TOC) CM/SW Contact:  ?Carles Collet, RN ?Phone Number: ?12/09/2021, 10:00 AM ? ? ?Clinical Narrative:    ?Spoke w patient over the phone and he states that he has home oxygen through [employer] that he uses as needed and always at night, he was using it continuously in the past. He states that he still does his HEP exercises and walks up and down his road everyday/ most days. ?No current TOC needs identified for DC ? ? ? ?  ?  ? ? ?Patient Goals and CMS Choice ?  ?  ?  ? ?Discharge Placement ?  ?           ?  ?  ?  ?  ? ?Discharge Plan and Services ?  ?  ?           ?  ?  ?  ?  ?  ?  ?  ?  ?  ?  ? ?Social Determinants of Health (SDOH) Interventions ?  ? ? ?Readmission Risk Interventions ?No flowsheet data found. ? ? ? ? ?

## 2021-12-09 NOTE — Progress Notes (Addendum)
D/C tele and Ivs. Went over AVS with pt and his wife and all questions were addressed. ? ?Lavenia Atlas, RN ? ?

## 2021-12-09 NOTE — Discharge Instructions (Signed)
  [facility]   Discharge Instructions  Endovascular Aortic Aneurysm Repair  Please refer to the following instructions for your post-procedure care. Your surgeon or Physician Assistant will discuss any changes with you.  Activity  You are encouraged to walk as much as you can. You can slowly return to normal activities but must avoid strenuous activity and heavy lifting until your doctor tells you it's OK. Avoid activities such as vacuuming or swinging a gold club. It is normal to feel tired for several weeks after your surgery. Do not drive until your doctor gives the OK and you are no longer taking prescription pain medications. It is also normal to have difficulty with sleep habits, eating, and bowel movements after surgery. These will go away with time.  Bathing/Showering  Shower daily after you go home.  Do not soak in a bathtub, hot tub, or swim until the incision heals completely.  If you have incisions in your groin, wash the groin wounds with soap and water daily and pat dry. (No tub bath-only shower)  Then put a dry gauze or washcloth there to keep this area dry to help prevent wound infection daily and as needed.  Do not use Vaseline or neosporin on your incisions.  Only use soap and water on your incisions and then protect and keep dry.  Incision Care  Shower every day. Clean your incision with mild soap and water. Pat the area dry with a clean towel. You do not need a bandage unless otherwise instructed. Do not apply any ointments or creams to your incision. If you clothing is irritating, you may cover your incision with a dry gauze pad.  Diet  Resume your normal diet. There are no special food restrictions following this procedure. A low fat/low cholesterol diet is recommended for all patients with vascular disease. In order to heal from your surgery, it is CRITICAL to get adequate nutrition. Your body requires vitamins, minerals, and protein.  Vegetables are the best source of vitamins and minerals. Vegetables also provide the perfect balance of protein. Processed food has little nutritional value, so try to avoid this.  Medications  Resume taking all of your medications unless your doctor or nurse practitioner tells you not to. If your incision is causing pain, you may take over-the-counter pain relievers such as acetaminophen (Tylenol). If you were prescribed a stronger pain medication, please be aware these medications can cause nausea and constipation. Prevent nausea by taking the medication with a snack or meal. Avoid constipation by drinking plenty of fluids and eating foods with a high amount of fiber, such as fruits, vegetables, and grains.  Do not take Tylenol if you are taking prescription pain medications.   Follow up  Our office will schedule a follow-up appointment with a CT scan 3-4 weeks after your surgery.  Please call us immediately for any of the following conditions  Severe or worsening pain in your legs or feet or in your abdomen back or chest. Increased pain, redness, drainage (pus) from your incision site. Increased abdominal pain, bloating, nausea, vomiting or persistent diarrhea. Fever of 101 degrees or higher. Swelling in your leg (s),  Reduce your risk of vascular disease  Stop smoking. If you would like help call [facility] at 1-800-QUIT-NOW (1-800-784-8669) or [facility] at 336-586-4000. Manage your cholesterol Maintain a desired weight Control your diabetes Keep your blood pressure down  If you have questions, please call the office at [phone].  

## 2021-12-09 NOTE — Anesthesia Postprocedure Evaluation (Signed)
Anesthesia Post Note ? ?Patient: Mark Tate ? ?Procedure(s) Performed: THORACIC AORTIC ENDOVASCULAR STENT GRAFT ? ?  ? ?Patient location during evaluation: PACU ?Anesthesia Type: General ?Level of consciousness: sedated and patient cooperative ?Pain management: pain level controlled ?Vital Signs Assessment: post-procedure vital signs reviewed and stable ?Respiratory status: spontaneous breathing ?Cardiovascular status: stable ?Anesthetic complications: no ? ? ?No notable events documented. ? ?Last Vitals:  ?Vitals:  ? 12/09/21 0500 12/09/21 0810  ?BP: 130/70 133/78  ?Pulse: 80 86  ?Resp: 18 17  ?Temp: (!) 36.4 ?C 36.5 ?C  ?SpO2: 97% 97%  ?  ?Last Pain:  ?Vitals:  ? 12/09/21 0815  ?TempSrc:   ?PainSc: 0-No pain  ? ? ?  ?  ?  ?  ?  ?  ? ?Nolon Nations ? ? ? ? ?

## 2021-12-09 NOTE — Progress Notes (Signed)
Pt ambulated 250 ft in the hallway at RA. Pt tolerated well.  ? ?Lavenia Atlas, RN ? ?

## 2021-12-09 NOTE — Progress Notes (Addendum)
?  Progress Note ? ? ? ?12/09/2021 ?6:50 AM ?1 Day Post-Op ? ?Subjective:  pt doing well this morning.  Wants to go home.  Has not walked.  Has voided. Not really having any pain. ? ?Afebrile ?HR 18'H-631'S  ?970'Y systolic ?63% ? ?Vitals:  ? 12/09/21 0034 12/09/21 0500  ?BP: 132/87 130/70  ?Pulse: 81 80  ?Resp: (!) 24 18  ?Temp: 97.8 ?F (36.6 ?C) (!) 97.5 ?F (36.4 ?C)  ?SpO2: 95% 97%  ? ? ?Physical Exam: ?Cardiac:  regular ?Lungs:  non labored ?Incisions:  right groin is soft; mild ecchymosis  ?Extremities:  palpable DP pulses bilaterally ?Abdomen:  soft, NT ? ?CBC ?   ?Component Value Date/Time  ? WBC 14.9 (H) 12/09/2021 0107  ? RBC 4.75 12/09/2021 0107  ? HGB 14.8 12/09/2021 0107  ? HGB 16.0 02/27/2020 0852  ? HCT 41.8 12/09/2021 0107  ? HCT 46.1 02/27/2020 0852  ? PLT 178 12/09/2021 0107  ? PLT 205 02/27/2020 0852  ? MCV 88.0 12/09/2021 0107  ? MCV 90 02/27/2020 0852  ? MCH 31.2 12/09/2021 0107  ? MCHC 35.4 12/09/2021 0107  ? RDW 13.5 12/09/2021 0107  ? RDW 14.6 02/27/2020 0852  ? LYMPHSABS 1.8 03/04/2021 1025  ? LYMPHSABS 4.4 (H) 02/27/2020 7858  ? MONOABS 1.2 (H) 03/04/2021 1025  ? EOSABS 0.1 03/04/2021 1025  ? EOSABS 0.5 (H) 02/27/2020 8502  ? BASOSABS 0.0 03/04/2021 1025  ? BASOSABS 0.0 02/27/2020 0852  ? ? ?BMET ?   ?Component Value Date/Time  ? NA 136 12/09/2021 0107  ? NA 139 02/27/2020 0852  ? K 4.0 12/09/2021 0107  ? CL 106 12/09/2021 0107  ? CO2 22 12/09/2021 0107  ? GLUCOSE 140 (H) 12/09/2021 0107  ? BUN 15 12/09/2021 0107  ? BUN 14 02/27/2020 0852  ? CREATININE 1.11 12/09/2021 0107  ? CALCIUM 8.6 (L) 12/09/2021 0107  ? GFRNONAA >60 12/09/2021 0107  ? GFRAA 79 02/27/2020 0852  ? ? ?INR ?   ?Component Value Date/Time  ? INR 1.2 12/08/2021 0942  ? ? ? ?Intake/Output Summary (Last 24 hours) at 12/09/2021 0650 ?Last data filed at 12/09/2021 0426 ?Gross per 24 hour  ?Intake 2237.82 ml  ?Output 1920 ml  ?Net 317.82 ml  ? ? ? ?Assessment/Plan:  71 y.o. male is s/p:  ?TEVAR  ?1 Day Post-Op ? ? ?-pt with palpable  DP pulses bilaterally; right groin soft without hematoma.  ?-creatinine in normal range after receiving contrast ?-leukocytosis but most likely related to peri op period. ?-discharge home today and f/u with Dr. Carlis Abbott in 4 weeks with CTA protocol.  Needs to walk in hallways prior to dc.   ? ? ?Leontine Locket, PA-C ?[facility] ?[phone] ?12/09/2021 ?6:50 AM ? ?I have seen and evaluated the patient. I agree with the PA note as documented above.  Postop day 1 status post endovascular stent graft of descending thoracic aortic aneurysm.  Looks good this morning.  Neurologically intact.  Right groin clean dry and intact with no hematoma and palpable DP pulse in the right foot which was the access.  Discharge today and follow-up in 1 month with a CTA.  Discussed aspirin from my standpoint.  Labs reassuring. ? ?Marty Heck, MD ?[facility] ?Office: [phone] ? ? ?

## 2021-12-09 NOTE — Discharge Summary (Signed)
?EVAR Discharge Summary ? ? ?[name] ?[birth year] 71 y.o. male ? ?MRN: ?[id number] ? ?Admission Date: ?12/08/2021 ? ?Discharge Date: ?12/09/2021 ? ?Physician: ?Marty Heck, MD ? ?Admission Diagnosis: ?Thoracic aortic aneurysm [I71.20] ? ? ?HPI:   ?This is a 71 y.o. male with history of COPD that presents for follow-up after CTA to further evaluate descending thoracic aneurysm.  Patient was referred for thoracic aneurysm earlier this year and stated he had no knowledge of this aneurysm until recent CT chest this year for cancer screening.  In review of the notes it was identified on CT chest 05/23/2019 at which time it measured 4.3 cm.  Most recent CT on 10/22/2021 shows it measured 5.4 cm in maximal diameter.   ?  ?CTA chest was ordered to evaluate femoral/iliac access and options for stent graft repair.  He reports no changes today.  Still has quit smoking since Christmas.  No chest or back pain. ? ?Hospital Course:  ?The patient was admitted to the hospital and taken to the operating room on 12/08/2021 and underwent: ?1.  Ultrasound-guided access right common femoral artery for delivery of endograft with percutaneous closure ?2.  Arch aortogram and desending thoracic aortogram with interpretation ?3.  Treatment of descending thoracic aortic aneurysm with stent graft without coverage of the left subclavian artery (40 mm x 40 mm x 15 cm and 40 mm x 40 mm x 10 cm GORE TEVAR)   ? ?Findings: ?Transfemoral access of the right common femoral artery for delivery of endograft with percutaneous closure.  Initially deployed a 40 mm x 40 mm x 15 cm Gore thoracic endograft from the just above the celiac artery more proximal into the descending thoracic aorta in the aneurysm.  There was a type Ia endoleak.  We then had to place a second stent more proximally using a 40 mm x 40 mm x 10 cm with good overlap and had excellent seal of the descending thoracic aneurysm and no endoleak at completion. ? ?The pt tolerated the  procedure well and was transported to the PACU in good condition.  ? ?By POD 1, pt was doing well.  His foley was removed and he was able to void.  He ambulated and his pain was well controlled.  He had easily palpable pedal pulses bilaterally.  Renal function WNL.   ? ? ?CBC ?   ?Component Value Date/Time  ? WBC 14.9 (H) 12/09/2021 0107  ? RBC 4.75 12/09/2021 0107  ? HGB 14.8 12/09/2021 0107  ? HGB 16.0 02/27/2020 0852  ? HCT 41.8 12/09/2021 0107  ? HCT 46.1 02/27/2020 0852  ? PLT 178 12/09/2021 0107  ? PLT 205 02/27/2020 0852  ? MCV 88.0 12/09/2021 0107  ? MCV 90 02/27/2020 0852  ? MCH 31.2 12/09/2021 0107  ? MCHC 35.4 12/09/2021 0107  ? RDW 13.5 12/09/2021 0107  ? RDW 14.6 02/27/2020 0852  ? LYMPHSABS 1.8 03/04/2021 1025  ? LYMPHSABS 4.4 (H) 02/27/2020 6384  ? MONOABS 1.2 (H) 03/04/2021 1025  ? EOSABS 0.1 03/04/2021 1025  ? EOSABS 0.5 (H) 02/27/2020 6659  ? BASOSABS 0.0 03/04/2021 1025  ? BASOSABS 0.0 02/27/2020 0852  ? ? ?BMET ?   ?Component Value Date/Time  ? NA 136 12/09/2021 0107  ? NA 139 02/27/2020 0852  ? K 4.0 12/09/2021 0107  ? CL 106 12/09/2021 0107  ? CO2 22 12/09/2021 0107  ? GLUCOSE 140 (H) 12/09/2021 0107  ? BUN 15 12/09/2021 0107  ? BUN 14 02/27/2020 0852  ?  CREATININE 1.11 12/09/2021 0107  ? CALCIUM 8.6 (L) 12/09/2021 0107  ? GFRNONAA >60 12/09/2021 0107  ? GFRAA 79 02/27/2020 0852  ? ? ? ? ? ?Discharge Instructions   ? ? Discharge patient   Complete by: As directed ?  ? Discharge home after Dr. Carlis Abbott has seen pt and he has walked in the hallways.  Thanks  ? Discharge disposition: 01-Home or Self Care  ? Discharge patient date: 12/09/2021  ? ?  ? ? ?Discharge Diagnosis:  ?Thoracic aortic aneurysm [I71.20] ? ?Secondary Diagnosis: ?Patient Active Problem List  ? Diagnosis Date Noted  ? Thoracic aortic aneurysm 12/08/2021  ? Aneurysm of thoracic aorta 10/28/2021  ? Chronic respiratory failure with hypoxia ([place]) 04/23/2021  ? COPD exacerbation ([place]) 03/04/2021  ? Abnormal stress test   ? CAD in native  artery 01/03/2020  ? Dilated cardiomyopathy ([place]) 01/03/2020  ? COPD (chronic obstructive pulmonary disease) ([place]) 01/03/2020  ? Smoker within last 12 months 01/03/2020  ? ?Past Medical History:  ?Diagnosis Date  ? Allergies   ? Asthma   ? CAD in native artery 01/03/2020  ? COPD (chronic obstructive pulmonary disease) ([place])   ? COPD (chronic obstructive pulmonary disease) ([place]) 01/03/2020  ? Coughing   ? Fatigue   ? Neck swelling   ? Skin cancer   ? SOB (shortness of breath) on exertion   ? Tobacco use 01/03/2020  ? Wheezing   ? ? ? ?Allergies as of 12/09/2021   ?No Known Allergies ?  ? ?  ?Medication List  ?  ? ?TAKE these medications   ? ?acetaminophen 500 MG tablet ?Commonly known as: TYLENOL ?Take 500 mg by mouth every 6 (six) hours as needed for headache. ?  ?albuterol (2.5 MG/3ML) 0.083% nebulizer solution ?Commonly known as: PROVENTIL ?Take 3 mLs (2.5 mg total) by nebulization every 4 (four) hours as needed for wheezing or shortness of breath. ?What changed: when to take this ?  ?albuterol 108 (90 Base) MCG/ACT inhaler ?Commonly known as: VENTOLIN HFA ?Inhale 1-2 puffs into the lungs every 6 (six) hours as needed for wheezing or shortness of breath. ?What changed: Another medication with the same name was changed. Make sure you understand how and when to take each. ?  ?aspirin EC 81 MG tablet ?Take 1 tablet (81 mg total) by mouth daily. ?  ?atorvastatin 40 MG tablet ?Commonly known as: LIPITOR ?TAKE 1 TABLET BY MOUTH EVERY DAY ?  ?benzonatate 200 MG capsule ?Commonly known as: TESSALON ?Take 1 capsule (200 mg total) by mouth 3 (three) times daily as needed for cough. ?  ?Breztri Aerosphere 160-9-4.8 MCG/ACT Aero ?Generic drug: Budeson-Glycopyrrol-Formoterol ?Take 2 puffs by mouth in the morning and at bedtime. ?  ?carvedilol 6.25 MG tablet ?Commonly known as: COREG ?Take 3.125 mg by mouth 2 (two) times daily with a meal. ?  ?Centrum Silver 50+Men Tabs ?Take 1 tablet by mouth daily. ?  ?cetirizine 10 MG  tablet ?Commonly known as: ZYRTEC ?Take 10 mg by mouth daily. ?  ?cholecalciferol 25 MCG (1000 UNIT) tablet ?Commonly known as: VITAMIN D3 ?Take 1,000 Units by mouth daily. ?  ?Entresto 97-103 MG ?Generic drug: sacubitril-valsartan ?TAKE 1 TABLET BY MOUTH 2 TIMES DAILY. ?  ?oxyCODONE-acetaminophen 5-325 MG tablet ?Commonly known as: Percocet ?Take 1 tablet by mouth every 6 (six) hours as needed for severe pain. ?  ?vitamin C 1000 MG tablet ?Take 1,000 mg by mouth daily. ?  ? ?  ? ? ?Discharge Instructions: ? ?[facility]  ?Discharge Instructions Endovascular Aortic Aneurysm Repair ? ?Please refer to the following instructions for your post-procedure care. Your surgeon or Physician Assistant will discuss any changes with you. ? ?Activity ? ?You are encouraged to walk as much as you can. You can slowly return to normal activities but must avoid strenuous activity and heavy lifting until your doctor tells you it's OK. Avoid activities such as vacuuming or swinging a gold club. It is normal to feel tired for several weeks after your surgery. Do not drive until your doctor gives the OK and you are no longer taking prescription pain medications. It is also normal to have difficulty with sleep habits, eating, and bowel movements after surgery. These will go away with time. ? ?Bathing/Showering ? ?You may shower after you go home. If you have an incision, do not soak in a bathtub, hot tub, or swim until the incision heals completely. ? ?Incision Care ? ?Shower every day. Clean your incision with mild soap and water. Pat the area dry with a clean towel. You do not need a bandage unless otherwise instructed. Do not apply any ointments or creams to your incision. If you clothing is irritating, you may cover your incision with a dry gauze pad. ? ?Diet ? ?Resume your normal diet. There are no special food restrictions following this procedure. A low fat/low cholesterol diet is recommended for all  patients with vascular disease. In order to heal from your surgery, it is CRITICAL to get adequate nutrition. Your body requires vitamins, minerals, and protein. Vegetables are the best source of vitamins and m

## 2021-12-10 ENCOUNTER — Encounter: Payer: Self-pay | Admitting: Vascular Surgery

## 2021-12-10 DIAGNOSIS — I2510 Atherosclerotic heart disease of native coronary artery without angina pectoris: Secondary | ICD-10-CM | POA: Diagnosis not present

## 2021-12-10 DIAGNOSIS — M6281 Muscle weakness (generalized): Secondary | ICD-10-CM | POA: Diagnosis not present

## 2021-12-10 DIAGNOSIS — J449 Chronic obstructive pulmonary disease, unspecified: Secondary | ICD-10-CM | POA: Diagnosis not present

## 2021-12-10 DIAGNOSIS — Z48812 Encounter for surgical aftercare following surgery on the circulatory system: Secondary | ICD-10-CM | POA: Diagnosis not present

## 2021-12-10 DIAGNOSIS — Z87891 Personal history of nicotine dependence: Secondary | ICD-10-CM | POA: Diagnosis not present

## 2021-12-12 DIAGNOSIS — I2510 Atherosclerotic heart disease of native coronary artery without angina pectoris: Secondary | ICD-10-CM | POA: Diagnosis not present

## 2021-12-12 DIAGNOSIS — Z48812 Encounter for surgical aftercare following surgery on the circulatory system: Secondary | ICD-10-CM | POA: Diagnosis not present

## 2021-12-12 DIAGNOSIS — J449 Chronic obstructive pulmonary disease, unspecified: Secondary | ICD-10-CM | POA: Diagnosis not present

## 2021-12-12 DIAGNOSIS — Z87891 Personal history of nicotine dependence: Secondary | ICD-10-CM | POA: Diagnosis not present

## 2021-12-12 DIAGNOSIS — M6281 Muscle weakness (generalized): Secondary | ICD-10-CM | POA: Diagnosis not present

## 2021-12-12 DIAGNOSIS — R531 Weakness: Secondary | ICD-10-CM | POA: Diagnosis not present

## 2021-12-15 DIAGNOSIS — M6281 Muscle weakness (generalized): Secondary | ICD-10-CM | POA: Diagnosis not present

## 2021-12-15 DIAGNOSIS — I2510 Atherosclerotic heart disease of native coronary artery without angina pectoris: Secondary | ICD-10-CM | POA: Diagnosis not present

## 2021-12-15 DIAGNOSIS — Z87891 Personal history of nicotine dependence: Secondary | ICD-10-CM | POA: Diagnosis not present

## 2021-12-15 DIAGNOSIS — Z48812 Encounter for surgical aftercare following surgery on the circulatory system: Secondary | ICD-10-CM | POA: Diagnosis not present

## 2021-12-15 DIAGNOSIS — J449 Chronic obstructive pulmonary disease, unspecified: Secondary | ICD-10-CM | POA: Diagnosis not present

## 2021-12-17 DIAGNOSIS — I2510 Atherosclerotic heart disease of native coronary artery without angina pectoris: Secondary | ICD-10-CM | POA: Diagnosis not present

## 2021-12-17 DIAGNOSIS — J449 Chronic obstructive pulmonary disease, unspecified: Secondary | ICD-10-CM | POA: Diagnosis not present

## 2021-12-17 DIAGNOSIS — Z48812 Encounter for surgical aftercare following surgery on the circulatory system: Secondary | ICD-10-CM | POA: Diagnosis not present

## 2021-12-17 DIAGNOSIS — Z87891 Personal history of nicotine dependence: Secondary | ICD-10-CM | POA: Diagnosis not present

## 2021-12-17 DIAGNOSIS — M6281 Muscle weakness (generalized): Secondary | ICD-10-CM | POA: Diagnosis not present

## 2021-12-19 DIAGNOSIS — J449 Chronic obstructive pulmonary disease, unspecified: Secondary | ICD-10-CM | POA: Diagnosis not present

## 2021-12-19 DIAGNOSIS — Z48812 Encounter for surgical aftercare following surgery on the circulatory system: Secondary | ICD-10-CM | POA: Diagnosis not present

## 2021-12-19 DIAGNOSIS — M6281 Muscle weakness (generalized): Secondary | ICD-10-CM | POA: Diagnosis not present

## 2021-12-19 DIAGNOSIS — I2510 Atherosclerotic heart disease of native coronary artery without angina pectoris: Secondary | ICD-10-CM | POA: Diagnosis not present

## 2021-12-19 DIAGNOSIS — Z87891 Personal history of nicotine dependence: Secondary | ICD-10-CM | POA: Diagnosis not present

## 2021-12-20 DIAGNOSIS — Z87891 Personal history of nicotine dependence: Secondary | ICD-10-CM | POA: Diagnosis not present

## 2021-12-20 DIAGNOSIS — I2510 Atherosclerotic heart disease of native coronary artery without angina pectoris: Secondary | ICD-10-CM | POA: Diagnosis not present

## 2021-12-20 DIAGNOSIS — J449 Chronic obstructive pulmonary disease, unspecified: Secondary | ICD-10-CM | POA: Diagnosis not present

## 2021-12-20 DIAGNOSIS — Z48812 Encounter for surgical aftercare following surgery on the circulatory system: Secondary | ICD-10-CM | POA: Diagnosis not present

## 2021-12-20 DIAGNOSIS — M6281 Muscle weakness (generalized): Secondary | ICD-10-CM | POA: Diagnosis not present

## 2021-12-23 DIAGNOSIS — J449 Chronic obstructive pulmonary disease, unspecified: Secondary | ICD-10-CM | POA: Diagnosis not present

## 2021-12-23 DIAGNOSIS — Z87891 Personal history of nicotine dependence: Secondary | ICD-10-CM | POA: Diagnosis not present

## 2021-12-23 DIAGNOSIS — Z48812 Encounter for surgical aftercare following surgery on the circulatory system: Secondary | ICD-10-CM | POA: Diagnosis not present

## 2021-12-23 DIAGNOSIS — I2510 Atherosclerotic heart disease of native coronary artery without angina pectoris: Secondary | ICD-10-CM | POA: Diagnosis not present

## 2021-12-23 DIAGNOSIS — M6281 Muscle weakness (generalized): Secondary | ICD-10-CM | POA: Diagnosis not present

## 2021-12-24 DIAGNOSIS — I2510 Atherosclerotic heart disease of native coronary artery without angina pectoris: Secondary | ICD-10-CM | POA: Diagnosis not present

## 2021-12-24 DIAGNOSIS — M6281 Muscle weakness (generalized): Secondary | ICD-10-CM | POA: Diagnosis not present

## 2021-12-24 DIAGNOSIS — Z48812 Encounter for surgical aftercare following surgery on the circulatory system: Secondary | ICD-10-CM | POA: Diagnosis not present

## 2021-12-24 DIAGNOSIS — Z87891 Personal history of nicotine dependence: Secondary | ICD-10-CM | POA: Diagnosis not present

## 2021-12-24 DIAGNOSIS — J449 Chronic obstructive pulmonary disease, unspecified: Secondary | ICD-10-CM | POA: Diagnosis not present

## 2021-12-30 DIAGNOSIS — I428 Other cardiomyopathies: Secondary | ICD-10-CM | POA: Diagnosis not present

## 2021-12-30 DIAGNOSIS — J449 Chronic obstructive pulmonary disease, unspecified: Secondary | ICD-10-CM | POA: Diagnosis not present

## 2021-12-31 DIAGNOSIS — J449 Chronic obstructive pulmonary disease, unspecified: Secondary | ICD-10-CM | POA: Diagnosis not present

## 2021-12-31 DIAGNOSIS — I2510 Atherosclerotic heart disease of native coronary artery without angina pectoris: Secondary | ICD-10-CM | POA: Diagnosis not present

## 2021-12-31 DIAGNOSIS — Z87891 Personal history of nicotine dependence: Secondary | ICD-10-CM | POA: Diagnosis not present

## 2021-12-31 DIAGNOSIS — Z48812 Encounter for surgical aftercare following surgery on the circulatory system: Secondary | ICD-10-CM | POA: Diagnosis not present

## 2021-12-31 DIAGNOSIS — M6281 Muscle weakness (generalized): Secondary | ICD-10-CM | POA: Diagnosis not present

## 2022-01-12 DIAGNOSIS — J449 Chronic obstructive pulmonary disease, unspecified: Secondary | ICD-10-CM | POA: Diagnosis not present

## 2022-01-12 DIAGNOSIS — R531 Weakness: Secondary | ICD-10-CM | POA: Diagnosis not present

## 2022-01-13 ENCOUNTER — Encounter: Payer: Medicare Other | Admitting: Vascular Surgery

## 2022-01-15 ENCOUNTER — Other Ambulatory Visit: Payer: Self-pay

## 2022-01-15 DIAGNOSIS — I7123 Aneurysm of the descending thoracic aorta, without rupture: Secondary | ICD-10-CM

## 2022-01-22 ENCOUNTER — Ambulatory Visit (HOSPITAL_COMMUNITY)
Admission: RE | Admit: 2022-01-22 | Discharge: 2022-01-22 | Disposition: A | Discharge status: Home / Self Care | Payer: Medicare Other | Source: Ambulatory Visit | Attending: Vascular Surgery | Admitting: Vascular Surgery

## 2022-01-22 DIAGNOSIS — Z9889 Other specified postprocedural states: Secondary | ICD-10-CM | POA: Diagnosis not present

## 2022-01-22 DIAGNOSIS — J439 Emphysema, unspecified: Secondary | ICD-10-CM | POA: Diagnosis not present

## 2022-01-22 DIAGNOSIS — K5730 Diverticulosis of large intestine without perforation or abscess without bleeding: Secondary | ICD-10-CM | POA: Diagnosis not present

## 2022-01-22 DIAGNOSIS — K8020 Calculus of gallbladder without cholecystitis without obstruction: Secondary | ICD-10-CM | POA: Diagnosis not present

## 2022-01-22 DIAGNOSIS — I7123 Aneurysm of the descending thoracic aorta, without rupture: Secondary | ICD-10-CM | POA: Insufficient documentation

## 2022-01-22 DIAGNOSIS — I2510 Atherosclerotic heart disease of native coronary artery without angina pectoris: Secondary | ICD-10-CM | POA: Diagnosis not present

## 2022-01-22 DIAGNOSIS — I712 Thoracic aortic aneurysm, without rupture, unspecified: Secondary | ICD-10-CM | POA: Diagnosis not present

## 2022-01-22 MED ORDER — IOHEXOL 350 MG/ML SOLN
100.0000 mL | Freq: Once | INTRAVENOUS | Status: AC | PRN
  Administered 2022-01-22: 100 mL via INTRAVENOUS

## 2022-01-23 DIAGNOSIS — L570 Actinic keratosis: Secondary | ICD-10-CM | POA: Diagnosis not present

## 2022-01-23 DIAGNOSIS — D485 Neoplasm of uncertain behavior of skin: Secondary | ICD-10-CM | POA: Diagnosis not present

## 2022-01-23 DIAGNOSIS — D044 Carcinoma in situ of skin of scalp and neck: Secondary | ICD-10-CM | POA: Diagnosis not present

## 2022-01-27 ENCOUNTER — Ambulatory Visit (INDEPENDENT_AMBULATORY_CARE_PROVIDER_SITE_OTHER): Payer: Medicare Other | Admitting: Vascular Surgery

## 2022-01-27 ENCOUNTER — Encounter: Payer: Self-pay | Admitting: Vascular Surgery

## 2022-01-27 VITALS — BP 138/90 | HR 80 | Temp 97.9°F | Resp 18 | Ht 71.0 in | Wt 202.0 lb

## 2022-01-27 DIAGNOSIS — I7123 Aneurysm of the descending thoracic aorta, without rupture: Secondary | ICD-10-CM

## 2022-01-27 NOTE — Progress Notes (Signed)
? ?Patient name: Mark Tate MRN: [id number] DOB: November 16, 1950 Sex: male ? ?REASON FOR VISIT: Postop check after TEVAR ? ?HPI: ?Mark Tate is a 71 y.o. male that presents for postop check after recent TEVAR for 5.8 cm descending thoracic aneurysm.  He underwent TEVAR on 12/08/2021 with no issues and was discharged without complication.  We placed two GORE stents.  Today he has no complaints.  No chest pain or abdominal pain.  Groin incision sites are healed.  He does have a AAA that we are following as well. ? ?Past Medical History:  ?Diagnosis Date  ? Allergies   ? Asthma   ? CAD in native artery 01/03/2020  ? COPD (chronic obstructive pulmonary disease) ([place])   ? COPD (chronic obstructive pulmonary disease) ([place]) 01/03/2020  ? Coughing   ? Fatigue   ? Neck swelling   ? Skin cancer   ? SOB (shortness of breath) on exertion   ? Tobacco use 01/03/2020  ? Wheezing   ? ? ?Past Surgical History:  ?Procedure Laterality Date  ? BASAL CELL CARCINOMA EXCISION    ? NECK  ? basal cell carcinoma excision Right   ? Right Ear  ? CATARACT EXTRACTION    ? MOHS SURGERY  2013, 2019  ? ON SCALP AND LEFT ARM   ? RIGHT/LEFT HEART CATH AND CORONARY ANGIOGRAPHY N/A 03/08/2020  ? Procedure: RIGHT/LEFT HEART CATH AND CORONARY ANGIOGRAPHY;  Surgeon: Burnell Blanks, MD;  Location: [facility];  Service: Cardiovascular;  Laterality: N/A;  ? THORACIC AORTIC ENDOVASCULAR STENT GRAFT N/A 12/08/2021  ? Procedure: THORACIC AORTIC ENDOVASCULAR STENT GRAFT;  Surgeon: Marty Heck, MD;  Location: [facility];  Service: Vascular;  Laterality: N/A;  ? ? ?Family History  ?Problem Relation Age of Onset  ? Cancer Mother   ?     BONE, LUNG  ? Cancer Father   ?     COLON, PROSTATE  ? Cancer - Lung Maternal Aunt   ? ? ?SOCIAL HISTORY: ?Social History  ? ?Tobacco Use  ? Smoking status: Former  ?  Packs/day: 2.00  ?  Years: 45.00  ?  Pack years: 90.00  ?  Types: Cigarettes  ?  Quit date: 02/2021  ?  Years since quitting: 0.9  ? Smokeless  tobacco: Never  ?Substance Use Topics  ? Alcohol use: Not Currently  ? ? ?No Known Allergies ? ?Current Outpatient Medications  ?Medication Sig Dispense Refill  ? acetaminophen (TYLENOL) 500 MG tablet Take 500 mg by mouth every 6 (six) hours as needed for headache.     ? albuterol (PROVENTIL) (2.5 MG/3ML) 0.083% nebulizer solution Take 3 mLs (2.5 mg total) by nebulization every 4 (four) hours as needed for wheezing or shortness of breath. (Patient taking differently: Take 2.5 mg by nebulization in the morning and at bedtime.) 75 mL 1  ? albuterol (VENTOLIN HFA) 108 (90 Base) MCG/ACT inhaler Inhale 1-2 puffs into the lungs every 6 (six) hours as needed for wheezing or shortness of breath. 18 g 3  ? Ascorbic Acid (VITAMIN C) 1000 MG tablet Take 1,000 mg by mouth daily.    ? aspirin EC 81 MG tablet Take 1 tablet (81 mg total) by mouth daily. 90 tablet 3  ? atorvastatin (LIPITOR) 40 MG tablet TAKE 1 TABLET BY MOUTH EVERY DAY 90 tablet 3  ? BREZTRI AEROSPHERE 160-9-4.8 MCG/ACT AERO Take 2 puffs by mouth in the morning and at bedtime. 10.7 g 1  ? carvedilol (COREG) 6.25  MG tablet Take 3.125 mg by mouth 2 (two) times daily with a meal.    ? cetirizine (ZYRTEC) 10 MG tablet Take 10 mg by mouth daily.    ? cholecalciferol (VITAMIN D3) 25 MCG (1000 UNIT) tablet Take 1,000 Units by mouth daily.    ? ENTRESTO 97-103 MG TAKE 1 TABLET BY MOUTH 2 TIMES DAILY. 60 tablet 8  ? Multiple Vitamins-Minerals (CENTRUM SILVER 50+MEN) TABS Take 1 tablet by mouth daily.    ? benzonatate (TESSALON) 200 MG capsule Take 1 capsule (200 mg total) by mouth 3 (three) times daily as needed for cough. (Patient not taking: Reported on 11/28/2021) 30 capsule 2  ? oxyCODONE-acetaminophen (PERCOCET) 5-325 MG tablet Take 1 tablet by mouth every 6 (six) hours as needed for severe pain. (Patient not taking: Reported on 01/27/2022) 8 tablet 0  ? ?No current facility-administered medications for this visit.  ? ? ?REVIEW OF SYSTEMS:  ?'[X]'$  denotes positive finding,  '[ ]'$  denotes negative finding ?Cardiac  Comments:  ?Chest pain or chest pressure:    ?Shortness of breath upon exertion:    ?Short of breath when lying flat:    ?Irregular heart rhythm:    ?    ?Vascular    ?Pain in calf, thigh, or hip brought on by ambulation:    ?Pain in feet at night that wakes you up from your sleep:     ?Blood clot in your veins:    ?Leg swelling:     ?    ?Pulmonary    ?Oxygen at home:    ?Productive cough:     ?Wheezing:     ?    ?Neurologic    ?Sudden weakness in arms or legs:     ?Sudden numbness in arms or legs:     ?Sudden onset of difficulty speaking or slurred speech:    ?Temporary loss of vision in one eye:     ?Problems with dizziness:     ?    ?Gastrointestinal    ?Blood in stool:     ?Vomited blood:     ?    ?Genitourinary    ?Burning when urinating:     ?Blood in urine:    ?    ?Psychiatric    ?Major depression:     ?    ?Hematologic    ?Bleeding problems:    ?Problems with blood clotting too easily:    ?    ?Skin    ?Rashes or ulcers:    ?    ?Constitutional    ?Fever or chills:    ? ? ?PHYSICAL EXAM: ?Vitals:  ? 01/27/22 1101 01/27/22 1102  ?BP: (!) 143/93 138/90  ?Pulse: 80 80  ?Resp: 18   ?Temp: 97.9 ?F (36.6 ?C)   ?TempSrc: Temporal   ?SpO2: 94%   ?Weight: 202 lb (91.6 kg)   ?Height: '5\' 11"'$  (1.803 m)   ? ? ?GENERAL: The patient is a well-nourished male, in no acute distress. The vital signs are documented above. ?CARDIAC: There is a regular rate and rhythm.  ?VASCULAR:  ?Bilateral femoral pulses palpable ?Bilateral DP pulses palpable ?PULMONARY: No respiratory distress. ?ABDOMEN: Soft and non-tender. ?MUSCULOSKELETAL: There are no major deformities or cyanosis. ?NEUROLOGIC: No focal weakness or paresthesias are detected. ?SKIN: There are no ulcers or rashes noted. ?PSYCHIATRIC: The patient has a normal affect. ? ?DATA:  ? ?CTA chest abdomen pelvis reviewed from 01/22/2022 with stent graft in the descending thoracic aorta with no evidence of endoleak and exclusion  of the native  aneurysm sac.  There is evidence of a 3.5 cm abdominal aortic aneurysm. ? ?Assessment/Plan: ? ?71 year old male status post TEVAR for a 5.8 cm descending thoracic aortic aneurysm on 12/08/2021.  I reviewed his CTA on 01/22/2022 and the stent grafts are in excellent position with no endoleak.  Discussed he does have a small 3.5 cm abdominal aortic aneurysm that we will continue surveillance at this time.  I will see him in 9 months with CTA chest abdomen pelvis.  Discussed he call with questions or concerns. ? ? ?Marty Heck, MD ?[facility] ?Office: [phone] ? ? ? ? ? ?

## 2022-02-11 DIAGNOSIS — R531 Weakness: Secondary | ICD-10-CM | POA: Diagnosis not present

## 2022-02-11 DIAGNOSIS — M25512 Pain in left shoulder: Secondary | ICD-10-CM | POA: Diagnosis not present

## 2022-02-11 DIAGNOSIS — J449 Chronic obstructive pulmonary disease, unspecified: Secondary | ICD-10-CM | POA: Diagnosis not present

## 2022-02-13 ENCOUNTER — Telehealth: Payer: Self-pay | Admitting: Cardiovascular Disease

## 2022-02-13 NOTE — Telephone Encounter (Signed)
Spoke with pt who states he has had hiccups x 1 week.  Pt is unsure of previous medication given by PCP.  Pt advised to contact his PCP re: medication for hiccups.  Pt verbalizes understanding and agrees with current plan.

## 2022-02-13 NOTE — Telephone Encounter (Signed)
   Pt is requesting if Dr. Johnsie Cancel can send him a prescription for hiccups

## 2022-02-24 DIAGNOSIS — D044 Carcinoma in situ of skin of scalp and neck: Secondary | ICD-10-CM | POA: Diagnosis not present

## 2022-02-26 ENCOUNTER — Other Ambulatory Visit: Payer: Self-pay | Admitting: Cardiovascular Disease

## 2022-03-02 DIAGNOSIS — H1032 Unspecified acute conjunctivitis, left eye: Secondary | ICD-10-CM | POA: Diagnosis not present

## 2022-03-18 DIAGNOSIS — Z1389 Encounter for screening for other disorder: Secondary | ICD-10-CM | POA: Diagnosis not present

## 2022-03-18 DIAGNOSIS — M25512 Pain in left shoulder: Secondary | ICD-10-CM | POA: Diagnosis not present

## 2022-03-18 DIAGNOSIS — I428 Other cardiomyopathies: Secondary | ICD-10-CM | POA: Diagnosis not present

## 2022-03-18 DIAGNOSIS — I2510 Atherosclerotic heart disease of native coronary artery without angina pectoris: Secondary | ICD-10-CM | POA: Diagnosis not present

## 2022-03-18 DIAGNOSIS — Z0000 Encounter for general adult medical examination without abnormal findings: Secondary | ICD-10-CM | POA: Diagnosis not present

## 2022-03-18 DIAGNOSIS — Z1211 Encounter for screening for malignant neoplasm of colon: Secondary | ICD-10-CM | POA: Diagnosis not present

## 2022-03-18 DIAGNOSIS — E7800 Pure hypercholesterolemia, unspecified: Secondary | ICD-10-CM | POA: Diagnosis not present

## 2022-03-18 DIAGNOSIS — J309 Allergic rhinitis, unspecified: Secondary | ICD-10-CM | POA: Diagnosis not present

## 2022-03-18 DIAGNOSIS — J449 Chronic obstructive pulmonary disease, unspecified: Secondary | ICD-10-CM | POA: Diagnosis not present

## 2022-03-18 DIAGNOSIS — I700 Atherosclerosis of aorta: Secondary | ICD-10-CM | POA: Diagnosis not present

## 2022-03-19 ENCOUNTER — Other Ambulatory Visit: Payer: Self-pay | Admitting: Orthopedic Surgery

## 2022-03-19 DIAGNOSIS — M25512 Pain in left shoulder: Secondary | ICD-10-CM

## 2022-03-20 ENCOUNTER — Telehealth: Payer: Self-pay | Admitting: Internal Medicine

## 2022-03-27 NOTE — Telephone Encounter (Signed)
Called and spoke with pt about his O2. Pt stated that DME had already come and picked up the equipment. Stated to pt that we needed to get him in for a f/u as he was due for an appt. Appt scheduled for pt. Nothing further needed.

## 2022-04-03 ENCOUNTER — Encounter: Payer: Self-pay | Admitting: Internal Medicine

## 2022-04-03 ENCOUNTER — Ambulatory Visit
Admission: RE | Admit: 2022-04-03 | Discharge: 2022-04-03 | Disposition: A | Discharge status: Home / Self Care | Payer: Medicare Other | Source: Ambulatory Visit | Attending: Orthopedic Surgery | Admitting: Orthopedic Surgery

## 2022-04-03 ENCOUNTER — Ambulatory Visit: Payer: Medicare Other | Admitting: Internal Medicine

## 2022-04-03 VITALS — BP 118/74 | HR 65 | Temp 97.9°F | Ht 71.0 in | Wt 198.2 lb

## 2022-04-03 DIAGNOSIS — S46012A Strain of muscle(s) and tendon(s) of the rotator cuff of left shoulder, initial encounter: Secondary | ICD-10-CM | POA: Diagnosis not present

## 2022-04-03 DIAGNOSIS — J449 Chronic obstructive pulmonary disease, unspecified: Secondary | ICD-10-CM | POA: Diagnosis not present

## 2022-04-03 DIAGNOSIS — R918 Other nonspecific abnormal finding of lung field: Secondary | ICD-10-CM

## 2022-04-03 DIAGNOSIS — S43432A Superior glenoid labrum lesion of left shoulder, initial encounter: Secondary | ICD-10-CM | POA: Diagnosis not present

## 2022-04-03 DIAGNOSIS — M25512 Pain in left shoulder: Secondary | ICD-10-CM

## 2022-04-03 DIAGNOSIS — S46112A Strain of muscle, fascia and tendon of long head of biceps, left arm, initial encounter: Secondary | ICD-10-CM | POA: Diagnosis not present

## 2022-04-03 DIAGNOSIS — M19012 Primary osteoarthritis, left shoulder: Secondary | ICD-10-CM | POA: Diagnosis not present

## 2022-04-03 DIAGNOSIS — Z87891 Personal history of nicotine dependence: Secondary | ICD-10-CM

## 2022-04-03 MED ORDER — SPIRIVA RESPIMAT 2.5 MCG/ACT IN AERS: 2.0000 | INHALATION_SPRAY | Freq: Every day | RESPIRATORY_TRACT | 0 refills | Status: AC

## 2022-04-03 NOTE — Progress Notes (Signed)
$'@Patient'J$  ID: Mark Tate, male    DOB: [birth year], 71 y.o.   MRN: [id number]  Chief Complaint  Patient presents with   Follow-up    PFT review.    HPI  IOV 02/24/2018  Chief Complaint  Patient presents with   Consult    Referred by [name] due to breathing issues. Pt states he mainly becomes SOB with exertion. Denies any complaints of cough or CP.   71 year old male smoker 45 pack.  Referred by [name] who is a partner Dr. Marlou Sa [facility] practice.  Patient's brother is a Engineer, drilling in [place], [place].  He tells me that his mom died recently and the brother spent some time with him and noticed he was short of breath with exertion.  Also had chronic cough and fatigue.  Symptoms have been going on for a few years unchanged stable insidious onset no associated chest pain or radiation.  There is significant associated wheezing.  Therefore he went and saw her primary care physician's office [name].  Was started on Symbicort may be over a week ago along with prednisone and antibiotics.  He says is given some relief.  He has been taking Symbicort twice daily.  COPD CAT score is 9 based on symptomatology but we do not know if he has COPD.  Symptom severity is rated below.  There is no chest x-ray report review.  No outside records for review.  No lab test for review.  OV 04/14/2018  Chief Complaint  Patient presents with   Follow-up    PFT today    Follow-up shortness of breath and coughin the setting of smoking. Here to review test results. . In the interim no new complaints. He says Symbicort works well for him. He does not want to change his inhaler to anything else. He feels symptoms are actually better compared to 02/24/2018 last saw him. He had pulmonary function test today that I personally reviewed and visualized. He is isolated reduction in diffusion capacity in the setting of Symbicort. He had a low-dose CT scan of the chest For surveillance annually has a 2 mm right  upper lobe nodule and emphysema. There are no other new findings.   CAT COPD Symptom & Quality of Life Score (GSK trademark) 0 is no burden. 5 is highest burden 02/24/2018   Never Cough -> Cough all the time 2  No phlegm in chest -> Chest is full of phlegm 0  No chest tightness -> Chest feels very tight 0  No dyspnea for 1 flight stairs/hill -> Very dyspneic for 1 flight of stairs 4  No limitations for ADL at home -> Very limited with ADL at home 0  Confident leaving home -> Not at all confident leaving home 0  Sleep soundly -> Do not sleep soundly because of lung condition 0  Lots of Energy -> No energy at all 3  TOTAL Score (max 40)  9      03/26/21- Gro[place], NP/ Hospital follow-up 71 year old male, former smoker quit in June 2022 (90-pack-year history).  Past medical history significant for COPD.  Patient of Dr. Chase Caller last seen in office on 03/26/2021 by pulmonary nurse practitioner for COPD exacerbation. He was seen as an inpatient for an exacerbation 03/04/21-03/13/2021.   Hospital follow up for COPD flare. Pt  endorsed ongoing tobacco abuse at the time of admission who had 1 week of upper respiratory symptoms and worsening shortness of breath prior to admission on 6/7 He was treated with  oxygen, azithromycin, steroids, and bronchodilators. He did require Bipap. Home maintenance is Breztri. He did not have a rescue inhaler, but has subsequently been prescribed one. Prior to admission he was smoking 2 PPD.  He has not smoked since admission. He is currently using 21 mg nicotine patches.    Pt. Presents for hospital follow up. He was admitted 03/04/2021-03/13/2021 with exacerbation. He was treated with azithromycin, steroids, and bronchodilators. He did require Bipap.He has quit smoking . Last cigarette was 03/04/2021. He is using the nicotine patches. He is wearing oxygen at 2 L with portable tank and 3 L with home tubing. Oxygen saturations were 97% on 2 L after ambulating into the room. We have  discussed weaning oxygen just while he is at rest , and only if he has an oxygen saturation monitor to ensure his saturations are > 88% at all times. He has used his rescue inhaler x 2 since discharge. He states he does have chest congestion. He is using his IS and flutter valve. He is not using mucinex to thin secretions. He is willing to try this. He is working with Physical therapy. We discussed pulmonary rehab once he completes his home PT. He is using a walker. He is compliant with his Breztri 2 puffs in the morning and 2 puffs in the evening.He feels the nebulizer does help, and we have dicussed using this in the morning and the evening until he feels better. He is compliant with Flonase and claritin for rhinitis control optimization  There is a significant need for education. Both patient and his wife will benefit from this. I am very encouraged that he has quit smoking. Nicotime patches are working. We discussed weaning from the 21 mg to the 14 mg to the 7 mg patches. He will need reinforcement of this, and support.  He denies any fever, no purulent secretions. States he feels much better, but is not back to baseline. He states he does have wheezing at intervals.Secretions are clear with slight yellow tinge.   04/22/2021- Interim hx  Patient presents today for 1 month follow-up with PFTs. Accompanied by his wife. He is doing well today. No acute complaints. He was admitted in June for COPD exacerbation. He has been on oxygen since discharge, he was walked in office today and was able to maintain O2 level >94% on room air. He is compliant with Breztri Aerosphere 2 puffs twice daily. Pulmonary function testing today showed mild obstructive airway disease with moderate diffusion defect. These remain stable since last PFTs in 2019. CXR in June showed interstitial thickening felt to be related to chronic bronchitis.      Test Results: CT chest, lung cancer screening 05/23/2019 reviewed-significant  emphysema, upper lobe predominant.  03/08/2021 CXR-hyperinflated, increased bibasilar markings but no discrete opacities.  Labs reviewed.  Bicarb 25 WBC 11.9 H/H 14.8/42.8  Echocardiogram 03/09/21-LVEF 60 to 65%, indeterminate diastolic function.  RV not well visualized.  Dilated IVC with greater than 50% respiratory variability.  LA and RA not well seen.  PFT 04/14/2018-mild obstruction without bronchodilator reversibility, FEV1 87% of predicted.  Mildly reduced total lung capacity-TLC 73% of predicted. DLCO moderately reduced, 58% predicted.    PFT-04/22/21-FVC 3.54 (92%), FEV1 2.27 (81%), ratio 64, TLC 113%, DLCOcor 14.05 (60%)   OV 07/21/2021  Subjective:  Patient ID: Mark Tate, male , DOB: 05-28-1951 , age 18 y.o. , MRN: [id number] , ADDRESS: [place] [address] PCP Wenda Low, MD Patient Care Team: Wenda Low, MD  as PCP - General (Internal Medicine) [name], MD as PCP - Cardiology (Cardiology)  This Provider for this visit: Treatment Team:  Attending Provider: Brand Males, MD    07/21/2021 -   Chief Complaint  Patient presents with   Follow-up    Pt states he has been doing okay since last visit. State he does have some SOB with exertion.   Follow-up Gold stage I COPD Follow-up heavy prior history of smoking Follow-up mild chronic cough because of COPD, interested in fish oil Hospitalization summer 2022 for respiratory issues  HPI OLYVER HAWES 71 y.o. -returns for follow-up.  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  He is doing well.  Symptom burden is minimal.  He is off oxygen after nurse practitioner walked him at last visit in July 2022.  I personally saw him on in 2019.  After that he was lost to follow-up because of COVID and then hospitalized and the nurse practitioner saw him.  He still uses nighttime oxygen.  He says it helps him.  His wife says that it is causing too much noise in the house and would like to return it.  They both  mutually agreed to have overnight oxygen study done and make an assessment.  He takes Best boy for cough which is mild.  He wants me to do a refill but review of the medication list shows that he is on Entresto and fish oil.  I suggested he stop at least the fish oil and reassess.  His most recent chest x-ray this year suggest chronic ILD changes but his last CT scan of the chest was 2 years ago.  He has stable dyspnea on exertion and significant visceral obesity.         OV 04/03/2022  Subjective:  Patient ID: Mark Tate, male , DOB: [birth year] , age 37 y.o. , MRN: [id number] , ADDRESS: [place] [address] PCP Wenda Low, MD Patient Care Team: Wenda Low, MD as PCP - General (Internal Medicine) [name], MD as PCP - Cardiology (Cardiology)  This Provider for this visit: Treatment Team:  Attending Provider: Brand Males, MD    04/03/2022 -   Chief Complaint  Patient presents with   Follow-up    Pt states he has been doing okay since last visit and denies any real complaints.    Follow-up Gold stage I COPD Follow-up heavy prior history of smoking Follow-up mild chronic cough because of COPD, interested in fish oil Hospitalization summer 2022 for respiratory issues New 38m lung nodules - Jan 2023 Thoracic [descending] aortic aneurysm 5.4 cm -discovered January 2023 -status post stent graft March 2023 by Dr. CMonica Martinez HPI DGaren [email or web address]. -returns for follow-up for his mild COPD.  Last visit we did a CT scan and found his descending thoracic aortic aneurysm had enlarged to 5.4 cm.  We referred him to vascular service.  He is status post stent graft in March 2023 and he is doing well.  His breathing is fine.  He is on Breztri triple inhaler therapy although he has only mild COPD.  He says this is helping him.  From a respiratory standpoint is able to mow the yard but he does get tired of he does too much activity gets  slightly short of breath.  Of note his [place] 2023 CT scan of the chest had some lung nodules.    CT Chest data  No results found.    PFT  Latest Ref Rng & Units 04/22/2021   10:28 AM 04/14/2018   10:00 AM  PFT Results  FVC-Pre L 3.56  3.79   FVC-Predicted Pre % 92  95   FVC-Post L 3.54  3.88   FVC-Predicted Post % 92  98   Pre FEV1/FVC % % 65  66   Post FEV1/FCV % % 64  65   FEV1-Pre L 2.30  2.51   FEV1-Predicted Pre % 82  86   FEV1-Post L 2.27  2.52   DLCO uncorrected ml/min/mmHg 14.09  16.56   DLCO UNC% % 60  69   DLCO corrected ml/min/mmHg 14.05    DLCO COR %Predicted % 60    DLVA Predicted % 55  65   TLC L 7.26  4.69   TLC % Predicted % 113  73   RV % Predicted % 150  23        has a past medical history of Allergies, Asthma, CAD in native artery (01/03/2020), COPD (chronic obstructive pulmonary disease) (HCC), COPD (chronic obstructive pulmonary disease) ([place]) (01/03/2020), Coughing, Fatigue, Neck swelling, Skin cancer, SOB (shortness of breath) on exertion, Tobacco use (01/03/2020), and Wheezing.   reports that he quit smoking about 13 months ago. His smoking use included cigarettes. He has a 90.00 pack-year smoking history. He has never used smokeless tobacco.  Past Surgical History:  Procedure Laterality Date   BASAL CELL CARCINOMA EXCISION     NECK   basal cell carcinoma excision Right    Right Ear   CATARACT EXTRACTION     MOHS SURGERY  2013, 2019   ON SCALP AND LEFT ARM    RIGHT/LEFT HEART CATH AND CORONARY ANGIOGRAPHY N/A 03/08/2020   Procedure: RIGHT/LEFT HEART CATH AND CORONARY ANGIOGRAPHY;  Surgeon: Burnell Blanks, MD;  Location: [facility];  Service: Cardiovascular;  Laterality: N/A;   THORACIC AORTIC ENDOVASCULAR STENT GRAFT N/A 12/08/2021   Procedure: THORACIC AORTIC ENDOVASCULAR STENT GRAFT;  Surgeon: Marty Heck, MD;  Location: [facility];  Service: Vascular;  Laterality: N/A;    No Known Allergies  Immunization  History  Administered Date(s) Administered   Fluad Quad(high Dose 65+) 07/07/2021   Influenza Split 07/27/2012, 07/09/2017, 07/26/2018   PFIZER(Purple Top)SARS-COV-2 Vaccination 11/30/2019, 12/26/2019, 11/14/2020   Pneumococcal Conjugate-13 04/12/2014   Pneumococcal Polysaccharide-23 04/22/2015   Td 11/26/2004   Tdap 05/12/2011   Zoster, Live 08/23/2012, 06/04/2019, 08/16/2019    Family History  Problem Relation Age of Onset   Cancer Mother        BONE, LUNG   Cancer Father        COLON, PROSTATE   Cancer - Lung Maternal Aunt      Current Outpatient Medications:    acetaminophen (TYLENOL) 500 MG tablet, Take 500 mg by mouth every 6 (six) hours as needed for headache. , Disp: , Rfl:    albuterol (PROVENTIL) (2.5 MG/3ML) 0.083% nebulizer solution, Take 3 mLs (2.5 mg total) by nebulization every 4 (four) hours as needed for wheezing or shortness of breath. (Patient taking differently: Take 2.5 mg by nebulization in the morning and at bedtime.), Disp: 75 mL, Rfl: 1   albuterol (VENTOLIN HFA) 108 (90 Base) MCG/ACT inhaler, Inhale 1-2 puffs into the lungs every 6 (six) hours as needed for wheezing or shortness of breath., Disp: 18 g, Rfl: 3   Ascorbic Acid (VITAMIN C) 1000 MG tablet, Take 1,000 mg by mouth daily., Disp: , Rfl:    aspirin EC 81 MG tablet, Take 1  tablet (81 mg total) by mouth daily., Disp: 90 tablet, Rfl: 3   atorvastatin (LIPITOR) 40 MG tablet, TAKE 1 TABLET BY MOUTH EVERY DAY, Disp: 90 tablet, Rfl: 3   benzonatate (TESSALON) 200 MG capsule, Take 1 capsule (200 mg total) by mouth 3 (three) times daily as needed for cough., Disp: 30 capsule, Rfl: 2   BREZTRI AEROSPHERE 160-9-4.8 MCG/ACT AERO, Take 2 puffs by mouth in the morning and at bedtime., Disp: 10.7 g, Rfl: 1   carvedilol (COREG) 6.25 MG tablet, TAKE 0.5 TABLETS (3.125 MG TOTAL) BY MOUTH 2 (TWO) TIMES DAILY WITH A MEAL., Disp: 90 tablet, Rfl: 2   cetirizine (ZYRTEC) 10 MG tablet, Take 10 mg by mouth daily., Disp: ,  Rfl:    cholecalciferol (VITAMIN D3) 25 MCG (1000 UNIT) tablet, Take 1,000 Units by mouth daily., Disp: , Rfl:    ENTRESTO 97-103 MG, TAKE 1 TABLET BY MOUTH 2 TIMES DAILY., Disp: 60 tablet, Rfl: 8   Multiple Vitamins-Minerals (CENTRUM SILVER 50+MEN) TABS, Take 1 tablet by mouth daily., Disp: , Rfl:    oxyCODONE-acetaminophen (PERCOCET) 5-325 MG tablet, Take 1 tablet by mouth every 6 (six) hours as needed for severe pain., Disp: 8 tablet, Rfl: 0      Objective:   Vitals:   04/03/22 1119  BP: 118/74  Pulse: 65  Temp: 97.9 F (36.6 C)  TempSrc: Oral  SpO2: 96%  Weight: 198 lb 3.2 oz (89.9 kg)  Height: '5\' 11"'$  (1.803 m)    Estimated body mass index is 27.64 kg/m as calculated from the following:   Height as of this encounter: '5\' 11"'$  (1.803 m).   Weight as of this encounter: 198 lb 3.2 oz (89.9 kg).  '@WEIGHTCHANGE'$ @  [employer]   04/03/22 1119  Weight: 198 lb 3.2 oz (89.9 kg)     Physical Exam    General: No distress. Looks well Neuro: Alert and Oriented x 3. GCS 15. Speech normal Psych: Pleasant Resp:  Barrel Chest - no.  Wheeze - no, Crackles - no, No overt respiratory distress CVS: Normal heart sounds. Murmurs - no Ext: Stigmata of Connective Tissue Disease - no HEENT: Normal upper airway. PEERL +. No post nasal drip        Assessment:       ICD-10-CM   1. Stage 1 mild COPD by GOLD classification (HCC)  J44.9     2. Stopped smoking with greater than 40 pack year history  Z87.891     3. Multiple lung nodules on CT  R91.8          Plan:     Patient Instructions     ICD-10-CM   1. Stage 1 mild COPD by GOLD classification ([place])  J44.9     2. Stopped smoking with greater than 40 pack year history  Z87.891     3. Multiple lung nodules on CT  R91.8       Stable mild copd  New 44m lung nodules noticed Jan 2023   Plan - take spiriva samples - do 2 squirts once daily   - do this for 2 weeks instead of breztri - and if working equally well this is  all you need  -otherwise continue breztril scheduled - flu shot and RSV vaccine in fall  Followup  - Jan 2024 after CT chest    SIGNATURE    Dr. MBrand Males M.D., F.C.C.P,  Pulmonary and Critical Care Medicine Staff Physician, CSlidell Memorial HospitalDirector - Interstitial Lung Disease  Program  [facility] [place], [place], [address]  Pager: [phone], If no answer or between  15:00h - 7:00h: call [phone] Telephone: [phone]  11:58 AM 04/03/2022

## 2022-04-03 NOTE — Patient Instructions (Addendum)
    ICD-10-CM   1. Stage 1 mild COPD by GOLD classification ([place])  J44.9     2. Stopped smoking with greater than 40 pack year history  Z87.891     3. Multiple lung nodules on CT  R91.8       Stable mild copd  New 59m lung nodules noticed Jan 2023   Plan - take spiriva samples - do 2 squirts once daily   - do this for 2 weeks instead of breztri - and if working equally well this is all you need  -otherwise continue breztril scheduled - flu shot and RSV vaccine in fall  Followup  - Jan 2024 after CT chest

## 2022-04-10 DIAGNOSIS — M67912 Unspecified disorder of synovium and tendon, left shoulder: Secondary | ICD-10-CM | POA: Diagnosis not present

## 2022-04-16 DIAGNOSIS — M6281 Muscle weakness (generalized): Secondary | ICD-10-CM | POA: Diagnosis not present

## 2022-04-16 DIAGNOSIS — M67912 Unspecified disorder of synovium and tendon, left shoulder: Secondary | ICD-10-CM | POA: Diagnosis not present

## 2022-04-20 ENCOUNTER — Telehealth: Payer: Self-pay | Admitting: Internal Medicine

## 2022-04-20 NOTE — Telephone Encounter (Signed)
pt came in office with his inhaler in his hand  wanting to discuss his inhaler with a nurse. i told the pt we would give him a call due to everyone being at lunch, and I told him if he wanted to come back in office and not receive a call then it would be anytime after 2 , since we were in lunch hour .   -PT HAS JUST BEEN PULLED BACK BY LESLIE R.

## 2022-04-20 NOTE — Telephone Encounter (Signed)
Spoke with the pt here in the office  He brought his spiriva sample and had questions on inhaler technique  I went over this with him  I watched him use the inhaler and he used correct technique  Nothing further needed

## 2022-04-21 DIAGNOSIS — M67912 Unspecified disorder of synovium and tendon, left shoulder: Secondary | ICD-10-CM | POA: Diagnosis not present

## 2022-04-21 DIAGNOSIS — M6281 Muscle weakness (generalized): Secondary | ICD-10-CM | POA: Diagnosis not present

## 2022-04-23 DIAGNOSIS — M67912 Unspecified disorder of synovium and tendon, left shoulder: Secondary | ICD-10-CM | POA: Diagnosis not present

## 2022-04-23 DIAGNOSIS — M6281 Muscle weakness (generalized): Secondary | ICD-10-CM | POA: Diagnosis not present

## 2022-04-29 DIAGNOSIS — H43812 Vitreous degeneration, left eye: Secondary | ICD-10-CM | POA: Diagnosis not present

## 2022-05-04 DIAGNOSIS — M67912 Unspecified disorder of synovium and tendon, left shoulder: Secondary | ICD-10-CM | POA: Diagnosis not present

## 2022-05-04 DIAGNOSIS — M6281 Muscle weakness (generalized): Secondary | ICD-10-CM | POA: Diagnosis not present

## 2022-05-06 DIAGNOSIS — M6281 Muscle weakness (generalized): Secondary | ICD-10-CM | POA: Diagnosis not present

## 2022-05-06 DIAGNOSIS — M67912 Unspecified disorder of synovium and tendon, left shoulder: Secondary | ICD-10-CM | POA: Diagnosis not present

## 2022-05-13 DIAGNOSIS — M67912 Unspecified disorder of synovium and tendon, left shoulder: Secondary | ICD-10-CM | POA: Diagnosis not present

## 2022-05-13 DIAGNOSIS — M6281 Muscle weakness (generalized): Secondary | ICD-10-CM | POA: Diagnosis not present

## 2022-05-15 DIAGNOSIS — M6281 Muscle weakness (generalized): Secondary | ICD-10-CM | POA: Diagnosis not present

## 2022-05-15 DIAGNOSIS — M67912 Unspecified disorder of synovium and tendon, left shoulder: Secondary | ICD-10-CM | POA: Diagnosis not present

## 2022-06-13 DIAGNOSIS — M25562 Pain in left knee: Secondary | ICD-10-CM | POA: Diagnosis not present

## 2022-06-15 DIAGNOSIS — R066 Hiccough: Secondary | ICD-10-CM | POA: Diagnosis not present

## 2022-06-23 ENCOUNTER — Other Ambulatory Visit: Payer: Self-pay | Admitting: Orthopedic Surgery

## 2022-06-23 DIAGNOSIS — M25562 Pain in left knee: Secondary | ICD-10-CM

## 2022-06-29 ENCOUNTER — Ambulatory Visit: Payer: Self-pay

## 2022-06-29 NOTE — Patient Outreach (Signed)
  Care Coordination   06/29/2022 Name: Mark Tate MRN: [id number] DOB: [birth year]   Care Coordination Outreach Attempts:  An unsuccessful telephone outreach was attempted today to offer the patient information about available care coordination services as a benefit of their health plan.   Follow Up Plan:  Additional outreach attempts will be made to offer the patient care coordination information and services.   Encounter Outcome:  No Answer  Care Coordination Interventions Activated:  No   Care Coordination Interventions:  No, not indicated    [facility] [phone]

## 2022-07-03 ENCOUNTER — Ambulatory Visit
Admission: RE | Admit: 2022-07-03 | Discharge: 2022-07-03 | Disposition: A | Discharge status: Home / Self Care | Payer: Medicare Other | Source: Ambulatory Visit | Attending: Orthopedic Surgery | Admitting: Orthopedic Surgery

## 2022-07-03 DIAGNOSIS — M25562 Pain in left knee: Secondary | ICD-10-CM

## 2022-07-03 DIAGNOSIS — S83512A Sprain of anterior cruciate ligament of left knee, initial encounter: Secondary | ICD-10-CM | POA: Diagnosis not present

## 2022-07-03 DIAGNOSIS — S83272A Complex tear of lateral meniscus, current injury, left knee, initial encounter: Secondary | ICD-10-CM | POA: Diagnosis not present

## 2022-07-03 DIAGNOSIS — S83232A Complex tear of medial meniscus, current injury, left knee, initial encounter: Secondary | ICD-10-CM | POA: Diagnosis not present

## 2022-07-03 DIAGNOSIS — M25462 Effusion, left knee: Secondary | ICD-10-CM | POA: Diagnosis not present

## 2022-07-08 ENCOUNTER — Telehealth: Payer: Self-pay

## 2022-07-09 NOTE — Patient Outreach (Signed)
  Care Coordination   Initial Visit Note    Name: DEVONDRE GUZZETTA MRN: [id number] DOB: 1951/03/23  RAMOND DARNELL is a 71 y.o. year old male who sees Wenda Low, MD for primary care. I spoke with  [name] by phone.  What matters to the patients health and wellness today?  No Concerns Expressed    Goals Addressed             This Visit's Progress    COMPLETED: Care Coordination Activities       Care Coordination Interventions: Reviewed treatment plans. Reports pending follow-up to discuss results of left knee MRI. Reports adhering to treatment plan and activity restrictions. Reviewed medications. Reports managing well. Denies concerns r/t medication management or prescription cost. Assessed social determinant of health barriers. AWV up to date. Completed on 03/18/22.        SDOH assessments and interventions completed:  Yes  SDOH Interventions Today    Flowsheet Row Most Recent Value  SDOH Interventions   Food Insecurity Interventions Intervention Not Indicated  Transportation Interventions Intervention Not Indicated        Care Coordination Interventions Activated:  Yes  Care Coordination Interventions:  Yes, provided   Follow up plan: No further intervention required.   Encounter Outcome:  Pt. Visit Completed    [facility] [phone]

## 2022-07-14 DIAGNOSIS — M25562 Pain in left knee: Secondary | ICD-10-CM | POA: Diagnosis not present

## 2022-07-16 IMAGING — CR DG CHEST 2V
2 series · 2 of 2 positions shown · non-contrast
Comparison: 01/12/2014

CLINICAL DATA: Worsening shortness of breath since [REDACTED], cough,
history COPD

EXAM:
CHEST - 2 VIEW

[chest pa]
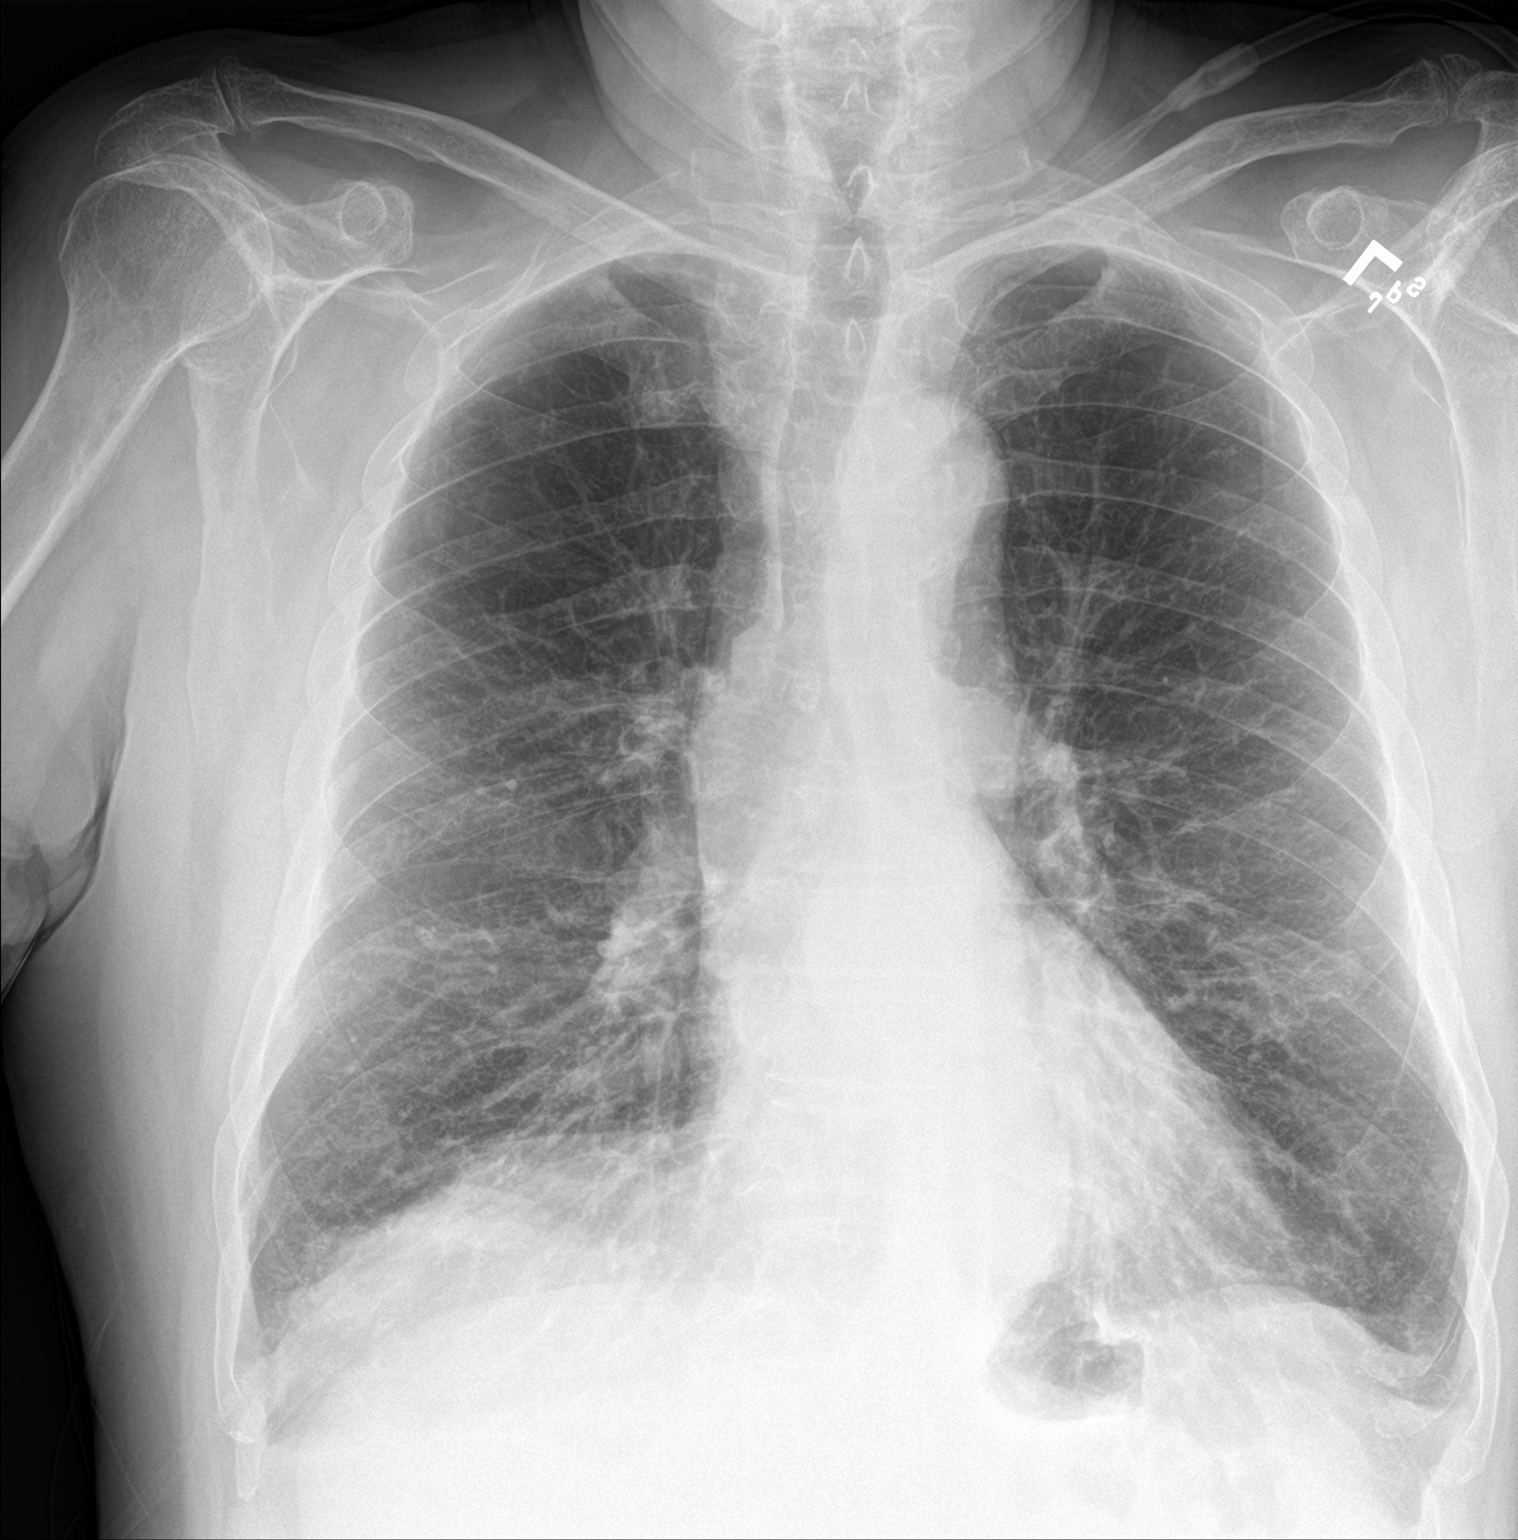

[chest lat]
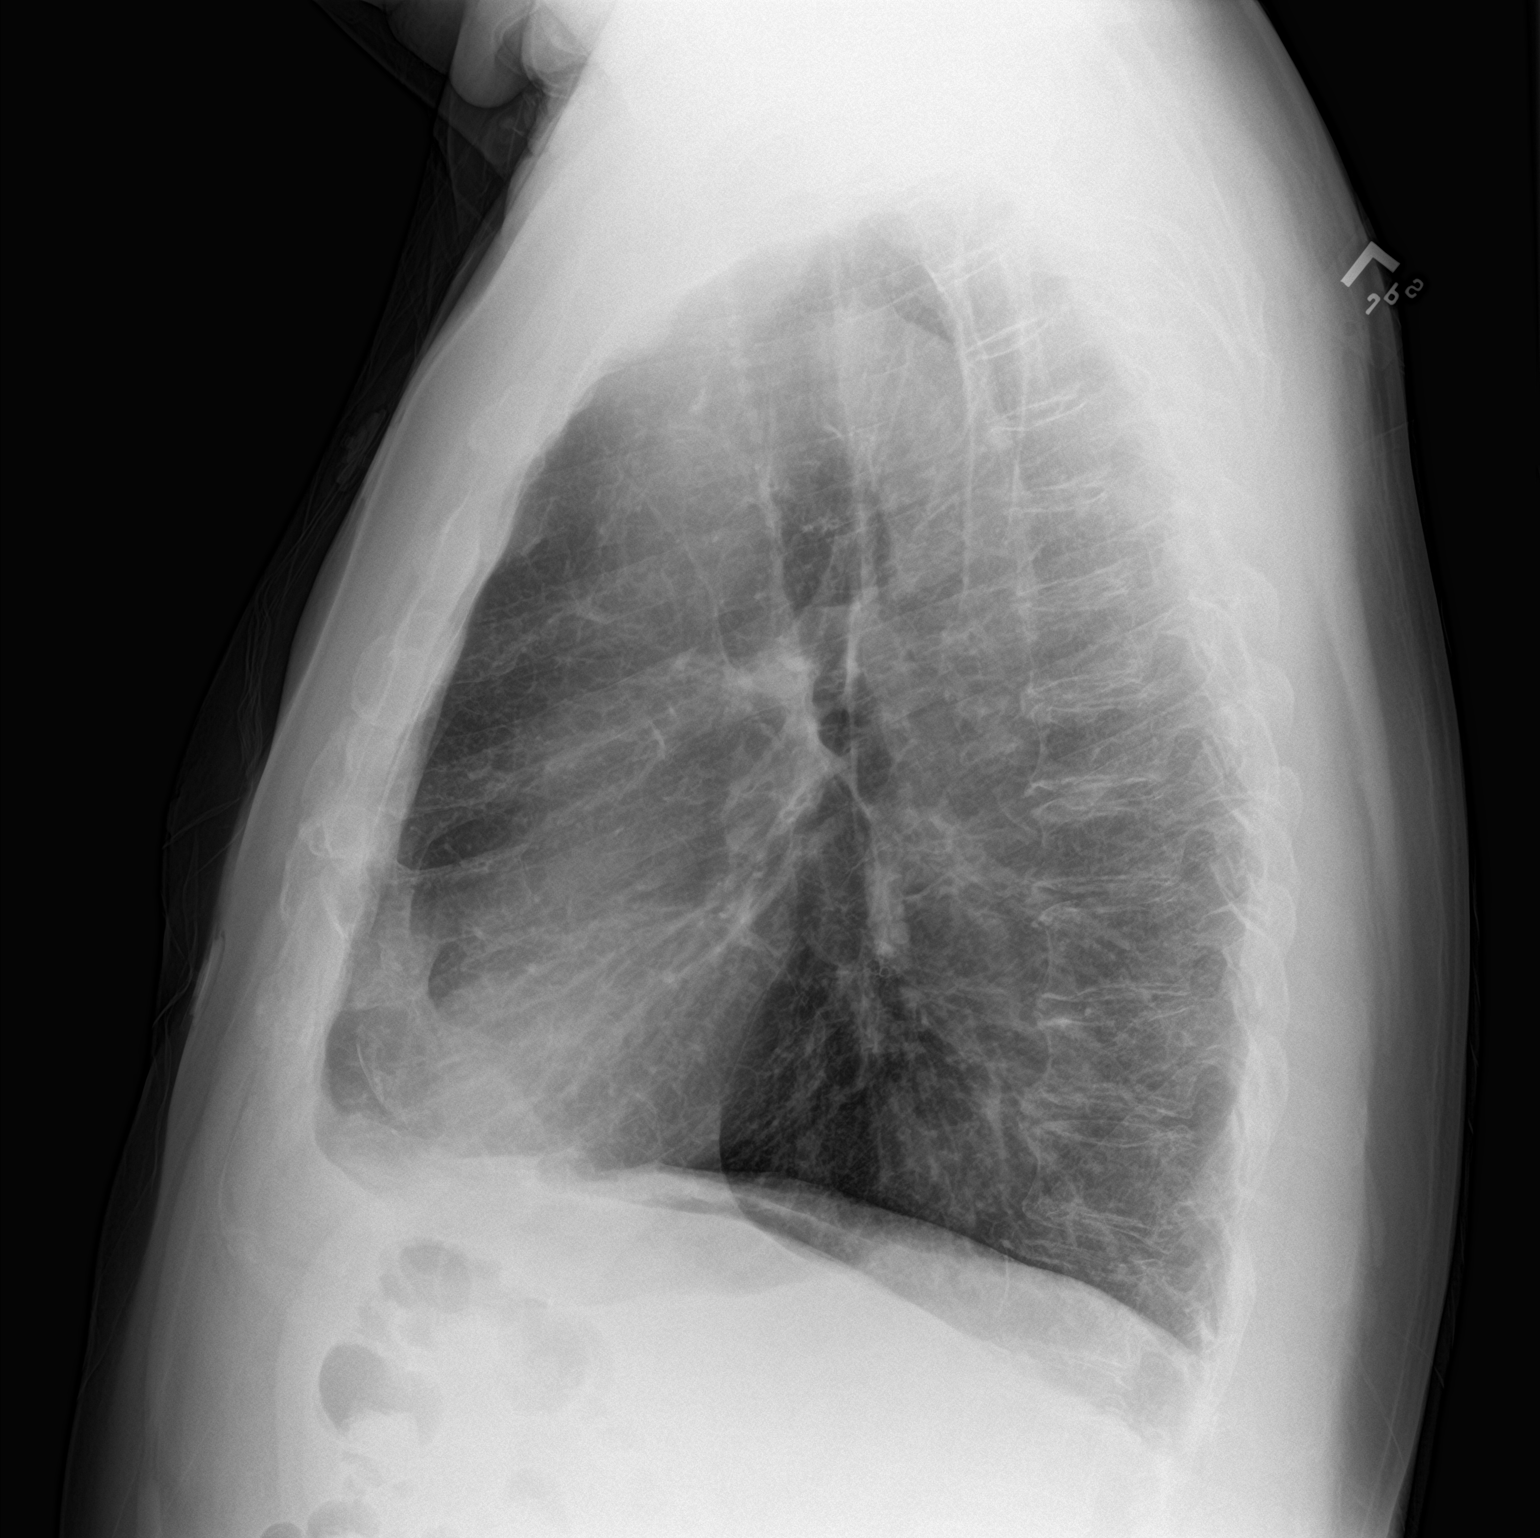

[2 of 2 positions shown; findings below may reference images not displayed]

FINDINGS: Normal heart size, mediastinal contours, and pulmonary vascularity.

Mildly tortuous thoracic aorta.

Emphysematous and bronchitic changes consistent with COPD.

Minimal diffuse interstitial changes in the mid to lower lungs with
bibasilar subsegmental atelectasis.

No acute infiltrate, pleural effusion, or pneumothorax.

Bones demineralized.
IMPRESSION: COPD changes with interstitial prominence at lung bases.

No acute abnormalities.

## 2022-08-10 DIAGNOSIS — J449 Chronic obstructive pulmonary disease, unspecified: Secondary | ICD-10-CM | POA: Diagnosis not present

## 2022-08-10 DIAGNOSIS — I428 Other cardiomyopathies: Secondary | ICD-10-CM | POA: Diagnosis not present

## 2022-08-10 DIAGNOSIS — M179 Osteoarthritis of knee, unspecified: Secondary | ICD-10-CM | POA: Diagnosis not present

## 2022-08-10 DIAGNOSIS — I2510 Atherosclerotic heart disease of native coronary artery without angina pectoris: Secondary | ICD-10-CM | POA: Diagnosis not present

## 2022-08-10 DIAGNOSIS — Z8679 Personal history of other diseases of the circulatory system: Secondary | ICD-10-CM | POA: Diagnosis not present

## 2022-08-10 DIAGNOSIS — I10 Essential (primary) hypertension: Secondary | ICD-10-CM | POA: Diagnosis not present

## 2022-08-10 DIAGNOSIS — I700 Atherosclerosis of aorta: Secondary | ICD-10-CM | POA: Diagnosis not present

## 2022-08-10 DIAGNOSIS — E7800 Pure hypercholesterolemia, unspecified: Secondary | ICD-10-CM | POA: Diagnosis not present

## 2022-08-10 DIAGNOSIS — Z9889 Other specified postprocedural states: Secondary | ICD-10-CM | POA: Diagnosis not present

## 2022-08-18 DIAGNOSIS — M25562 Pain in left knee: Secondary | ICD-10-CM | POA: Diagnosis not present

## 2022-11-18 ENCOUNTER — Other Ambulatory Visit: Payer: Self-pay | Admitting: Cardiovascular Disease

## 2022-12-17 NOTE — Progress Notes (Signed)
Cardiology Office Note   Date:  12/23/2022   ID:  Mark, Tate 1951/05/03, MRN [id number]  PCP:  Wenda Low, MD  Cardiologist:  Dr. Johnsie Cancel     No chief complaint on file.     History of Present Illness: Mark Tate is a 72 y.o. male who presents for f/u of DCM  Hx of CAD on CT of chest. Smoker with 45 pack year history Seen by pulmonary Ramaswamy Rx with spiriva and symbicort Lung cancer screening CT done 05/23/19 no cancer Commented on LM coronary calcification and aortic atherosclerosis with focal dilatation of the descending thoracic aorta 4.3 cm moderate bullous emphysema This was also noted on my review of CT done 03/09/18 The calcification is very mild with no other calcium noted in the coronary arteries to my review   He has no exertional chest pain  Some exertional dyspnea from COPD Has had COVID vaccine Has two daughters from two marriages and 10 grand children. 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    12/21/19 Myoview suggest old infarct  01/01/20 Echo with decreased, EF to 30-35%, G1 DD      Started on coreg and entresto   03/08/20 Cath with pRCA 30 %, mid RCA to dist RCA 20% stenosed, porx LAD to mLAD 20% stenosis, normal Rt and Left heart pressure. Done 03/08/20  Repeat echo done 04/22/20 EF normalized 60-65% on meds  Repeat echo 03/09/21 EF still normalized 60-65%   He has significant emphysema and still smokes Lung cancer CT picked up a descending thoracic aneurysm 5.5 cm Dr Carlis Abbott [facility] repaired with stent graft 12/08/21 No endoleak on CT 01/22/22 Needs f/u 3.5 cm AAA April 2025   Has not smoked since hospital d/c   Having some vertigo needs meclizine  Also needs f/u with [facility]   Past Medical History:  Diagnosis Date   Allergies    Asthma    CAD in native artery 01/03/2020   COPD (chronic obstructive pulmonary disease) (HCC)    COPD (chronic obstructive pulmonary disease) ([facility]) 01/03/2020   Coughing    Fatigue    Neck swelling    Skin cancer     SOB (shortness of breath) on exertion    Tobacco use 01/03/2020   Wheezing     Past Surgical History:  Procedure Laterality Date   BASAL CELL CARCINOMA EXCISION     NECK   basal cell carcinoma excision Right    Right Ear   CATARACT EXTRACTION     MOHS SURGERY  2013, 2019   ON SCALP AND LEFT ARM    RIGHT/LEFT HEART CATH AND CORONARY ANGIOGRAPHY N/A 03/08/2020   Procedure: RIGHT/LEFT HEART CATH AND CORONARY ANGIOGRAPHY;  Surgeon: Burnell Blanks, MD;  Location: [facility];  Service: Cardiovascular;  Laterality: N/A;   THORACIC AORTIC ENDOVASCULAR STENT GRAFT N/A 12/08/2021   Procedure: THORACIC AORTIC ENDOVASCULAR STENT GRAFT;  Surgeon: Marty Heck, MD;  Location: [facility];  Service: Vascular;  Laterality: N/A;     Current Outpatient Medications  Medication Sig Dispense Refill   acetaminophen (TYLENOL) 500 MG tablet Take 500 mg by mouth every 6 (six) hours as needed for headache.      albuterol (PROVENTIL) (2.5 MG/3ML) 0.083% nebulizer solution Take 3 mLs (2.5 mg total) by nebulization every 4 (four) hours as needed for wheezing or shortness of breath. 75 mL 1   albuterol (VENTOLIN HFA) 108 (90 Base) MCG/ACT inhaler Inhale 1-2 puffs into the lungs every  6 (six) hours as needed for wheezing or shortness of breath. 18 g 3   Ascorbic Acid (VITAMIN C) 1000 MG tablet Take 1,000 mg by mouth daily.     aspirin EC 81 MG tablet Take 1 tablet (81 mg total) by mouth daily. 90 tablet 3   atorvastatin (LIPITOR) 40 MG tablet Take 1 tablet (40 mg total) by mouth daily. Please keep scheduled appointment for future refills. Thank you. 90 tablet 0   BREZTRI AEROSPHERE 160-9-4.8 MCG/ACT AERO Take 2 puffs by mouth in the morning and at bedtime. 10.7 g 1   carvedilol (COREG) 6.25 MG tablet TAKE 0.5 TABLETS (3.125 MG TOTAL) BY MOUTH 2 (TWO) TIMES DAILY WITH A MEAL. 90 tablet 2   cetirizine (ZYRTEC) 10 MG tablet Take 10 mg by mouth daily.     cholecalciferol (VITAMIN D3) 25 MCG (1000  UNIT) tablet Take 1,000 Units by mouth daily.     ENTRESTO 97-103 MG TAKE 1 TABLET BY MOUTH 2 TIMES DAILY. 60 tablet 8   Multiple Vitamins-Minerals (CENTRUM SILVER 50+MEN) TABS Take 1 tablet by mouth daily.     Tiotropium Bromide Monohydrate (SPIRIVA RESPIMAT) 2.5 MCG/ACT AERS Inhale 2 puffs into the lungs daily. 4 g 0   No current facility-administered medications for this visit.    Allergies:   Patient has no known allergies.    Social History:  The patient  reports that he quit smoking about 21 months ago. His smoking use included cigarettes. He has a 90.00 pack-year smoking history. He has never used smokeless tobacco. He reports that he does not currently use alcohol. He reports that he does not use drugs.   Family History:  The patient's family history includes Cancer in his father and mother; Cancer - Lung in his maternal aunt.    ROS:  General:no colds or fevers, no weight changes Skin:no rashes or ulcers HEENT:no blurred vision, no congestion CV:see HPI PUL:see HPI GI:no diarrhea constipation or melena, no indigestion GU:no hematuria, no dysuria MS:no joint pain, no claudication Neuro:no syncope, no lightheadedness Endo:no diabetes, no thyroid disease  Wt Readings from Last 3 Encounters:  12/23/22 211 lb (95.7 kg)  04/03/22 198 lb 3.2 oz (89.9 kg)  01/27/22 202 lb (91.6 kg)     PHYSICAL EXAM: BP (!) 120/90   Pulse 71   Ht 5\' 11"  (1.803 m)   Wt 211 lb (95.7 kg)   SpO2 95%   BMI 29.43 kg/m  Affect appropriate Healthy:  appears stated age 33: normal Neck supple with no adenopathy JVP normal no bruits no thyromegaly Lungs clear with no wheezing and good diaphragmatic motion Heart:  S1/S2 no murmur, no rub, gallop or click PMI normal Abdomen: benighn, BS positve, no tenderness, no AAA no bruit.  No HSM or HJR Distal pulses intact with no bruits No edema Neuro non-focal Skin warm and dry No muscular weakness  EKG:   12/23/2022 SR rate 71 normal      Recent Labs: No results found for requested labs within last 365 days.    Lipid Panel    Component Value Date/Time   CHOL 128 02/12/2020 1053   TRIG 246 (H) 02/12/2020 1053   HDL 37 (L) 02/12/2020 1053   CHOLHDL 3.5 02/12/2020 1053   LDLCALC 52 02/12/2020 1053       Other studies Reviewed: Additional studies/ records that were reviewed today include:. Cardiac cath 03/08/20 Prox RCA lesion is 30% stenosed. Mid RCA to Dist RCA lesion is 20% stenosed. Prox LAD to  Mid LAD lesion is 20% stenosed.   1. Mild non-obstructive CAD 2. Normal right and left heart pressures.    Recommendations: Medical management of mild non-obstructive CAD.   Echo:  04/22/20 IMPRESSIONS     1. COmpared to echo from April 2021 LVEF is improved.   2. Left ventricular ejection fraction, by estimation, is 60 to 65%. The  left ventricle has normal function. The left ventricle has no regional  wall motion abnormalities. Left ventricular diastolic parameters are  indeterminate.   3. Right ventricular systolic function is normal. The right ventricular  size is normal.   4. The mitral valve is normal in structure. Trivial mitral valve  regurgitation.   5. The aortic valve is normal in structure. Aortic valve regurgitation is  not visualized.   ASSESSMENT AND PLAN:  1.  NICM with EF 30-35%, on coreg 6.25 BID and entresto 97-103  TTE done 03/09/21 EF 60-65% continue medical Rx no indication for AICD Improved   2.  Mild nonobstructive CAD.  On cath 03/08/20 continue statin and BB  3.  HLD continue statin  4.  Tobacco use quit 2 years ago improved lungs ok on CTA;s done for aneurysm   5.  COPD/dyspnea stable and followed by pulmonary. Lung cancer screening CT last 10/22/21 new nodules < 4 mm f/u Ramaswamy advanced emphysema with bronchial impaction Continue Breztril and spiriva  6. Descending Thoracic Aneurysm:  noted on CT above 5.5 cm has been seen by Dr Carlis Abbott [facility]3/13/23 post stent graft Rx [place]  TEVAR covering left subclavian   7. Vertigo:  f/u primary I did call in some meclizine for him to take PRN    Current medicines are reviewed with the patient today.  The patient Has no concerns regarding medicines.  The following changes have been made:  See above Labs/ tests ordered today include:see above  Disposition:   FU:in a year refer to Dr Carlis Abbott for f/u [facility]  Signed, Jenkins Rouge, MD  12/23/2022 3:11 PM    [facility] [address], [place], [place], [place] Phone: [phone]; Fax: [phone]

## 2022-12-23 ENCOUNTER — Ambulatory Visit: Payer: Medicare Other | Attending: Cardiovascular Disease | Admitting: Cardiovascular Disease

## 2022-12-23 ENCOUNTER — Encounter: Payer: Self-pay | Admitting: Cardiovascular Disease

## 2022-12-23 VITALS — BP 120/90 | HR 71 | Ht 71.0 in | Wt 211.0 lb

## 2022-12-23 DIAGNOSIS — I712 Thoracic aortic aneurysm, without rupture, unspecified: Secondary | ICD-10-CM | POA: Diagnosis not present

## 2022-12-23 DIAGNOSIS — J432 Centrilobular emphysema: Secondary | ICD-10-CM | POA: Diagnosis not present

## 2022-12-23 DIAGNOSIS — I428 Other cardiomyopathies: Secondary | ICD-10-CM

## 2022-12-23 MED ORDER — MECLIZINE HCL 25 MG PO TABS: 25.0000 mg | ORAL_TABLET | Freq: Three times a day (TID) | ORAL | 0 refills | Status: AC | PRN

## 2022-12-23 NOTE — Patient Instructions (Addendum)
Medication Instructions:  Your physician has recommended you make the following change in your medication:  1-TAKE meclizine 25 mg by mouth every 8 hours as needed for dizziness.  *If you need a refill on your cardiac medications before your next appointment, please call your pharmacy*  Lab Work: If you have labs (blood work) drawn today and your tests are completely normal, you will receive your results only by: [facility] (if you have MyChart) OR A paper copy in the mail If you have any lab test that is abnormal or we need to change your treatment, we will call you to review the results.  Testing/Procedures: None ordered today.  Follow-Up: At [facility], you and your health needs are our priority.  As part of our continuing mission to provide you with exceptional heart care, we have created designated Provider Care Teams.  These Care Teams include your primary Cardiologist (physician) and Advanced Practice Providers (APPs -  Physician Assistants and Nurse Practitioners) who all work together to provide you with the care you need, when you need it.  We recommend signing up for the patient portal called "MyChart".  Sign up information is provided on this After Visit Summary.  MyChart is used to connect with patients for Virtual Visits (Telemedicine).  Patients are able to view lab/test results, encounter notes, upcoming appointments, etc.  Non-urgent messages can be sent to your provider as well.   To learn more about what you can do with MyChart, go to [email or web address].    Your next appointment:   6 month   Provider:   Jenkins Rouge, MD

## 2023-01-05 ENCOUNTER — Other Ambulatory Visit: Payer: Self-pay

## 2023-01-05 DIAGNOSIS — I7123 Aneurysm of the descending thoracic aorta, without rupture: Secondary | ICD-10-CM

## 2023-01-29 ENCOUNTER — Ambulatory Visit (HOSPITAL_COMMUNITY)
Admission: RE | Admit: 2023-01-29 | Discharge: 2023-01-29 | Disposition: A | Discharge status: Home / Self Care | Payer: Medicare Other | Source: Ambulatory Visit | Attending: Vascular Surgery | Admitting: Vascular Surgery

## 2023-01-29 DIAGNOSIS — I7123 Aneurysm of the descending thoracic aorta, without rupture: Secondary | ICD-10-CM | POA: Insufficient documentation

## 2023-01-29 DIAGNOSIS — I7143 Infrarenal abdominal aortic aneurysm, without rupture: Secondary | ICD-10-CM | POA: Diagnosis not present

## 2023-01-29 DIAGNOSIS — I7121 Aneurysm of the ascending aorta, without rupture: Secondary | ICD-10-CM | POA: Diagnosis not present

## 2023-01-29 MED ORDER — IOHEXOL 350 MG/ML SOLN
100.0000 mL | Freq: Once | INTRAVENOUS | Status: AC | PRN
  Administered 2023-01-29: 100 mL via INTRAVENOUS

## 2023-01-29 MED ORDER — IOHEXOL 350 MG/ML SOLN
1.0000 mL | Freq: Once | INTRAVENOUS | Status: AC | PRN
  Administered 2023-01-29: 1 mL via INTRAVENOUS

## 2023-01-30 ENCOUNTER — Other Ambulatory Visit: Payer: Self-pay | Admitting: Cardiovascular Disease

## 2023-02-02 ENCOUNTER — Encounter: Payer: Self-pay | Admitting: Vascular Surgery

## 2023-02-02 ENCOUNTER — Ambulatory Visit: Payer: Medicare Other | Admitting: Vascular Surgery

## 2023-02-02 VITALS — BP 138/90 | HR 78 | Temp 98.6°F | Resp 18 | Ht 71.0 in | Wt 208.0 lb

## 2023-02-02 DIAGNOSIS — I7123 Aneurysm of the descending thoracic aorta, without rupture: Secondary | ICD-10-CM

## 2023-02-02 DIAGNOSIS — I714 Abdominal aortic aneurysm, without rupture, unspecified: Secondary | ICD-10-CM | POA: Insufficient documentation

## 2023-02-02 DIAGNOSIS — I7143 Infrarenal abdominal aortic aneurysm, without rupture: Secondary | ICD-10-CM | POA: Diagnosis not present

## 2023-02-02 NOTE — Progress Notes (Signed)
Patient name: Mark Tate MRN: [id number] DOB: 1951/01/11 Sex: male  REASON FOR VISIT: 9 month follow-up after TEVAR for thoracic aneurysm  HPI: Mark Tate is a 72 y.o. male that presents for 9 month follow-up after TEVAR for 5.8 cm descending thoracic aneurysm on 12/08/2021.  He does have a AAA that we are following as well.  He reports no new issues today other than lack of energy.  He did get a CT scan prior to the visit.  Past Medical History:  Diagnosis Date   Allergies    Asthma    CAD in native artery 01/03/2020   COPD (chronic obstructive pulmonary disease) (HCC)    COPD (chronic obstructive pulmonary disease) (HCC) 01/03/2020   Coughing    Fatigue    Neck swelling    Skin cancer    SOB (shortness of breath) on exertion    Tobacco use 01/03/2020   Wheezing     Past Surgical History:  Procedure Laterality Date   BASAL CELL CARCINOMA EXCISION     NECK   basal cell carcinoma excision Right    Right Ear   CATARACT EXTRACTION     MOHS SURGERY  2013, 2019   ON SCALP AND LEFT ARM    RIGHT/LEFT HEART CATH AND CORONARY ANGIOGRAPHY N/A 03/08/2020   Procedure: RIGHT/LEFT HEART CATH AND CORONARY ANGIOGRAPHY;  Surgeon: Kathleene Hazel, MD;  Location: [facility];  Service: Cardiovascular;  Laterality: N/A;   THORACIC AORTIC ENDOVASCULAR STENT GRAFT N/A 12/08/2021   Procedure: THORACIC AORTIC ENDOVASCULAR STENT GRAFT;  Surgeon: Cephus Shelling, MD;  Location: [facility];  Service: Vascular;  Laterality: N/A;    Family History  Problem Relation Age of Onset   Cancer Mother        BONE, LUNG   Cancer Father        COLON, PROSTATE   Cancer - Lung Maternal Aunt     SOCIAL HISTORY: Social History   Tobacco Use   Smoking status: Former    Packs/day: 2.00    Years: 45.00    Additional pack years: 0.00    Total pack years: 90.00    Types: Cigarettes    Quit date: 02/2021    Years since quitting: 1.9   Smokeless tobacco: Never  Substance Use Topics    Alcohol use: Not Currently    No Known Allergies  Current Outpatient Medications  Medication Sig Dispense Refill   acetaminophen (TYLENOL) 500 MG tablet Take 500 mg by mouth every 6 (six) hours as needed for headache.      albuterol (PROVENTIL) (2.5 MG/3ML) 0.083% nebulizer solution Take 3 mLs (2.5 mg total) by nebulization every 4 (four) hours as needed for wheezing or shortness of breath. 75 mL 1   albuterol (VENTOLIN HFA) 108 (90 Base) MCG/ACT inhaler Inhale 1-2 puffs into the lungs every 6 (six) hours as needed for wheezing or shortness of breath. 18 g 3   Ascorbic Acid (VITAMIN C) 1000 MG tablet Take 1,000 mg by mouth daily.     aspirin EC 81 MG tablet Take 1 tablet (81 mg total) by mouth daily. 90 tablet 3   atorvastatin (LIPITOR) 40 MG tablet Take 1 tablet (40 mg total) by mouth daily. Please keep scheduled appointment for future refills. Thank you. 90 tablet 0   BREZTRI AEROSPHERE 160-9-4.8 MCG/ACT AERO Take 2 puffs by mouth in the morning and at bedtime. 10.7 g 1   carvedilol (COREG) 6.25 MG tablet TAKE 0.5 TABLETS (  3.125 MG TOTAL) BY MOUTH 2 (TWO) TIMES DAILY WITH A MEAL. 90 tablet 3   cetirizine (ZYRTEC) 10 MG tablet Take 10 mg by mouth daily.     cholecalciferol (VITAMIN D3) 25 MCG (1000 UNIT) tablet Take 1,000 Units by mouth daily.     ENTRESTO 97-103 MG TAKE 1 TABLET BY MOUTH 2 TIMES DAILY. 60 tablet 8   meclizine (ANTIVERT) 25 MG tablet Take 1 tablet (25 mg total) by mouth 3 (three) times daily as needed for dizziness. 30 tablet 0   Multiple Vitamins-Minerals (CENTRUM SILVER 50+MEN) TABS Take 1 tablet by mouth daily.     Tiotropium Bromide Monohydrate (SPIRIVA RESPIMAT) 2.5 MCG/ACT AERS Inhale 2 puffs into the lungs daily. 4 g 0   No current facility-administered medications for this visit.    REVIEW OF SYSTEMS:  [X]  denotes positive finding, [ ]  denotes negative finding Cardiac  Comments:  Chest pain or chest pressure:    Shortness of breath upon exertion:    Short of  breath when lying flat:    Irregular heart rhythm:        Vascular    Pain in calf, thigh, or hip brought on by ambulation:    Pain in feet at night that wakes you up from your sleep:     Blood clot in your veins:    Leg swelling:         Pulmonary    Oxygen at home:    Productive cough:     Wheezing:         Neurologic    Sudden weakness in arms or legs:     Sudden numbness in arms or legs:     Sudden onset of difficulty speaking or slurred speech:    Temporary loss of vision in one eye:     Problems with dizziness:         Gastrointestinal    Blood in stool:     Vomited blood:         Genitourinary    Burning when urinating:     Blood in urine:        Psychiatric    Major depression:         Hematologic    Bleeding problems:    Problems with blood clotting too easily:        Skin    Rashes or ulcers:        Constitutional    Fever or chills:      PHYSICAL EXAM: Vitals:   02/02/23 1424  BP: (!) 138/90  Pulse: 78  Resp: 18  Temp: 98.6 F (37 C)  TempSrc: Temporal  SpO2: 96%  Weight: 208 lb (94.3 kg)  Height: 5\' 11"  (1.803 m)    GENERAL: The patient is a well-nourished male, in no acute distress. The vital signs are documented above. CARDIAC: There is a regular rate and rhythm.  VASCULAR:  Bilateral femoral pulses palpable Bilateral DP pulses palpable PULMONARY: No respiratory distress. ABDOMEN: Soft and non-tender. MUSCULOSKELETAL: There are no major deformities or cyanosis. NEUROLOGIC: No focal weakness or paresthesias are detected. PSYCHIATRIC: The patient has a normal affect.  DATA:   CTA C/A/P reviewed from 01/30/23 with descending thoracic stent graft in excellent position and resolution of his aneurysm sac now the diameter of stent graft.  He does have a 4 cm abdominal aortic aneurysm.  CTA chest abdomen pelvis reviewed from 01/22/2022 with stent graft in the descending thoracic aorta with no evidence of endoleak and  exclusion of the native  aneurysm sac.  There is evidence of a 3.5 cm abdominal aortic aneurysm.  Assessment/Plan:  72 year old male status post TEVAR for a 5.8 cm descending thoracic aortic aneurysm on 12/08/2021 that presents for 9 month follow-up.  I reviewed his CTA on 01/30/23 and the stent grafts are in excellent position with no endoleak.  Aneurysm sac has now decreased in size and is the native diameter of the stent graft.  There is a little bit of mural thrombus in the stent that we will need to monitor.  He has a 4 cm abdominal aortic aneurysm.  Discussed no indication for surgical intervention on his abdominal aortic aneurysm and we will follow this with another CTA chest abdomen pelvis in 1 year also do surveillance of stent graft.  Discussed in men repair AAA at >5.5 cm.  Needs to be on aspirin for risk reduction.     Cephus Shelling, MD [facility] Office: [phone]

## 2023-02-14 ENCOUNTER — Other Ambulatory Visit: Payer: Self-pay | Admitting: Cardiovascular Disease

## 2023-03-03 DIAGNOSIS — H1045 Other chronic allergic conjunctivitis: Secondary | ICD-10-CM | POA: Diagnosis not present

## 2023-03-14 IMAGING — CT CT ANGIO CHEST
2 of 8 series · 10 of 36 positions shown · non-contrast
Comparison: CT scan of the chest 10/21/2021; 05/23/2019

CLINICAL DATA: Thoracic aortic disease, preoperative planning

EXAM:
CT ANGIOGRAPHY CHEST, ABDOMEN AND PELVIS
TECHNIQUE: Non-contrast CT of the chest was initially obtained.

[Series 4: cta cap aneurysm 2.00 bv36 s3 axial arterial · axial · arterial · 0.82mm/px · z∈[+1381,+1891]mm · 9 of 319 slices shown]
[im 32/319  lung]
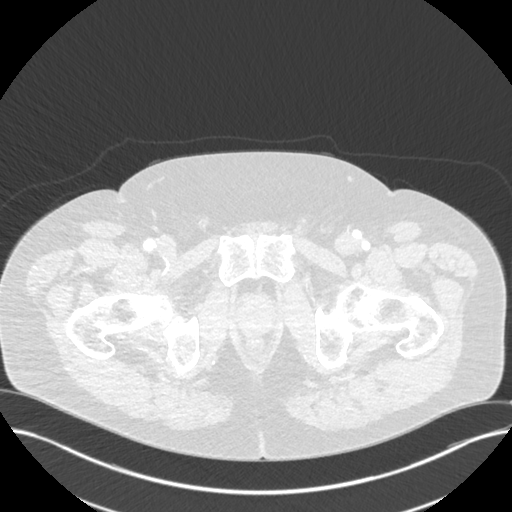
[im 64/319  mediastinal]
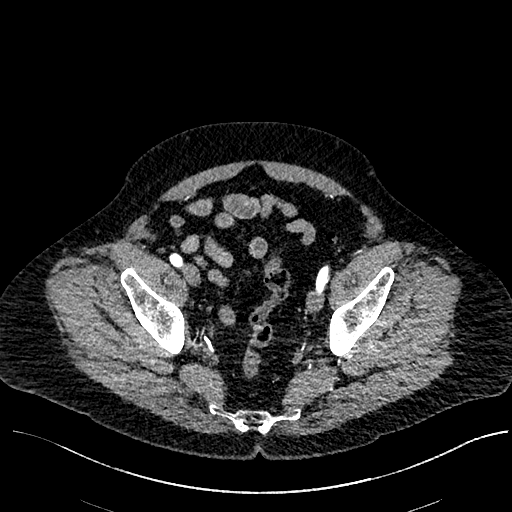
[im 96/319  lung]
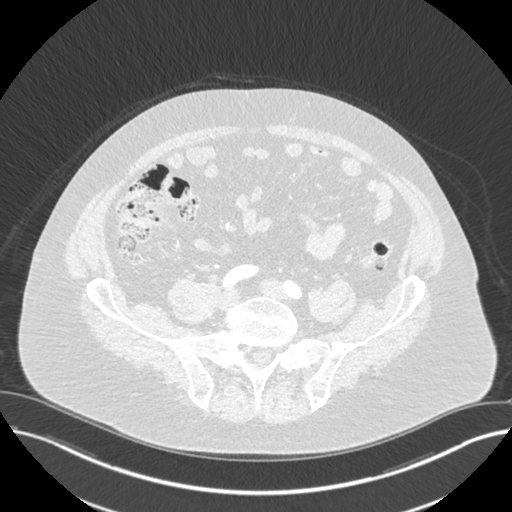
[im 128/319  mediastinal]
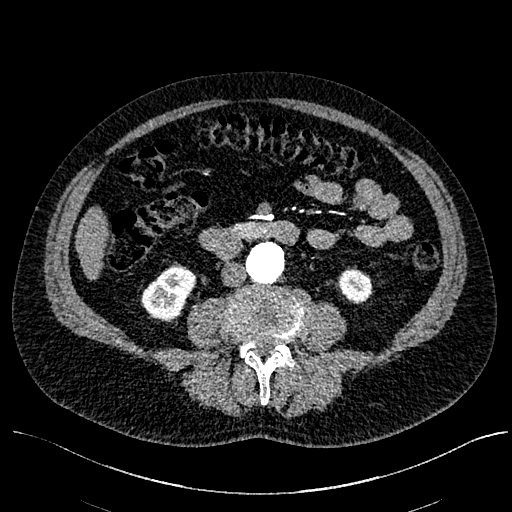
[im 160/319  lung]
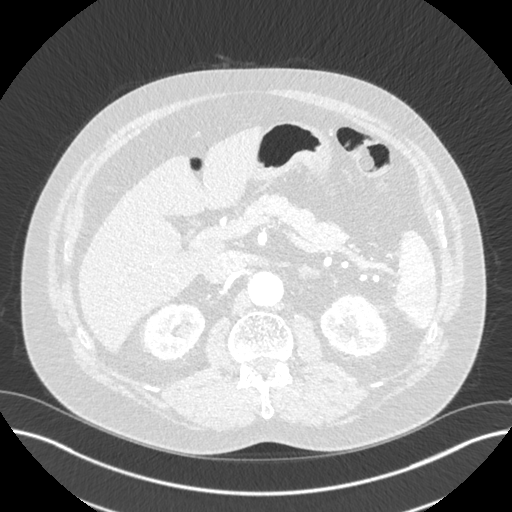
[im 191/319  mediastinal]
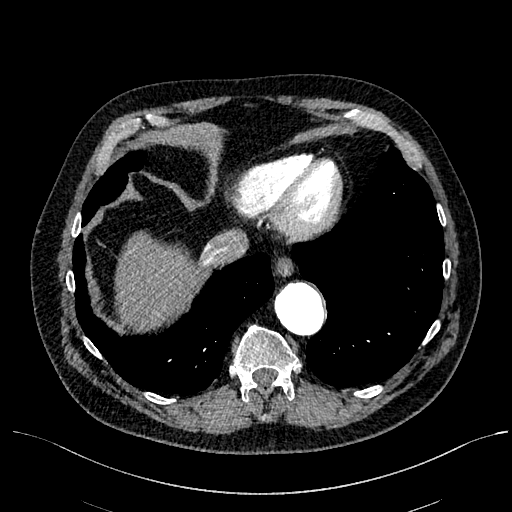
[im 223/319  lung]
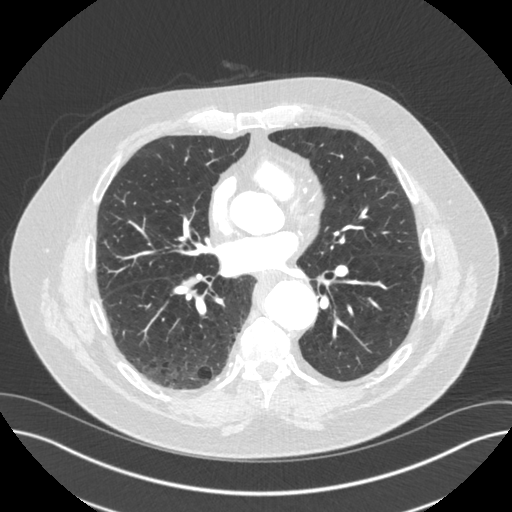
[im 255/319  mediastinal]
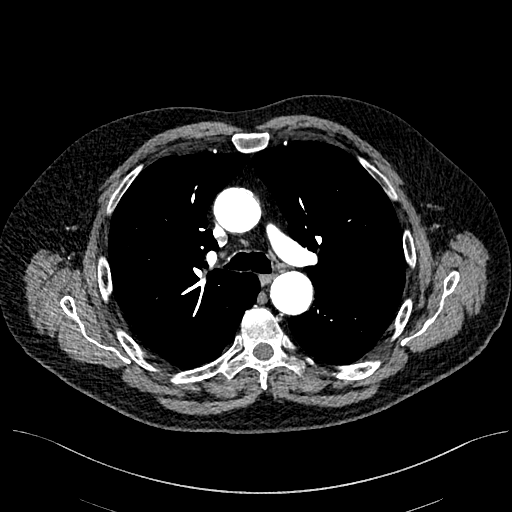
[im 287/319  lung]
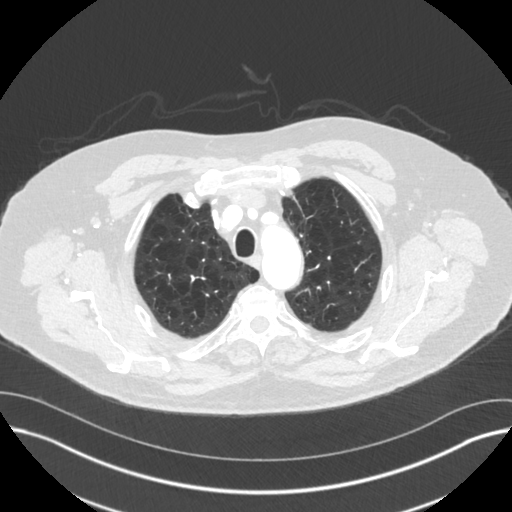

[Series 6: cta cap aneurysm 2.00 bv36 s3 cor st · coronal · 0.82mm/px · 1 of 170 slices shown]
[im 85/170  mediastinal]
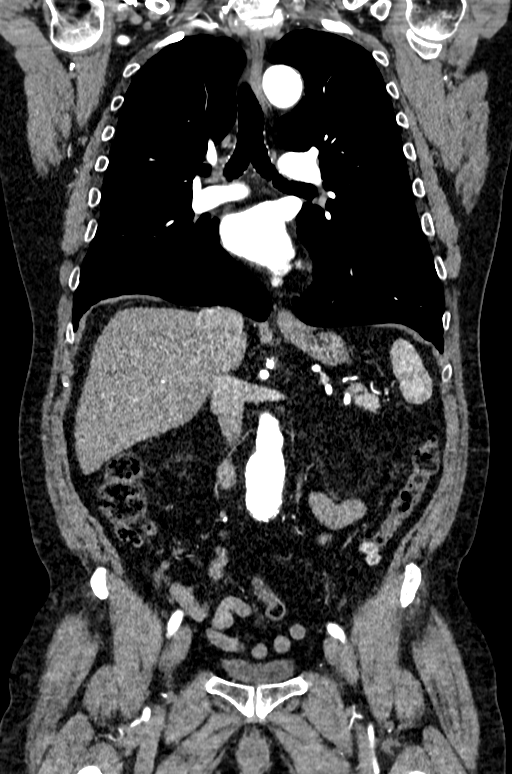

[10 of 36 positions shown; findings below may reference images not displayed]

Multidetector CT imaging through the chest, abdomen and pelvis was
performed using the standard protocol during bolus administration of
intravenous contrast. Multiplanar reconstructed images and MIPs were
obtained and reviewed to evaluate the vascular anatomy.

RADIATION DOSE REDUCTION: This exam was performed according to the
departmental dose-optimization program which includes automated
exposure control, adjustment of the mA and/or kV according to
patient size and/or use of iterative reconstruction technique.

CONTRAST:  75mL U7H3F5-J7T IOPAMIDOL (U7H3F5-J7T) INJECTION 76%
FINDINGS: CTA CHEST FINDINGS

Cardiovascular: The aortic root is normal in caliber at 3.8 cm. No
effacement of the [place] junction. Ectatic bordering on
aneurysmal ascending thoracic aorta with a maximal diameter of
cm. The aortic arch is mildly ectatic at 3.5 cm. The proximal
descending thoracic aorta is ectatic at 3.6 cm. The focal eccentric
aneurysmal dilation of the descending thoracic aorta with a maximal
transverse diameter of 5.4 cm. The more distal thoracic aorta is
both tortuous and mildly aneurysmal with a maximal diameter of
cm. Scattered heterogeneous atherosclerotic plaque. Unremarkable
main pulmonary artery. The heart is normal in size. No pericardial
effusion.

Mediastinum/Nodes: Unremarkable CT appearance of the thyroid gland.
No suspicious mediastinal or hilar adenopathy. No soft tissue
mediastinal mass. The thoracic esophagus is unremarkable.

Lungs/Pleura: Advanced centrilobular pulmonary emphysema. Diffuse
mild bronchial wall thickening. Several areas of focal bronchial
impaction and tree-in-bud micro nodularity are evident in the left
upper and right lower lobe. Findings remain grossly unchanged across
prior studies.

Musculoskeletal: No acute fracture or aggressive appearing lytic or
blastic osseous lesion.

Review of the MIP images confirms the above findings.

CTA ABDOMEN AND PELVIS FINDINGS

VASCULAR

Aorta: Multifocal atherosclerotic plaque. Fusiform aneurysmal
dilation of the infrarenal abdominal aorta with a maximal diameter
of 3.5 cm. No dissection.

Celiac: Patent without evidence of aneurysm, dissection, vasculitis
or significant stenosis.

SMA: Patent without evidence of aneurysm, dissection, vasculitis or
significant stenosis.

Renals: Both renal arteries are patent without evidence of aneurysm,
dissection, vasculitis, fibromuscular dysplasia or significant
stenosis.

IMA: Patent without evidence of aneurysm, dissection, vasculitis or
significant stenosis.

Inflow: Patent without evidence of aneurysm, dissection, vasculitis
or significant stenosis.

Veins: No obvious venous abnormality.

Review of the MIP images confirms the above findings.

NON-VASCULAR

Hepatobiliary: Normal hepatic contour and morphology. No discrete
hepatic lesion. Calcified stones present in the gallbladder lumen.
No evidence of cholecystitis.

Pancreas: Unremarkable. No pancreatic ductal dilatation or
surrounding inflammatory changes.

Spleen: Normal in size without focal abnormality.

Adrenals/Urinary Tract: Adrenal glands are normal. No
hydronephrosis, nephrolithiasis or enhancing renal mass. Small
subcentimeter low-attenuation renal lesions are too small for
accurate characterization but statistically highly likely benign
cysts. Unremarkable ureters and bladder.

Stomach/Bowel: Colonic diverticular disease without CT evidence of
active inflammation. No evidence of obstruction or focal bowel wall
thickening. Normal appendix in the right lower quadrant. The
terminal ileum is unremarkable.

Lymphatic: No suspicious lymphadenopathy.

Reproductive: Prostate is unremarkable.

Other: No abdominal wall hernia or abnormality. No abdominopelvic
ascites.

Musculoskeletal: No acute or significant osseous findings.

Review of the MIP images confirms the above findings.
IMPRESSION: CTA CHEST

1. Eccentric aneurysmal dilation of the descending thoracic aorta
with a maximal diameter of 5.6 cm.
2. Normal caliber of the aortic root.
3. Ectatic bordering on aneurysmal ascending thoracic aorta with a
maximal diameter of 3.9 cm.
4. Advanced centrilobular pulmonary emphysema.
5. Scattered areas of bronchial impaction and tree-in-bud micro
nodularity superimposed on a diffuse background of mild bronchial
wall thickening likely reflecting chronic bronchitis.

CTA ABD/PELVIS

1. Fusiform aneurysmal dilation of the infrarenal abdominal aorta
with a maximal diameter of 3.5 cm. Recommend follow-up every 2
years. This recommendation follows ACR consensus guidelines: White
Paper of the ACR Incidental Findings Committee II on Vascular
Findings. [HOSPITAL] 6269; [DATE].
2. Cholelithiasis.
3. Colonic diverticular disease without CT evidence of active
inflammation.
4. Additional ancillary findings as above.

Aortic aneurysm NOS (2JTRL-TVM.I); aortic Atherosclerosis
(2JTRL-ATG.G) and Emphysema (2JTRL-EB3.Y).

## 2023-03-14 IMAGING — CT CT CTA ABD/PEL W/CM AND/OR W/O CM
2 of 8 series · 10 of 36 positions shown · non-contrast
Comparison: CT scan of the chest 10/21/2021; 05/23/2019

CLINICAL DATA: Thoracic aortic disease, preoperative planning

EXAM:
CT ANGIOGRAPHY CHEST, ABDOMEN AND PELVIS
TECHNIQUE: Non-contrast CT of the chest was initially obtained.

[Series 4: cta cap aneurysm 2.00 bv36 s3 axial arterial · axial · arterial · 0.82mm/px · z∈[+1381,+1891]mm · 9 of 319 slices shown]
[im 32/319  lung]
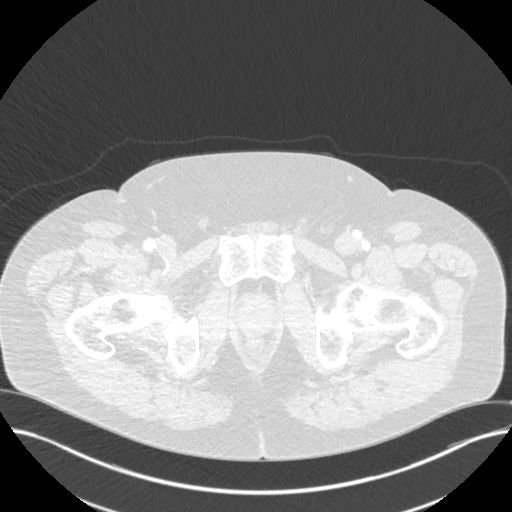
[im 64/319  mediastinal]
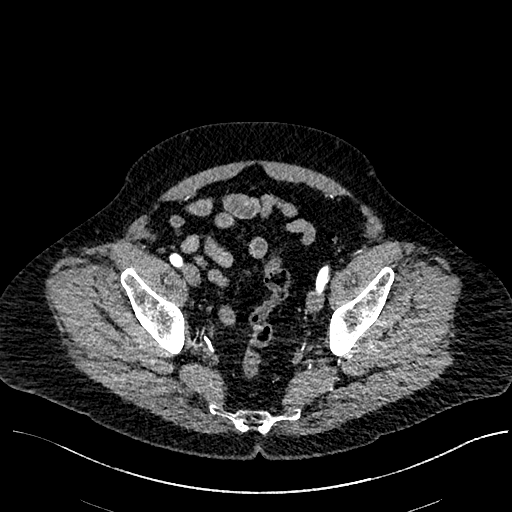
[im 96/319  lung]
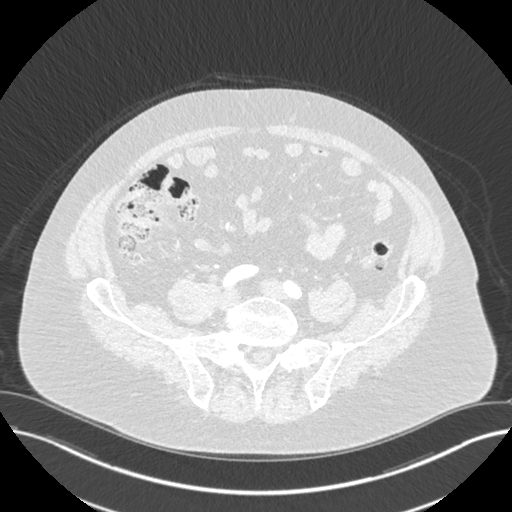
[im 128/319  mediastinal]
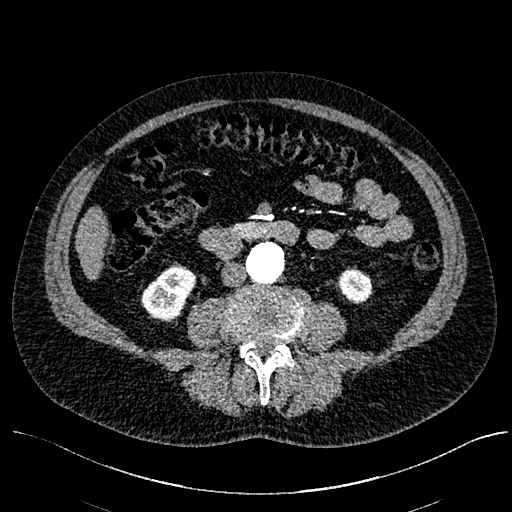
[im 160/319  lung]
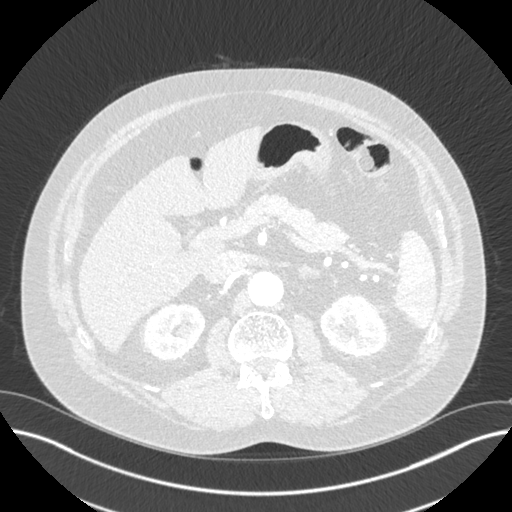
[im 191/319  mediastinal]
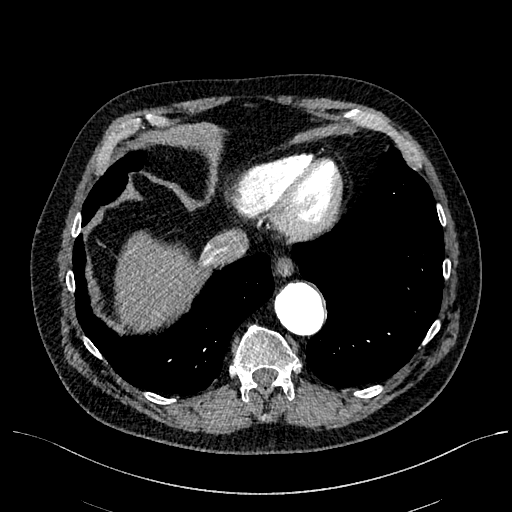
[im 223/319  lung]
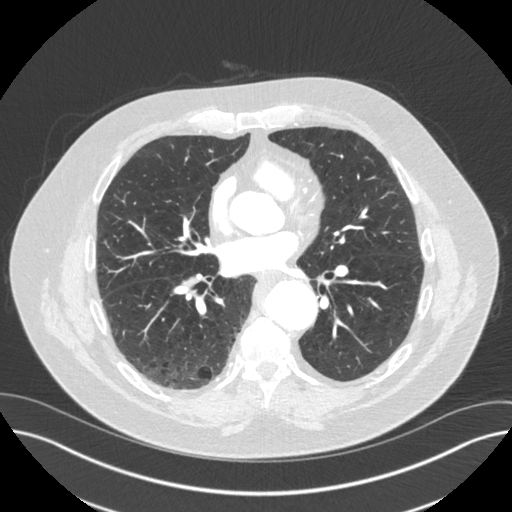
[im 255/319  mediastinal]
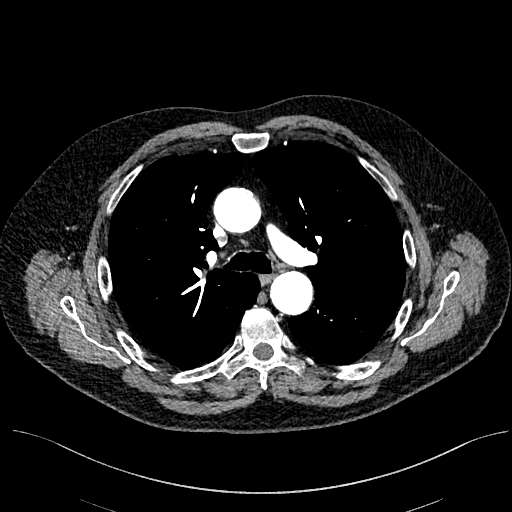
[im 287/319  lung]
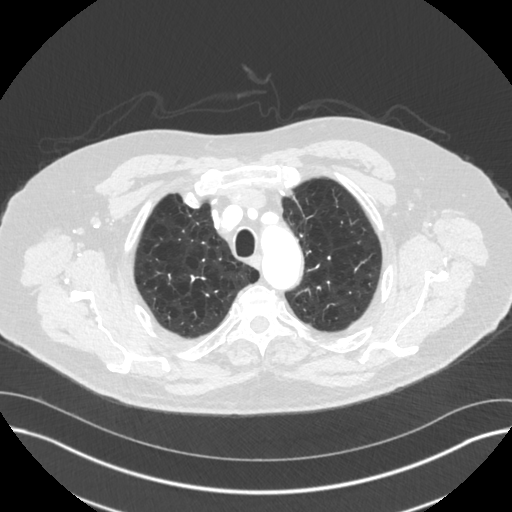

[Series 6: cta cap aneurysm 2.00 bv36 s3 cor st · coronal · 0.82mm/px · 1 of 170 slices shown]
[im 85/170  mediastinal]
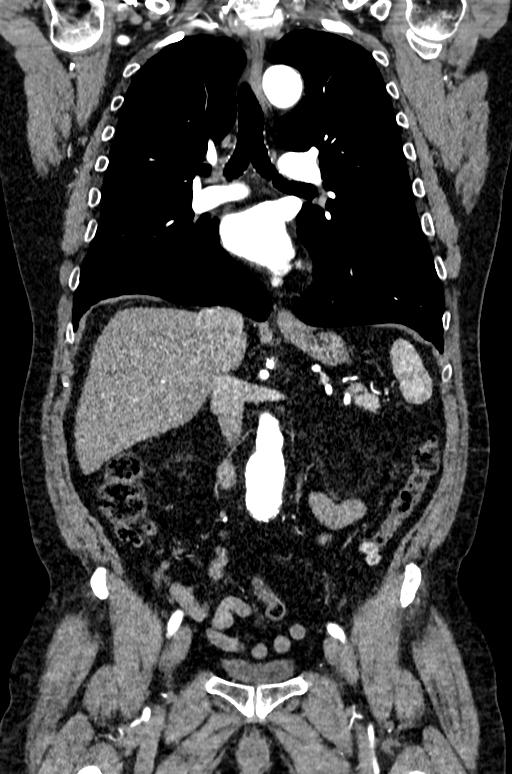

[10 of 36 positions shown; findings below may reference images not displayed]

Multidetector CT imaging through the chest, abdomen and pelvis was
performed using the standard protocol during bolus administration of
intravenous contrast. Multiplanar reconstructed images and MIPs were
obtained and reviewed to evaluate the vascular anatomy.

RADIATION DOSE REDUCTION: This exam was performed according to the
departmental dose-optimization program which includes automated
exposure control, adjustment of the mA and/or kV according to
patient size and/or use of iterative reconstruction technique.

CONTRAST:  75mL U7H3F5-J7T IOPAMIDOL (U7H3F5-J7T) INJECTION 76%
FINDINGS: CTA CHEST FINDINGS

Cardiovascular: The aortic root is normal in caliber at 3.8 cm. No
effacement of the [place] junction. Ectatic bordering on
aneurysmal ascending thoracic aorta with a maximal diameter of
cm. The aortic arch is mildly ectatic at 3.5 cm. The proximal
descending thoracic aorta is ectatic at 3.6 cm. The focal eccentric
aneurysmal dilation of the descending thoracic aorta with a maximal
transverse diameter of 5.4 cm. The more distal thoracic aorta is
both tortuous and mildly aneurysmal with a maximal diameter of
cm. Scattered heterogeneous atherosclerotic plaque. Unremarkable
main pulmonary artery. The heart is normal in size. No pericardial
effusion.

Mediastinum/Nodes: Unremarkable CT appearance of the thyroid gland.
No suspicious mediastinal or hilar adenopathy. No soft tissue
mediastinal mass. The thoracic esophagus is unremarkable.

Lungs/Pleura: Advanced centrilobular pulmonary emphysema. Diffuse
mild bronchial wall thickening. Several areas of focal bronchial
impaction and tree-in-bud micro nodularity are evident in the left
upper and right lower lobe. Findings remain grossly unchanged across
prior studies.

Musculoskeletal: No acute fracture or aggressive appearing lytic or
blastic osseous lesion.

Review of the MIP images confirms the above findings.

CTA ABDOMEN AND PELVIS FINDINGS

VASCULAR

Aorta: Multifocal atherosclerotic plaque. Fusiform aneurysmal
dilation of the infrarenal abdominal aorta with a maximal diameter
of 3.5 cm. No dissection.

Celiac: Patent without evidence of aneurysm, dissection, vasculitis
or significant stenosis.

SMA: Patent without evidence of aneurysm, dissection, vasculitis or
significant stenosis.

Renals: Both renal arteries are patent without evidence of aneurysm,
dissection, vasculitis, fibromuscular dysplasia or significant
stenosis.

IMA: Patent without evidence of aneurysm, dissection, vasculitis or
significant stenosis.

Inflow: Patent without evidence of aneurysm, dissection, vasculitis
or significant stenosis.

Veins: No obvious venous abnormality.

Review of the MIP images confirms the above findings.

NON-VASCULAR

Hepatobiliary: Normal hepatic contour and morphology. No discrete
hepatic lesion. Calcified stones present in the gallbladder lumen.
No evidence of cholecystitis.

Pancreas: Unremarkable. No pancreatic ductal dilatation or
surrounding inflammatory changes.

Spleen: Normal in size without focal abnormality.

Adrenals/Urinary Tract: Adrenal glands are normal. No
hydronephrosis, nephrolithiasis or enhancing renal mass. Small
subcentimeter low-attenuation renal lesions are too small for
accurate characterization but statistically highly likely benign
cysts. Unremarkable ureters and bladder.

Stomach/Bowel: Colonic diverticular disease without CT evidence of
active inflammation. No evidence of obstruction or focal bowel wall
thickening. Normal appendix in the right lower quadrant. The
terminal ileum is unremarkable.

Lymphatic: No suspicious lymphadenopathy.

Reproductive: Prostate is unremarkable.

Other: No abdominal wall hernia or abnormality. No abdominopelvic
ascites.

Musculoskeletal: No acute or significant osseous findings.

Review of the MIP images confirms the above findings.
IMPRESSION: CTA CHEST

1. Eccentric aneurysmal dilation of the descending thoracic aorta
with a maximal diameter of 5.6 cm.
2. Normal caliber of the aortic root.
3. Ectatic bordering on aneurysmal ascending thoracic aorta with a
maximal diameter of 3.9 cm.
4. Advanced centrilobular pulmonary emphysema.
5. Scattered areas of bronchial impaction and tree-in-bud micro
nodularity superimposed on a diffuse background of mild bronchial
wall thickening likely reflecting chronic bronchitis.

CTA ABD/PELVIS

1. Fusiform aneurysmal dilation of the infrarenal abdominal aorta
with a maximal diameter of 3.5 cm. Recommend follow-up every 2
years. This recommendation follows ACR consensus guidelines: White
Paper of the ACR Incidental Findings Committee II on Vascular
Findings. [HOSPITAL] 6269; [DATE].
2. Cholelithiasis.
3. Colonic diverticular disease without CT evidence of active
inflammation.
4. Additional ancillary findings as above.

Aortic aneurysm NOS (2JTRL-TVM.I); aortic Atherosclerosis
(2JTRL-ATG.G) and Emphysema (2JTRL-EB3.Y).

## 2023-03-25 DIAGNOSIS — I2510 Atherosclerotic heart disease of native coronary artery without angina pectoris: Secondary | ICD-10-CM | POA: Diagnosis not present

## 2023-03-25 DIAGNOSIS — Z8679 Personal history of other diseases of the circulatory system: Secondary | ICD-10-CM | POA: Diagnosis not present

## 2023-03-25 DIAGNOSIS — I700 Atherosclerosis of aorta: Secondary | ICD-10-CM | POA: Diagnosis not present

## 2023-03-25 DIAGNOSIS — Z9889 Other specified postprocedural states: Secondary | ICD-10-CM | POA: Diagnosis not present

## 2023-03-25 DIAGNOSIS — Z0000 Encounter for general adult medical examination without abnormal findings: Secondary | ICD-10-CM | POA: Diagnosis not present

## 2023-03-25 DIAGNOSIS — I428 Other cardiomyopathies: Secondary | ICD-10-CM | POA: Diagnosis not present

## 2023-03-25 DIAGNOSIS — E7800 Pure hypercholesterolemia, unspecified: Secondary | ICD-10-CM | POA: Diagnosis not present

## 2023-03-25 DIAGNOSIS — I10 Essential (primary) hypertension: Secondary | ICD-10-CM | POA: Diagnosis not present

## 2023-03-25 DIAGNOSIS — J449 Chronic obstructive pulmonary disease, unspecified: Secondary | ICD-10-CM | POA: Diagnosis not present

## 2023-03-25 DIAGNOSIS — I519 Heart disease, unspecified: Secondary | ICD-10-CM | POA: Diagnosis not present

## 2023-03-29 DIAGNOSIS — Z0000 Encounter for general adult medical examination without abnormal findings: Secondary | ICD-10-CM | POA: Diagnosis not present

## 2023-04-21 DIAGNOSIS — S0501XA Injury of conjunctiva and corneal abrasion without foreign body, right eye, initial encounter: Secondary | ICD-10-CM | POA: Diagnosis not present

## 2023-05-13 DIAGNOSIS — D225 Melanocytic nevi of trunk: Secondary | ICD-10-CM | POA: Diagnosis not present

## 2023-05-13 DIAGNOSIS — L578 Other skin changes due to chronic exposure to nonionizing radiation: Secondary | ICD-10-CM | POA: Diagnosis not present

## 2023-05-13 DIAGNOSIS — D2371 Other benign neoplasm of skin of right lower limb, including hip: Secondary | ICD-10-CM | POA: Diagnosis not present

## 2023-05-13 DIAGNOSIS — L570 Actinic keratosis: Secondary | ICD-10-CM | POA: Diagnosis not present

## 2023-05-13 DIAGNOSIS — D485 Neoplasm of uncertain behavior of skin: Secondary | ICD-10-CM | POA: Diagnosis not present

## 2023-05-13 DIAGNOSIS — C44629 Squamous cell carcinoma of skin of left upper limb, including shoulder: Secondary | ICD-10-CM | POA: Diagnosis not present

## 2023-05-13 DIAGNOSIS — H61001 Unspecified perichondritis of right external ear: Secondary | ICD-10-CM | POA: Diagnosis not present

## 2023-05-13 DIAGNOSIS — C4442 Squamous cell carcinoma of skin of scalp and neck: Secondary | ICD-10-CM | POA: Diagnosis not present

## 2023-06-01 DIAGNOSIS — C44622 Squamous cell carcinoma of skin of right upper limb, including shoulder: Secondary | ICD-10-CM | POA: Diagnosis not present

## 2023-06-10 ENCOUNTER — Telehealth: Payer: Self-pay | Admitting: Cardiovascular Disease

## 2023-06-10 NOTE — Telephone Encounter (Signed)
PT came in asking about his med's and would like you to call him

## 2023-06-11 NOTE — Telephone Encounter (Signed)
Patient is returning call.  °

## 2023-06-11 NOTE — Telephone Encounter (Signed)
Left message for patient to call back  

## 2023-06-11 NOTE — Telephone Encounter (Signed)
Called patient back. Patient is having trouble affording Entresto. Gave patient [employer] number [phone]. Will send message to Larita Fife to help patient. Patient will also need samples.

## 2023-06-14 MED ORDER — ENTRESTO 97-103 MG PO TABS: 1.0000 | ORAL_TABLET | Freq: Two times a day (BID) | ORAL | 6 refills | Status: AC

## 2023-06-14 NOTE — Telephone Encounter (Signed)
I could not find what grant or assistance he was getting but I was able to get him a [employer] grant. Called pt and LVM for him to call back.  Card No. [id number]   Card Status Active   BIN [id number]   PCN [id number]   PC Group [id number]

## 2023-06-14 NOTE — Telephone Encounter (Deleted)
$  141/3 month supply of Entresto is normal copay for Entresto with his Medicare D insurance. I don't know any information about him getting Entresto for free from a pharmacy at his PCP office or a particular pharmacy closing. Nothing is noted in his dispense report for Entresto. He should ask his PCP for details about the program he was in to see if he can continue with it. Agree he can fill out pt assistance paperwork through [employer] if his copay is cost prohibitive, although note doesn't mention that copay is prohibitive, just that he wanted to continue getting med for free. If cost is affordable (again the reported copay is standard for Part D), he should pick up rx from his pharmacy. I called him to discuss, no answer, left message.

## 2023-06-14 NOTE — Telephone Encounter (Signed)
**Note De-Identified Rheta Hemmelgarn Obfuscation** The pt states that his cost is $141 for a 90 day supply of Entresto and that he wants to continue to get it for free. He states that he was getting it through the pharmacy at Dr Venita Sheffield office but that that pharmacy has closed and he was advised to contact his cardiologist office.  He does not know the name of the foundation/program that was providing him his Entresto.  We discussed the [employer] patient assistance foundation and he stated that it does not sound like the program he was enrolled in as that program did not require that a pt be without insurance or in the donut hole.  I gave him [employer] phone number [phone]) and advised him to call them to ask if he is eligible for assistance and that if they advise him that he is, we will provide him a application.  He verbalized understanding and thanked me for calling him back to discuss.

## 2023-06-15 DIAGNOSIS — Z23 Encounter for immunization: Secondary | ICD-10-CM | POA: Diagnosis not present

## 2023-06-16 NOTE — Telephone Encounter (Signed)
Spoke with patient. Gave him grant info and advised he give it to his pharmacy to bill as secondary for his Entresto. Patient appreciative of the help.

## 2023-06-22 DIAGNOSIS — H43813 Vitreous degeneration, bilateral: Secondary | ICD-10-CM | POA: Diagnosis not present

## 2023-06-22 DIAGNOSIS — H524 Presbyopia: Secondary | ICD-10-CM | POA: Diagnosis not present

## 2023-06-23 DIAGNOSIS — C4442 Squamous cell carcinoma of skin of scalp and neck: Secondary | ICD-10-CM | POA: Diagnosis not present

## 2023-07-07 DIAGNOSIS — Z5189 Encounter for other specified aftercare: Secondary | ICD-10-CM | POA: Diagnosis not present

## 2023-07-12 DIAGNOSIS — I5189 Other ill-defined heart diseases: Secondary | ICD-10-CM | POA: Diagnosis not present

## 2023-07-12 DIAGNOSIS — R059 Cough, unspecified: Secondary | ICD-10-CM | POA: Diagnosis not present

## 2023-08-17 DIAGNOSIS — L570 Actinic keratosis: Secondary | ICD-10-CM | POA: Diagnosis not present

## 2023-08-17 DIAGNOSIS — C4442 Squamous cell carcinoma of skin of scalp and neck: Secondary | ICD-10-CM | POA: Diagnosis not present

## 2023-08-17 DIAGNOSIS — C44629 Squamous cell carcinoma of skin of left upper limb, including shoulder: Secondary | ICD-10-CM | POA: Diagnosis not present

## 2023-08-17 DIAGNOSIS — D485 Neoplasm of uncertain behavior of skin: Secondary | ICD-10-CM | POA: Diagnosis not present

## 2023-08-23 NOTE — Progress Notes (Signed)
Cardiology Office Note   Date:  09/06/2023   ID:  Mark Tate, DOB 1951/07/11, MRN [id number]  PCP:  Georgann Housekeeper, MD  Cardiologist:  Dr. Eden Emms     No chief complaint on file.     History of Present Illness: Mark Tate is a 72 y.o. male who presents for f/u of DCM  Hx of CAD on CT of chest. Smoker with 45 pack year history Seen by pulmonary Ramaswamy Rx with spiriva and symbicort Lung cancer screening CT done 05/23/19 no cancer Commented on LM coronary calcification and aortic atherosclerosis with focal dilatation of the descending thoracic aorta 4.3 cm moderate bullous emphysema This was also noted on my review of CT done 03/09/18 The calcification is very mild with no other calcium noted in the coronary arteries to my review   He has no exertional chest pain  Some exertional dyspnea from COPD Has had COVID vaccine Has two daughters from two marriages and 10 grand children. 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    12/21/19 Myoview suggest old infarct  01/01/20 Echo with decreased, EF to 30-35%, G1 DD      Started on coreg and entresto   03/08/20 Cath with pRCA 30 %, mid RCA to dist RCA 20% stenosed, porx LAD to mLAD 20% stenosis, normal Rt and Left heart pressure. 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 and lives in [place]   Past Medical History:  Diagnosis Date   Allergies    Asthma    CAD in native artery 01/03/2020   COPD (chronic obstructive  pulmonary disease) (HCC)    COPD (chronic obstructive pulmonary disease) (HCC) 01/03/2020   Coughing    Fatigue    Neck swelling    Skin cancer    SOB (shortness of breath) on exertion    Tobacco use 01/03/2020   Wheezing     Past Surgical History:  Procedure Laterality Date   BASAL CELL CARCINOMA EXCISION     NECK   basal cell carcinoma excision Right    Right Ear   CATARACT EXTRACTION     MOHS SURGERY  2013, 2019   ON SCALP AND LEFT ARM    RIGHT/LEFT HEART CATH AND CORONARY ANGIOGRAPHY N/A 03/08/2020   Procedure: RIGHT/LEFT HEART CATH AND CORONARY ANGIOGRAPHY;  Surgeon: Kathleene Hazel, MD;  Location: [facility];  Service: Cardiovascular;  Laterality: N/A;   THORACIC AORTIC ENDOVASCULAR STENT GRAFT N/A 12/08/2021   Procedure: THORACIC AORTIC ENDOVASCULAR STENT GRAFT;  Surgeon: Cephus Shelling, MD;  Location: [facility] OR;  Service: Vascular;  Laterality: N/A;     Current Outpatient Medications  Medication Sig Dispense Refill   acetaminophen (TYLENOL) 500 MG tablet Take 500 mg by mouth every 6 (six) hours as needed for headache.      albuterol (PROVENTIL) (2.5 MG/3ML) 0.083% nebulizer solution Take 3 mLs (2.5 mg total) by nebulization every 4 (four) hours as  needed for wheezing or shortness of breath. 75 mL 1   albuterol (VENTOLIN HFA) 108 (90 Base) MCG/ACT inhaler Inhale 1-2 puffs into the lungs every 6 (six) hours as needed for wheezing or shortness of breath. 18 g 3   Ascorbic Acid (VITAMIN C) 1000 MG tablet Take 1,000 mg by mouth daily.     aspirin EC 81 MG tablet Take 1 tablet (81 mg total) by mouth daily. 90 tablet 3   atorvastatin (LIPITOR) 40 MG tablet TAKE 1 TABLET (40 MG TOTAL) BY MOUTH DAILY. 90 tablet 3   BREZTRI AEROSPHERE 160-9-4.8 MCG/ACT AERO Take 2 puffs by mouth in the morning and at bedtime. 10.7 g 1   carvedilol (COREG) 6.25 MG tablet TAKE 0.5 TABLETS (3.125 MG TOTAL) BY MOUTH 2 (TWO) TIMES DAILY WITH A MEAL. 90 tablet 3   cetirizine (ZYRTEC) 10  MG tablet Take 10 mg by mouth daily.     cholecalciferol (VITAMIN D3) 25 MCG (1000 UNIT) tablet Take 1,000 Units by mouth daily.     meclizine (ANTIVERT) 25 MG tablet Take 1 tablet (25 mg total) by mouth 3 (three) times daily as needed for dizziness. 30 tablet 0   Multiple Vitamins-Minerals (CENTRUM SILVER 50+MEN) TABS Take 1 tablet by mouth daily.     sacubitril-valsartan (ENTRESTO) 97-103 MG Take 1 tablet by mouth 2 (two) times daily. 60 tablet 6   Tiotropium Bromide Monohydrate (SPIRIVA RESPIMAT) 2.5 MCG/ACT AERS Inhale 2 puffs into the lungs daily. 4 g 0   No current facility-administered medications for this visit.    Allergies:   Patient has no known allergies.    Social History:  The patient  reports that he quit smoking about 2 years ago. His smoking use included cigarettes. He started smoking about 47 years ago. He has a 90 pack-year smoking history. He has never used smokeless tobacco. He reports that he does not currently use alcohol. He reports that he does not use drugs.   Family History:  The patient's family history includes Cancer in his father and mother; Cancer - Lung in his maternal aunt.    ROS:  General:no colds or fevers, no weight changes Skin:no rashes or ulcers HEENT:no blurred vision, no congestion CV:see HPI PUL:see HPI GI:no diarrhea constipation or melena, no indigestion GU:no hematuria, no dysuria MS:no joint pain, no claudication Neuro:no syncope, no lightheadedness Endo:no diabetes, no thyroid disease  Wt Readings from Last 3 Encounters:  09/06/23 217 lb (98.4 kg)  02/02/23 208 lb (94.3 kg)  12/23/22 211 lb (95.7 kg)     PHYSICAL EXAM: Ht 5\' 11"  (1.803 m)   Wt 217 lb (98.4 kg)   BMI 30.27 kg/m  Affect appropriate Healthy:  appears stated age HEENT: normal Neck supple with no adenopathy JVP normal no bruits no thyromegaly Lungs clear with no wheezing and good diaphragmatic motion Heart:  S1/S2 no murmur, no rub, gallop or click PMI  normal Abdomen: benighn, BS positve, no tenderness, no AAA no bruit.  No HSM or HJR Distal pulses intact with no bruits No edema Neuro non-focal Skin warm and dry No muscular weakness  EKG:   09/06/2023 SR rate 71 normal     Recent Labs: No results found for requested labs within last 365 days.    Lipid Panel    Component Value Date/Time   CHOL 128 02/12/2020 1053   TRIG 246 (H) 02/12/2020 1053   HDL 37 (L) 02/12/2020 1053   CHOLHDL 3.5 02/12/2020 1053   LDLCALC 52 02/12/2020 1053  Other studies Reviewed: Additional studies/ records that were reviewed today include:. Cardiac cath 03/08/20 Prox RCA lesion is 30% stenosed. Mid RCA to Dist RCA lesion is 20% stenosed. Prox LAD to Mid LAD lesion is 20% stenosed.   1. Mild non-obstructive CAD 2. Normal right and left heart pressures.    Recommendations: Medical management of mild non-obstructive CAD.   Echo:  04/22/20 IMPRESSIONS     1. COmpared to echo from April 2021 LVEF is improved.   2. Left ventricular ejection fraction, by estimation, is 60 to 65%. The  left ventricle has normal function. The left ventricle has no regional  wall motion abnormalities. Left ventricular diastolic parameters are  indeterminate.   3. Right ventricular systolic function is normal. The right ventricular  size is normal.   4. The mitral valve is normal in structure. Trivial mitral valve  regurgitation.   5. The aortic valve is normal in structure. Aortic valve regurgitation is  not visualized.   ASSESSMENT AND PLAN:  1.  NICM with EF 30-35%, on coreg 6.25 BID and entresto 97-103  TTE done 03/09/21 EF 60-65% continue medical Rx no indication for AICD Improved Update TTE   2.  Mild nonobstructive CAD.  On cath 03/08/20 continue statin and BB  3.  HLD continue statin  4.  Tobacco use quit 2 years ago improved lungs ok on CTA;s done for aneurysm   5.  COPD/dyspnea stable and followed by pulmonary. Lung cancer screening CT last  10/22/21 new nodules < 4 mm f/u Ramaswamy advanced emphysema with bronchial impaction Continue Breztril and spiriva  6. Descending Thoracic Aneurysm:  noted on CT above 5.5 cm has been seen by Dr Chestine Spore VVS3/13/23 post stent graft Rx Gore TEVAR covering left subclavian CT5/4/24 4.0 cm with TEVAR intact and aneurysm sac resolved Ascending thoracic aorta stable 4.0 cm   7. Vertigo:  f/u primary I did call in some meclizine for him to take PRN   Update TTE for EF   Disposition:   FU:in a year refer to Dr Chestine Spore for f/u VVS  Signed, Charlton Haws, MD  09/06/2023 8:32 AM    [facility] [address] [place], [place], [place]  [address]/ [address] [employer] [employer] [address] [place], [place] Phone: [phone]; Fax: [phone]  [phone]

## 2023-09-06 ENCOUNTER — Encounter: Payer: Self-pay | Admitting: Cardiovascular Disease

## 2023-09-06 ENCOUNTER — Ambulatory Visit: Payer: Medicare Other | Attending: Cardiovascular Disease | Admitting: Cardiovascular Disease

## 2023-09-06 VITALS — Ht 71.0 in | Wt 217.0 lb

## 2023-09-06 DIAGNOSIS — E782 Mixed hyperlipidemia: Secondary | ICD-10-CM

## 2023-09-06 DIAGNOSIS — I428 Other cardiomyopathies: Secondary | ICD-10-CM

## 2023-09-06 DIAGNOSIS — I712 Thoracic aortic aneurysm, without rupture, unspecified: Secondary | ICD-10-CM | POA: Diagnosis not present

## 2023-09-06 MED ORDER — CARVEDILOL 6.25 MG PO TABS: 6.2500 mg | ORAL_TABLET | Freq: Two times a day (BID) | ORAL | 3 refills | Status: DC

## 2023-09-06 NOTE — Addendum Note (Signed)
Addended by: Virl Axe, Florie Carico L on: 09/06/2023 09:10 AM   Modules accepted: Orders

## 2023-09-06 NOTE — Patient Instructions (Addendum)
Medication Instructions:  Your physician recommends that you continue on your current medications as directed. Please refer to the Current Medication list given to you today.  *If you need a refill on your cardiac medications before your next appointment, please call your pharmacy*   Lab Work: None ordered  If you have labs (blood work) drawn today and your tests are completely normal, you will receive your results only by: MyChart Message (if you have MyChart) OR A paper copy in the mail If you have any lab test that is abnormal or we need to change your treatment, we will call you to review the results.   Testing/Procedures: Your physician has requested that you have an echocardiogram. Echocardiography is a painless test that uses sound waves to create images of your heart. It provides your doctor with information about the size and shape of your heart and how well your heart's chambers and valves are working. This procedure takes approximately one hour. There are no restrictions for this procedure. Please do NOT wear cologne, perfume, aftershave, or lotions (deodorant is allowed). Please arrive 15 minutes prior to your appointment time.  Please note: We ask at that you not bring children with you during ultrasound (echo/ vascular) testing. Due to room size and safety concerns, children are not allowed in the ultrasound rooms during exams. Our front office staff cannot provide observation of children in our lobby area while testing is being conducted. An adult accompanying a patient to their appointment will only be allowed in the ultrasound room at the discretion of the ultrasound technician under special circumstances. We apologize for any inconvenience. Follow-Up: At [facility], you and your health needs are our priority.  As part of our continuing mission to provide you with exceptional heart care, we have created designated Provider Care Teams.  These Care Teams include your  primary Cardiologist (physician) and Advanced Practice Providers (APPs -  Physician Assistants and Nurse Practitioners) who all work together to provide you with the care you need, when you need it.  We recommend signing up for the patient portal called "MyChart".  Sign up information is provided on this After Visit Summary.  MyChart is used to connect with patients for Virtual Visits (Telemedicine).  Patients are able to view lab/test results, encounter notes, upcoming appointments, etc.  Non-urgent messages can be sent to your provider as well.   To learn more about what you can do with MyChart, go to [email or web address].    Your next appointment:   1 year(s)  Provider:   Charlton Haws, MD

## 2023-09-07 ENCOUNTER — Telehealth: Payer: Self-pay | Admitting: Cardiovascular Disease

## 2023-09-07 NOTE — Telephone Encounter (Signed)
Patient  was returning call to schedule Echo. Will send message to Misty Stanley to see if she called patient.

## 2023-09-07 NOTE — Telephone Encounter (Signed)
Patient states he was returning call. Please advise ?

## 2023-09-09 NOTE — Telephone Encounter (Signed)
PT came in today saying the office called and would like for Dr. Eden Emms nurse to call him.

## 2023-10-06 DIAGNOSIS — J449 Chronic obstructive pulmonary disease, unspecified: Secondary | ICD-10-CM | POA: Diagnosis not present

## 2023-10-06 DIAGNOSIS — I712 Thoracic aortic aneurysm, without rupture, unspecified: Secondary | ICD-10-CM | POA: Diagnosis not present

## 2023-10-06 DIAGNOSIS — I700 Atherosclerosis of aorta: Secondary | ICD-10-CM | POA: Diagnosis not present

## 2023-10-06 DIAGNOSIS — J309 Allergic rhinitis, unspecified: Secondary | ICD-10-CM | POA: Diagnosis not present

## 2023-10-06 DIAGNOSIS — I10 Essential (primary) hypertension: Secondary | ICD-10-CM | POA: Diagnosis not present

## 2023-10-06 DIAGNOSIS — I428 Other cardiomyopathies: Secondary | ICD-10-CM | POA: Diagnosis not present

## 2023-10-06 DIAGNOSIS — E7800 Pure hypercholesterolemia, unspecified: Secondary | ICD-10-CM | POA: Diagnosis not present

## 2023-10-06 DIAGNOSIS — I519 Heart disease, unspecified: Secondary | ICD-10-CM | POA: Diagnosis not present

## 2023-10-06 DIAGNOSIS — K219 Gastro-esophageal reflux disease without esophagitis: Secondary | ICD-10-CM | POA: Diagnosis not present

## 2023-10-07 DIAGNOSIS — C4442 Squamous cell carcinoma of skin of scalp and neck: Secondary | ICD-10-CM | POA: Diagnosis not present

## 2023-10-12 ENCOUNTER — Ambulatory Visit (HOSPITAL_COMMUNITY): Payer: Medicare Other | Attending: Cardiovascular Disease

## 2023-10-12 DIAGNOSIS — I428 Other cardiomyopathies: Secondary | ICD-10-CM | POA: Insufficient documentation

## 2023-10-12 DIAGNOSIS — I712 Thoracic aortic aneurysm, without rupture, unspecified: Secondary | ICD-10-CM | POA: Insufficient documentation

## 2023-10-12 DIAGNOSIS — E782 Mixed hyperlipidemia: Secondary | ICD-10-CM | POA: Insufficient documentation

## 2023-10-12 LAB — ECHOCARDIOGRAM COMPLETE
Area-P 1/2: 2.06 cm2
S' Lateral: 3.1 cm

## 2023-10-12 MED ORDER — PERFLUTREN LIPID MICROSPHERE
3.0000 mL | INTRAVENOUS | Status: AC | PRN
  Administered 2023-10-12: 3 mL via INTRAVENOUS

## 2023-10-26 DIAGNOSIS — C44629 Squamous cell carcinoma of skin of left upper limb, including shoulder: Secondary | ICD-10-CM | POA: Diagnosis not present

## 2023-10-28 ENCOUNTER — Telehealth: Payer: Self-pay

## 2023-10-28 NOTE — Telephone Encounter (Signed)
Pt walked into the office for Echocardiogram results.  Reviews results from 10/12/23 echo.  Wendall Stade, MD 10/12/2023  3:59 PM EST     Low normal EF no bad valves overall good   Pt had no further questions or concerns.

## 2023-11-26 DIAGNOSIS — D0439 Carcinoma in situ of skin of other parts of face: Secondary | ICD-10-CM | POA: Diagnosis not present

## 2023-11-26 DIAGNOSIS — D044 Carcinoma in situ of skin of scalp and neck: Secondary | ICD-10-CM | POA: Diagnosis not present

## 2023-11-26 DIAGNOSIS — L578 Other skin changes due to chronic exposure to nonionizing radiation: Secondary | ICD-10-CM | POA: Diagnosis not present

## 2023-11-26 DIAGNOSIS — D2371 Other benign neoplasm of skin of right lower limb, including hip: Secondary | ICD-10-CM | POA: Diagnosis not present

## 2023-11-26 DIAGNOSIS — D485 Neoplasm of uncertain behavior of skin: Secondary | ICD-10-CM | POA: Diagnosis not present

## 2023-11-26 DIAGNOSIS — C4442 Squamous cell carcinoma of skin of scalp and neck: Secondary | ICD-10-CM | POA: Diagnosis not present

## 2023-11-26 DIAGNOSIS — D225 Melanocytic nevi of trunk: Secondary | ICD-10-CM | POA: Diagnosis not present

## 2023-11-26 DIAGNOSIS — L570 Actinic keratosis: Secondary | ICD-10-CM | POA: Diagnosis not present

## 2023-11-26 DIAGNOSIS — L820 Inflamed seborrheic keratosis: Secondary | ICD-10-CM | POA: Diagnosis not present

## 2023-11-26 DIAGNOSIS — L821 Other seborrheic keratosis: Secondary | ICD-10-CM | POA: Diagnosis not present

## 2024-01-03 ENCOUNTER — Other Ambulatory Visit: Payer: Self-pay

## 2024-01-03 DIAGNOSIS — L821 Other seborrheic keratosis: Secondary | ICD-10-CM | POA: Diagnosis not present

## 2024-01-03 DIAGNOSIS — D485 Neoplasm of uncertain behavior of skin: Secondary | ICD-10-CM | POA: Diagnosis not present

## 2024-01-03 DIAGNOSIS — L570 Actinic keratosis: Secondary | ICD-10-CM | POA: Diagnosis not present

## 2024-01-03 DIAGNOSIS — C44329 Squamous cell carcinoma of skin of other parts of face: Secondary | ICD-10-CM | POA: Diagnosis not present

## 2024-01-03 DIAGNOSIS — I7123 Aneurysm of the descending thoracic aorta, without rupture: Secondary | ICD-10-CM

## 2024-01-03 DIAGNOSIS — L918 Other hypertrophic disorders of the skin: Secondary | ICD-10-CM | POA: Diagnosis not present

## 2024-01-10 DIAGNOSIS — L570 Actinic keratosis: Secondary | ICD-10-CM | POA: Diagnosis not present

## 2024-01-18 DIAGNOSIS — C44329 Squamous cell carcinoma of skin of other parts of face: Secondary | ICD-10-CM | POA: Diagnosis not present

## 2024-01-18 DIAGNOSIS — L929 Granulomatous disorder of the skin and subcutaneous tissue, unspecified: Secondary | ICD-10-CM | POA: Diagnosis not present

## 2024-01-24 DIAGNOSIS — J441 Chronic obstructive pulmonary disease with (acute) exacerbation: Secondary | ICD-10-CM | POA: Diagnosis not present

## 2024-01-24 DIAGNOSIS — J439 Emphysema, unspecified: Secondary | ICD-10-CM | POA: Diagnosis not present

## 2024-01-24 DIAGNOSIS — R053 Chronic cough: Secondary | ICD-10-CM | POA: Diagnosis not present

## 2024-01-24 DIAGNOSIS — R059 Cough, unspecified: Secondary | ICD-10-CM | POA: Diagnosis not present

## 2024-01-27 ENCOUNTER — Ambulatory Visit
Admission: RE | Admit: 2024-01-27 | Discharge: 2024-01-27 | Disposition: A | Discharge status: Home / Self Care | Source: Ambulatory Visit | Attending: Vascular Surgery | Admitting: Vascular Surgery

## 2024-01-27 DIAGNOSIS — J439 Emphysema, unspecified: Secondary | ICD-10-CM | POA: Diagnosis not present

## 2024-01-27 DIAGNOSIS — R911 Solitary pulmonary nodule: Secondary | ICD-10-CM | POA: Diagnosis not present

## 2024-01-27 DIAGNOSIS — I7123 Aneurysm of the descending thoracic aorta, without rupture: Secondary | ICD-10-CM

## 2024-01-27 DIAGNOSIS — I7143 Infrarenal abdominal aortic aneurysm, without rupture: Secondary | ICD-10-CM | POA: Diagnosis not present

## 2024-01-27 DIAGNOSIS — Z9889 Other specified postprocedural states: Secondary | ICD-10-CM | POA: Diagnosis not present

## 2024-01-27 MED ORDER — IOPAMIDOL (ISOVUE-370) INJECTION 76%
100.0000 mL | Freq: Once | INTRAVENOUS | Status: AC | PRN
Start: 2024-01-27 — End: 2024-01-27
  Administered 2024-01-27: 100 mL via INTRAVENOUS

## 2024-02-03 ENCOUNTER — Other Ambulatory Visit: Payer: Self-pay | Admitting: Cardiovascular Disease

## 2024-02-04 DIAGNOSIS — C4442 Squamous cell carcinoma of skin of scalp and neck: Secondary | ICD-10-CM | POA: Diagnosis not present

## 2024-02-04 DIAGNOSIS — L309 Dermatitis, unspecified: Secondary | ICD-10-CM | POA: Diagnosis not present

## 2024-02-04 DIAGNOSIS — D044 Carcinoma in situ of skin of scalp and neck: Secondary | ICD-10-CM | POA: Diagnosis not present

## 2024-02-08 ENCOUNTER — Ambulatory Visit: Attending: Vascular Surgery | Admitting: Vascular Surgery

## 2024-02-08 ENCOUNTER — Encounter: Payer: Self-pay | Admitting: Vascular Surgery

## 2024-02-08 VITALS — BP 120/86 | HR 81 | Temp 98.4°F | Resp 20 | Ht 71.0 in | Wt 210.1 lb

## 2024-02-08 DIAGNOSIS — I7143 Infrarenal abdominal aortic aneurysm, without rupture: Secondary | ICD-10-CM | POA: Diagnosis not present

## 2024-02-08 DIAGNOSIS — I7123 Aneurysm of the descending thoracic aorta, without rupture: Secondary | ICD-10-CM | POA: Diagnosis not present

## 2024-02-08 NOTE — Progress Notes (Signed)
 Patient name: Mark Tate MRN: [id number] DOB: 1951/05/04 Sex: male  REASON FOR VISIT: 1 year follow-up for surveillance of TEVAR and abdominal aortic aneurysm  HPI: Mark Tate is a 73 y.o. male that presents for 1 year follow-up after TEVAR for 5.8 cm descending thoracic aneurysm on 12/08/2021.  He does have a AAA that we are following as well.  He reports no new issues today.  No new concerns.  Denies any abdominal or back pain.  Past Medical History:  Diagnosis Date   Allergies    Asthma    CAD in native artery 01/03/2020   COPD (chronic obstructive pulmonary disease) (HCC)    COPD (chronic obstructive pulmonary disease) (HCC) 01/03/2020   Coughing    Fatigue    Neck swelling    Skin cancer    SOB (shortness of breath) on exertion    Tobacco use 01/03/2020   Wheezing     Past Surgical History:  Procedure Laterality Date   BASAL CELL CARCINOMA EXCISION     NECK   basal cell carcinoma excision Right    Right Ear   CATARACT EXTRACTION     MOHS SURGERY  2013, 2019   ON SCALP AND LEFT ARM    RIGHT/LEFT HEART CATH AND CORONARY ANGIOGRAPHY N/A 03/08/2020   Procedure: RIGHT/LEFT HEART CATH AND CORONARY ANGIOGRAPHY;  Surgeon: Odie Benne, MD;  Location: [facility];  Service: Cardiovascular;  Laterality: N/A;   THORACIC AORTIC ENDOVASCULAR STENT GRAFT N/A 12/08/2021   Procedure: THORACIC AORTIC ENDOVASCULAR STENT GRAFT;  Surgeon: Young Hensen, MD;  Location: [facility];  Service: Vascular;  Laterality: N/A;    Family History  Problem Relation Age of Onset   Cancer Mother        BONE, LUNG   Cancer Father        COLON, PROSTATE   Cancer - Lung Maternal Aunt     SOCIAL HISTORY: Social History   Tobacco Use   Smoking status: Former    Current packs/day: 0.00    Average packs/day: 2.0 packs/day for 45.0 years (90.0 ttl pk-yrs)    Types: Cigarettes    Start date: 02/1976    Quit date: 02/2021    Years since quitting: 2.9   Smokeless tobacco:  Never  Substance Use Topics   Alcohol use: Not Currently    No Known Allergies  Current Outpatient Medications  Medication Sig Dispense Refill   acetaminophen  (TYLENOL ) 500 MG tablet Take 500 mg by mouth every 6 (six) hours as needed for headache.      albuterol  (PROVENTIL ) (2.5 MG/3ML) 0.083% nebulizer solution Take 3 mLs (2.5 mg total) by nebulization every 4 (four) hours as needed for wheezing or shortness of breath. 75 mL 1   albuterol  (VENTOLIN  HFA) 108 (90 Base) MCG/ACT inhaler Inhale 1-2 puffs into the lungs every 6 (six) hours as needed for wheezing or shortness of breath. 18 g 3   Ascorbic Acid (VITAMIN C) 1000 MG tablet Take 1,000 mg by mouth daily.     aspirin  EC 81 MG tablet Take 1 tablet (81 mg total) by mouth daily. 90 tablet 3   atorvastatin  (LIPITOR) 40 MG tablet TAKE 1 TABLET BY MOUTH EVERY DAY 90 tablet 1   BREZTRI  AEROSPHERE 160-9-4.8 MCG/ACT AERO Take 2 puffs by mouth in the morning and at bedtime. 10.7 g 1   carvedilol  (COREG ) 6.25 MG tablet Take 1 tablet (6.25 mg total) by mouth 2 (two) times daily. 180 tablet 3  cetirizine (ZYRTEC) 10 MG tablet Take 10 mg by mouth daily.     cholecalciferol (VITAMIN D3) 25 MCG (1000 UNIT) tablet Take 1,000 Units by mouth daily.     meclizine  (ANTIVERT ) 25 MG tablet Take 1 tablet (25 mg total) by mouth 3 (three) times daily as needed for dizziness. 30 tablet 0   Multiple Vitamins-Minerals (CENTRUM SILVER 50+MEN) TABS Take 1 tablet by mouth daily.     sacubitril -valsartan  (ENTRESTO ) 97-103 MG Take 1 tablet by mouth 2 (two) times daily. 60 tablet 6   Tiotropium Bromide Monohydrate  (SPIRIVA  RESPIMAT) 2.5 MCG/ACT AERS Inhale 2 puffs into the lungs daily. 4 g 0   No current facility-administered medications for this visit.    REVIEW OF SYSTEMS:  [X]  denotes positive finding, [ ]  denotes negative finding Cardiac  Comments:  Chest pain or chest pressure:    Shortness of breath upon exertion:    Short of breath when lying flat:     Irregular heart rhythm:        Vascular    Pain in calf, thigh, or hip brought on by ambulation:    Pain in feet at night that wakes you up from your sleep:     Blood clot in your veins:    Leg swelling:         Pulmonary    Oxygen  at home:    Productive cough:     Wheezing:         Neurologic    Sudden weakness in arms or legs:     Sudden numbness in arms or legs:     Sudden onset of difficulty speaking or slurred speech:    Temporary loss of vision in one eye:     Problems with dizziness:         Gastrointestinal    Blood in stool:     Vomited blood:         Genitourinary    Burning when urinating:     Blood in urine:        Psychiatric    Major depression:         Hematologic    Bleeding problems:    Problems with blood clotting too easily:        Skin    Rashes or ulcers:        Constitutional    Fever or chills:      PHYSICAL EXAM: There were no vitals filed for this visit.   GENERAL: The patient is a well-nourished male, in no acute distress. The vital signs are documented above. CARDIAC: There is a regular rate and rhythm.  VASCULAR:  Bilateral femoral pulses palpable Bilateral DP pulses palpable PULMONARY: No respiratory distress. ABDOMEN: Soft and non-tender. MUSCULOSKELETAL: There are no major deformities or cyanosis. NEUROLOGIC: No focal weakness or paresthesias are detected. PSYCHIATRIC: The patient has a normal affect.  DATA:   CTA chest abdomen pelvis reviewed from 01/27/2024 with descending thoracic stent graft in good position and resolution of the aneurysm sac.  Stable 4.1 cm AAA.  CTA C/A/P reviewed from 01/30/23 with descending thoracic stent graft in excellent position and resolution of his aneurysm sac now the diameter of stent graft.  He does have a 4 cm abdominal aortic aneurysm.  CTA chest abdomen pelvis reviewed from 01/22/2022 with stent graft in the descending thoracic aorta with no evidence of endoleak and exclusion of the native  aneurysm sac.  There is evidence of a 3.5 cm abdominal aortic aneurysm.  Assessment/Plan:  73 year old male status post TEVAR for a 5.8 cm descending thoracic aortic aneurysm on 12/08/2021 that presents for 1 year follow-up for surveillance of thoracic stent graft and also following AAA.  Essentially has had resolution of his thoracic aneurysm.  There is some mural thrombus in the thoracic stent graft that has been evident on prior scans and we will continue to follow.  Stable 4.1 cm AAA and discussed no indication for surgery on his AAA.  In men we repair AAA greater than 5.5 cm.  I will also refer him to pulmonology for this 13 mm left upper lobe spiculated lung nodule noted on his surveillance CT for aneurysm surveillance.   Young Hensen, MD [facility] Office: [phone]

## 2024-02-14 ENCOUNTER — Encounter: Payer: Self-pay | Admitting: Podiatry

## 2024-02-14 ENCOUNTER — Ambulatory Visit: Admitting: Podiatry

## 2024-02-14 DIAGNOSIS — D2370 Other benign neoplasm of skin of unspecified lower limb, including hip: Secondary | ICD-10-CM | POA: Insufficient documentation

## 2024-02-14 DIAGNOSIS — C44329 Squamous cell carcinoma of skin of other parts of face: Secondary | ICD-10-CM | POA: Insufficient documentation

## 2024-02-14 DIAGNOSIS — B999 Unspecified infectious disease: Secondary | ICD-10-CM | POA: Insufficient documentation

## 2024-02-14 DIAGNOSIS — C44211 Basal cell carcinoma of skin of unspecified ear and external auricular canal: Secondary | ICD-10-CM | POA: Insufficient documentation

## 2024-02-14 DIAGNOSIS — C44222 Squamous cell carcinoma of skin of right ear and external auricular canal: Secondary | ICD-10-CM | POA: Insufficient documentation

## 2024-02-14 DIAGNOSIS — B351 Tinea unguium: Secondary | ICD-10-CM | POA: Diagnosis not present

## 2024-02-14 DIAGNOSIS — L600 Ingrowing nail: Secondary | ICD-10-CM

## 2024-02-14 DIAGNOSIS — Z85828 Personal history of other malignant neoplasm of skin: Secondary | ICD-10-CM | POA: Insufficient documentation

## 2024-02-14 DIAGNOSIS — D225 Melanocytic nevi of trunk: Secondary | ICD-10-CM | POA: Insufficient documentation

## 2024-02-14 NOTE — Patient Instructions (Signed)

## 2024-02-26 DIAGNOSIS — E7800 Pure hypercholesterolemia, unspecified: Secondary | ICD-10-CM | POA: Diagnosis not present

## 2024-02-26 DIAGNOSIS — J449 Chronic obstructive pulmonary disease, unspecified: Secondary | ICD-10-CM | POA: Diagnosis not present

## 2024-02-26 DIAGNOSIS — I428 Other cardiomyopathies: Secondary | ICD-10-CM | POA: Diagnosis not present

## 2024-02-26 DIAGNOSIS — I2510 Atherosclerotic heart disease of native coronary artery without angina pectoris: Secondary | ICD-10-CM | POA: Diagnosis not present

## 2024-03-02 ENCOUNTER — Other Ambulatory Visit: Payer: Self-pay

## 2024-03-02 MED ORDER — CARVEDILOL 6.25 MG PO TABS
6.2500 mg | ORAL_TABLET | Freq: Two times a day (BID) | ORAL | 1 refills | Status: AC
Start: 2024-03-02 — End: ?

## 2024-03-09 NOTE — Progress Notes (Signed)
 Subjective:   Patient ID: Mark Tate, male   DOB: 73 y.o.   MRN: [id number]   HPI Patient states he is having a lot of pain on his big toenail and states that the corner has been sore and hard for him to take care of.  States that he has tried to trim and soak it without relief of pain   Review of Systems  All other systems reviewed and are negative.       Objective:  Physical Exam Vitals and nursing note reviewed.  Constitutional:      Appearance: He is well-developed.  Pulmonary:     Effort: Pulmonary effort is normal.   Musculoskeletal:        General: Normal range of motion.   Skin:    General: Skin is warm.   Neurological:     Mental Status: He is alert.     Neurovascular status intact muscle strength found to be adequate range of motion adequate with patient found to have incurvation and pain of the medial border left big toe painful when pressed inability to take care of himself no indication of drainage currently     Assessment:  Chronic ingrown toenail deformity left hallux medial border with pain     Plan:  H&P reviewed recommended correction of deformity explained procedure risk patient read and signed consent form and I infiltrated the left big toe 60 mg like Marcaine mixture sterile prep done using sterile instrumentation remove the medial border exposed matrix applied phenol 3 applications 30 seconds followed by alcohol lavage sterile dressing gave instructions on soaks wear dressing 24 hours take off earlier if throbbing were to occur

## 2024-03-28 DIAGNOSIS — Z9889 Other specified postprocedural states: Secondary | ICD-10-CM | POA: Diagnosis not present

## 2024-03-28 DIAGNOSIS — I712 Thoracic aortic aneurysm, without rupture, unspecified: Secondary | ICD-10-CM | POA: Diagnosis not present

## 2024-03-28 DIAGNOSIS — I428 Other cardiomyopathies: Secondary | ICD-10-CM | POA: Diagnosis not present

## 2024-03-28 DIAGNOSIS — I714 Abdominal aortic aneurysm, without rupture, unspecified: Secondary | ICD-10-CM | POA: Diagnosis not present

## 2024-03-28 DIAGNOSIS — E7800 Pure hypercholesterolemia, unspecified: Secondary | ICD-10-CM | POA: Diagnosis not present

## 2024-03-28 DIAGNOSIS — I519 Heart disease, unspecified: Secondary | ICD-10-CM | POA: Diagnosis not present

## 2024-03-28 DIAGNOSIS — C4490 Unspecified malignant neoplasm of skin, unspecified: Secondary | ICD-10-CM | POA: Diagnosis not present

## 2024-03-28 DIAGNOSIS — J449 Chronic obstructive pulmonary disease, unspecified: Secondary | ICD-10-CM | POA: Diagnosis not present

## 2024-03-28 DIAGNOSIS — Z0000 Encounter for general adult medical examination without abnormal findings: Secondary | ICD-10-CM | POA: Diagnosis not present

## 2024-03-28 DIAGNOSIS — I700 Atherosclerosis of aorta: Secondary | ICD-10-CM | POA: Diagnosis not present

## 2024-03-28 DIAGNOSIS — I10 Essential (primary) hypertension: Secondary | ICD-10-CM | POA: Diagnosis not present

## 2024-03-28 DIAGNOSIS — I2510 Atherosclerotic heart disease of native coronary artery without angina pectoris: Secondary | ICD-10-CM | POA: Diagnosis not present

## 2024-03-28 DIAGNOSIS — Z23 Encounter for immunization: Secondary | ICD-10-CM | POA: Diagnosis not present

## 2024-04-18 DIAGNOSIS — J449 Chronic obstructive pulmonary disease, unspecified: Secondary | ICD-10-CM | POA: Diagnosis not present

## 2024-04-18 DIAGNOSIS — R059 Cough, unspecified: Secondary | ICD-10-CM | POA: Diagnosis not present

## 2024-04-18 DIAGNOSIS — J309 Allergic rhinitis, unspecified: Secondary | ICD-10-CM | POA: Diagnosis not present

## 2024-04-27 DIAGNOSIS — I428 Other cardiomyopathies: Secondary | ICD-10-CM | POA: Diagnosis not present

## 2024-04-27 DIAGNOSIS — E7800 Pure hypercholesterolemia, unspecified: Secondary | ICD-10-CM | POA: Diagnosis not present

## 2024-04-27 DIAGNOSIS — J449 Chronic obstructive pulmonary disease, unspecified: Secondary | ICD-10-CM | POA: Diagnosis not present

## 2024-04-27 DIAGNOSIS — I2510 Atherosclerotic heart disease of native coronary artery without angina pectoris: Secondary | ICD-10-CM | POA: Diagnosis not present

## 2024-05-23 DIAGNOSIS — R059 Cough, unspecified: Secondary | ICD-10-CM | POA: Diagnosis not present

## 2024-05-23 DIAGNOSIS — K219 Gastro-esophageal reflux disease without esophagitis: Secondary | ICD-10-CM | POA: Diagnosis not present

## 2024-05-24 DIAGNOSIS — D225 Melanocytic nevi of trunk: Secondary | ICD-10-CM | POA: Diagnosis not present

## 2024-05-24 DIAGNOSIS — D485 Neoplasm of uncertain behavior of skin: Secondary | ICD-10-CM | POA: Diagnosis not present

## 2024-05-24 DIAGNOSIS — L578 Other skin changes due to chronic exposure to nonionizing radiation: Secondary | ICD-10-CM | POA: Diagnosis not present

## 2024-05-24 DIAGNOSIS — D0462 Carcinoma in situ of skin of left upper limb, including shoulder: Secondary | ICD-10-CM | POA: Diagnosis not present

## 2024-05-24 DIAGNOSIS — D044 Carcinoma in situ of skin of scalp and neck: Secondary | ICD-10-CM | POA: Diagnosis not present

## 2024-05-24 DIAGNOSIS — D0439 Carcinoma in situ of skin of other parts of face: Secondary | ICD-10-CM | POA: Diagnosis not present

## 2024-05-24 DIAGNOSIS — L821 Other seborrheic keratosis: Secondary | ICD-10-CM | POA: Diagnosis not present

## 2024-05-24 DIAGNOSIS — D2371 Other benign neoplasm of skin of right lower limb, including hip: Secondary | ICD-10-CM | POA: Diagnosis not present

## 2024-05-24 DIAGNOSIS — L570 Actinic keratosis: Secondary | ICD-10-CM | POA: Diagnosis not present

## 2024-05-28 DIAGNOSIS — E7800 Pure hypercholesterolemia, unspecified: Secondary | ICD-10-CM | POA: Diagnosis not present

## 2024-05-28 DIAGNOSIS — I428 Other cardiomyopathies: Secondary | ICD-10-CM | POA: Diagnosis not present

## 2024-05-28 DIAGNOSIS — J449 Chronic obstructive pulmonary disease, unspecified: Secondary | ICD-10-CM | POA: Diagnosis not present

## 2024-05-28 DIAGNOSIS — I2510 Atherosclerotic heart disease of native coronary artery without angina pectoris: Secondary | ICD-10-CM | POA: Diagnosis not present

## 2024-06-27 DIAGNOSIS — I2510 Atherosclerotic heart disease of native coronary artery without angina pectoris: Secondary | ICD-10-CM | POA: Diagnosis not present

## 2024-06-27 DIAGNOSIS — J449 Chronic obstructive pulmonary disease, unspecified: Secondary | ICD-10-CM | POA: Diagnosis not present

## 2024-06-27 DIAGNOSIS — E7800 Pure hypercholesterolemia, unspecified: Secondary | ICD-10-CM | POA: Diagnosis not present

## 2024-06-27 DIAGNOSIS — I428 Other cardiomyopathies: Secondary | ICD-10-CM | POA: Diagnosis not present

## 2024-06-28 DIAGNOSIS — C4442 Squamous cell carcinoma of skin of scalp and neck: Secondary | ICD-10-CM | POA: Diagnosis not present

## 2024-07-11 DIAGNOSIS — C44629 Squamous cell carcinoma of skin of left upper limb, including shoulder: Secondary | ICD-10-CM | POA: Diagnosis not present

## 2024-07-11 DIAGNOSIS — C44329 Squamous cell carcinoma of skin of other parts of face: Secondary | ICD-10-CM | POA: Diagnosis not present

## 2024-07-24 ENCOUNTER — Other Ambulatory Visit: Payer: Self-pay

## 2024-07-25 MED ORDER — ATORVASTATIN CALCIUM 40 MG PO TABS
40.0000 mg | ORAL_TABLET | Freq: Every day | ORAL | 0 refills | Status: AC
Start: 2024-07-25 — End: ?

## 2024-09-13 ENCOUNTER — Other Ambulatory Visit: Payer: Self-pay | Admitting: Cardiovascular Disease

## 2024-10-12 ENCOUNTER — Other Ambulatory Visit: Payer: Self-pay | Admitting: Cardiovascular Disease

## 2024-10-12 NOTE — Telephone Encounter (Signed)
 In accordance with refill protocols, please review and address the following requirements before this medication refill can be authorized:  Labs

## 2024-10-21 ENCOUNTER — Other Ambulatory Visit: Payer: Self-pay | Admitting: Cardiovascular Disease
